# Patient Record
Sex: Male | Born: 1940
Health system: Southern US, Community
[De-identification: ages and names within clinical notes are randomized; demographics above are authoritative.]

## PROBLEM LIST (undated history)

## (undated) DIAGNOSIS — E785 Hyperlipidemia, unspecified: Secondary | ICD-10-CM

## (undated) DIAGNOSIS — I10 Essential (primary) hypertension: Secondary | ICD-10-CM

## (undated) DIAGNOSIS — E119 Type 2 diabetes mellitus without complications: Secondary | ICD-10-CM

## (undated) DIAGNOSIS — E559 Vitamin D deficiency, unspecified: Secondary | ICD-10-CM

## (undated) DIAGNOSIS — N200 Calculus of kidney: Secondary | ICD-10-CM

## (undated) DIAGNOSIS — H269 Unspecified cataract: Secondary | ICD-10-CM

## (undated) DIAGNOSIS — K8020 Calculus of gallbladder without cholecystitis without obstruction: Secondary | ICD-10-CM

## (undated) DIAGNOSIS — N400 Enlarged prostate without lower urinary tract symptoms: Secondary | ICD-10-CM

## (undated) DIAGNOSIS — M1990 Unspecified osteoarthritis, unspecified site: Secondary | ICD-10-CM

## (undated) HISTORY — DX: Hyperlipidemia, unspecified: E78.5

## (undated) HISTORY — PX: CATARACT EXTRACTION, BILATERAL: SHX1313

## (undated) HISTORY — DX: Type 2 diabetes mellitus without complications: E11.9

## (undated) HISTORY — DX: Unspecified cataract: H26.9

## (undated) HISTORY — DX: Calculus of gallbladder without cholecystitis without obstruction: K80.20

## (undated) HISTORY — DX: Essential (primary) hypertension: I10

## (undated) HISTORY — DX: Benign prostatic hyperplasia without lower urinary tract symptoms: N40.0

## (undated) HISTORY — DX: Unspecified osteoarthritis, unspecified site: M19.90

## (undated) HISTORY — DX: Vitamin D deficiency, unspecified: E55.9

---

## 2004-04-03 ENCOUNTER — Ambulatory Visit (HOSPITAL_COMMUNITY): Admission: RE | Admit: 2004-04-03 | Discharge: 2004-04-03 | Payer: Self-pay | Admitting: Family Medicine

## 2004-05-01 ENCOUNTER — Ambulatory Visit (HOSPITAL_BASED_OUTPATIENT_CLINIC_OR_DEPARTMENT_OTHER): Admission: RE | Admit: 2004-05-01 | Discharge: 2004-05-01 | Payer: Self-pay | Admitting: Orthopaedic Surgery

## 2004-05-01 ENCOUNTER — Ambulatory Visit (HOSPITAL_COMMUNITY): Admission: RE | Admit: 2004-05-01 | Discharge: 2004-05-01 | Payer: Self-pay | Admitting: Orthopaedic Surgery

## 2005-06-25 ENCOUNTER — Encounter: Admission: RE | Admit: 2005-06-25 | Discharge: 2005-07-18 | Payer: Self-pay | Admitting: Orthopaedic Surgery

## 2006-05-13 HISTORY — PX: FOOT SURGERY: SHX648

## 2010-12-03 ENCOUNTER — Encounter (INDEPENDENT_AMBULATORY_CARE_PROVIDER_SITE_OTHER): Payer: Self-pay | Admitting: Ophthalmology

## 2010-12-14 ENCOUNTER — Encounter (INDEPENDENT_AMBULATORY_CARE_PROVIDER_SITE_OTHER): Payer: Medicare Other | Admitting: Ophthalmology

## 2010-12-14 DIAGNOSIS — E11311 Type 2 diabetes mellitus with unspecified diabetic retinopathy with macular edema: Secondary | ICD-10-CM

## 2010-12-14 DIAGNOSIS — E11359 Type 2 diabetes mellitus with proliferative diabetic retinopathy without macular edema: Secondary | ICD-10-CM

## 2010-12-14 DIAGNOSIS — H43819 Vitreous degeneration, unspecified eye: Secondary | ICD-10-CM

## 2011-01-25 ENCOUNTER — Encounter (INDEPENDENT_AMBULATORY_CARE_PROVIDER_SITE_OTHER): Payer: Medicare Other | Admitting: Ophthalmology

## 2011-01-25 DIAGNOSIS — E11311 Type 2 diabetes mellitus with unspecified diabetic retinopathy with macular edema: Secondary | ICD-10-CM

## 2011-01-25 DIAGNOSIS — H43819 Vitreous degeneration, unspecified eye: Secondary | ICD-10-CM

## 2011-01-25 DIAGNOSIS — E11359 Type 2 diabetes mellitus with proliferative diabetic retinopathy without macular edema: Secondary | ICD-10-CM

## 2011-03-08 ENCOUNTER — Encounter (INDEPENDENT_AMBULATORY_CARE_PROVIDER_SITE_OTHER): Payer: Medicare Other | Admitting: Ophthalmology

## 2011-03-12 ENCOUNTER — Encounter (INDEPENDENT_AMBULATORY_CARE_PROVIDER_SITE_OTHER): Payer: Medicare Other | Admitting: Ophthalmology

## 2011-03-12 DIAGNOSIS — H43819 Vitreous degeneration, unspecified eye: Secondary | ICD-10-CM

## 2011-03-12 DIAGNOSIS — E11359 Type 2 diabetes mellitus with proliferative diabetic retinopathy without macular edema: Secondary | ICD-10-CM

## 2011-03-12 DIAGNOSIS — E11311 Type 2 diabetes mellitus with unspecified diabetic retinopathy with macular edema: Secondary | ICD-10-CM

## 2011-03-13 ENCOUNTER — Encounter (INDEPENDENT_AMBULATORY_CARE_PROVIDER_SITE_OTHER): Payer: Medicare Other | Admitting: Ophthalmology

## 2011-04-15 ENCOUNTER — Encounter (INDEPENDENT_AMBULATORY_CARE_PROVIDER_SITE_OTHER): Payer: Medicare Other | Admitting: Ophthalmology

## 2011-04-15 DIAGNOSIS — H43819 Vitreous degeneration, unspecified eye: Secondary | ICD-10-CM

## 2011-04-15 DIAGNOSIS — E1139 Type 2 diabetes mellitus with other diabetic ophthalmic complication: Secondary | ICD-10-CM

## 2011-04-15 DIAGNOSIS — E11311 Type 2 diabetes mellitus with unspecified diabetic retinopathy with macular edema: Secondary | ICD-10-CM

## 2011-04-15 DIAGNOSIS — E11359 Type 2 diabetes mellitus with proliferative diabetic retinopathy without macular edema: Secondary | ICD-10-CM

## 2011-04-15 DIAGNOSIS — E1165 Type 2 diabetes mellitus with hyperglycemia: Secondary | ICD-10-CM

## 2011-05-27 ENCOUNTER — Encounter (INDEPENDENT_AMBULATORY_CARE_PROVIDER_SITE_OTHER): Payer: Medicare Other | Admitting: Ophthalmology

## 2011-06-10 ENCOUNTER — Encounter (INDEPENDENT_AMBULATORY_CARE_PROVIDER_SITE_OTHER): Payer: Medicare Other | Admitting: Ophthalmology

## 2011-06-10 DIAGNOSIS — E1165 Type 2 diabetes mellitus with hyperglycemia: Secondary | ICD-10-CM

## 2011-06-10 DIAGNOSIS — E11359 Type 2 diabetes mellitus with proliferative diabetic retinopathy without macular edema: Secondary | ICD-10-CM

## 2011-06-10 DIAGNOSIS — H43819 Vitreous degeneration, unspecified eye: Secondary | ICD-10-CM

## 2011-06-10 DIAGNOSIS — E11311 Type 2 diabetes mellitus with unspecified diabetic retinopathy with macular edema: Secondary | ICD-10-CM

## 2011-06-10 DIAGNOSIS — E1139 Type 2 diabetes mellitus with other diabetic ophthalmic complication: Secondary | ICD-10-CM

## 2011-06-10 DIAGNOSIS — H2510 Age-related nuclear cataract, unspecified eye: Secondary | ICD-10-CM

## 2011-07-22 ENCOUNTER — Encounter (INDEPENDENT_AMBULATORY_CARE_PROVIDER_SITE_OTHER): Payer: Medicare Other | Admitting: Ophthalmology

## 2011-07-22 DIAGNOSIS — H43819 Vitreous degeneration, unspecified eye: Secondary | ICD-10-CM

## 2011-07-22 DIAGNOSIS — E11359 Type 2 diabetes mellitus with proliferative diabetic retinopathy without macular edema: Secondary | ICD-10-CM

## 2011-07-22 DIAGNOSIS — E11311 Type 2 diabetes mellitus with unspecified diabetic retinopathy with macular edema: Secondary | ICD-10-CM

## 2011-07-22 DIAGNOSIS — E1139 Type 2 diabetes mellitus with other diabetic ophthalmic complication: Secondary | ICD-10-CM

## 2011-08-26 ENCOUNTER — Encounter (INDEPENDENT_AMBULATORY_CARE_PROVIDER_SITE_OTHER): Payer: Medicare Other | Admitting: Ophthalmology

## 2011-08-26 DIAGNOSIS — E11311 Type 2 diabetes mellitus with unspecified diabetic retinopathy with macular edema: Secondary | ICD-10-CM

## 2011-08-26 DIAGNOSIS — E11359 Type 2 diabetes mellitus with proliferative diabetic retinopathy without macular edema: Secondary | ICD-10-CM

## 2011-08-26 DIAGNOSIS — E1165 Type 2 diabetes mellitus with hyperglycemia: Secondary | ICD-10-CM

## 2011-08-26 DIAGNOSIS — E1139 Type 2 diabetes mellitus with other diabetic ophthalmic complication: Secondary | ICD-10-CM

## 2011-08-26 DIAGNOSIS — H43819 Vitreous degeneration, unspecified eye: Secondary | ICD-10-CM

## 2011-08-26 DIAGNOSIS — H2510 Age-related nuclear cataract, unspecified eye: Secondary | ICD-10-CM

## 2011-10-14 ENCOUNTER — Encounter (INDEPENDENT_AMBULATORY_CARE_PROVIDER_SITE_OTHER): Payer: Medicare Other | Admitting: Ophthalmology

## 2011-10-14 DIAGNOSIS — E1165 Type 2 diabetes mellitus with hyperglycemia: Secondary | ICD-10-CM

## 2011-10-14 DIAGNOSIS — H43819 Vitreous degeneration, unspecified eye: Secondary | ICD-10-CM

## 2011-10-14 DIAGNOSIS — E11359 Type 2 diabetes mellitus with proliferative diabetic retinopathy without macular edema: Secondary | ICD-10-CM

## 2011-10-14 DIAGNOSIS — E1139 Type 2 diabetes mellitus with other diabetic ophthalmic complication: Secondary | ICD-10-CM

## 2011-11-25 ENCOUNTER — Encounter (INDEPENDENT_AMBULATORY_CARE_PROVIDER_SITE_OTHER): Payer: Medicare Other | Admitting: Ophthalmology

## 2011-11-25 DIAGNOSIS — E1139 Type 2 diabetes mellitus with other diabetic ophthalmic complication: Secondary | ICD-10-CM

## 2011-11-25 DIAGNOSIS — E11359 Type 2 diabetes mellitus with proliferative diabetic retinopathy without macular edema: Secondary | ICD-10-CM

## 2011-11-25 DIAGNOSIS — H35039 Hypertensive retinopathy, unspecified eye: Secondary | ICD-10-CM

## 2011-11-25 DIAGNOSIS — H3581 Retinal edema: Secondary | ICD-10-CM

## 2011-11-25 DIAGNOSIS — H43819 Vitreous degeneration, unspecified eye: Secondary | ICD-10-CM

## 2011-11-25 DIAGNOSIS — E1165 Type 2 diabetes mellitus with hyperglycemia: Secondary | ICD-10-CM

## 2011-11-25 DIAGNOSIS — I10 Essential (primary) hypertension: Secondary | ICD-10-CM

## 2012-01-27 ENCOUNTER — Encounter (INDEPENDENT_AMBULATORY_CARE_PROVIDER_SITE_OTHER): Payer: Medicare Other | Admitting: Ophthalmology

## 2012-01-27 DIAGNOSIS — H3581 Retinal edema: Secondary | ICD-10-CM

## 2012-01-27 DIAGNOSIS — H43819 Vitreous degeneration, unspecified eye: Secondary | ICD-10-CM

## 2012-01-27 DIAGNOSIS — H35039 Hypertensive retinopathy, unspecified eye: Secondary | ICD-10-CM

## 2012-01-27 DIAGNOSIS — E1139 Type 2 diabetes mellitus with other diabetic ophthalmic complication: Secondary | ICD-10-CM

## 2012-01-27 DIAGNOSIS — E11359 Type 2 diabetes mellitus with proliferative diabetic retinopathy without macular edema: Secondary | ICD-10-CM

## 2012-01-27 DIAGNOSIS — I10 Essential (primary) hypertension: Secondary | ICD-10-CM

## 2012-03-30 ENCOUNTER — Encounter (INDEPENDENT_AMBULATORY_CARE_PROVIDER_SITE_OTHER): Payer: Medicare Other | Admitting: Ophthalmology

## 2012-03-30 DIAGNOSIS — H43819 Vitreous degeneration, unspecified eye: Secondary | ICD-10-CM

## 2012-03-30 DIAGNOSIS — H35039 Hypertensive retinopathy, unspecified eye: Secondary | ICD-10-CM

## 2012-03-30 DIAGNOSIS — H3581 Retinal edema: Secondary | ICD-10-CM

## 2012-03-30 DIAGNOSIS — E1139 Type 2 diabetes mellitus with other diabetic ophthalmic complication: Secondary | ICD-10-CM

## 2012-03-30 DIAGNOSIS — I10 Essential (primary) hypertension: Secondary | ICD-10-CM

## 2012-03-30 DIAGNOSIS — E11359 Type 2 diabetes mellitus with proliferative diabetic retinopathy without macular edema: Secondary | ICD-10-CM

## 2012-06-02 ENCOUNTER — Encounter (INDEPENDENT_AMBULATORY_CARE_PROVIDER_SITE_OTHER): Payer: Medicare Other | Admitting: Ophthalmology

## 2012-06-02 DIAGNOSIS — H35039 Hypertensive retinopathy, unspecified eye: Secondary | ICD-10-CM

## 2012-06-02 DIAGNOSIS — I10 Essential (primary) hypertension: Secondary | ICD-10-CM

## 2012-06-02 DIAGNOSIS — E11359 Type 2 diabetes mellitus with proliferative diabetic retinopathy without macular edema: Secondary | ICD-10-CM

## 2012-06-02 DIAGNOSIS — E1139 Type 2 diabetes mellitus with other diabetic ophthalmic complication: Secondary | ICD-10-CM

## 2012-06-02 DIAGNOSIS — H43819 Vitreous degeneration, unspecified eye: Secondary | ICD-10-CM

## 2012-06-02 DIAGNOSIS — E1165 Type 2 diabetes mellitus with hyperglycemia: Secondary | ICD-10-CM

## 2012-06-02 DIAGNOSIS — H3581 Retinal edema: Secondary | ICD-10-CM

## 2012-06-30 ENCOUNTER — Encounter (INDEPENDENT_AMBULATORY_CARE_PROVIDER_SITE_OTHER): Payer: Medicare Other | Admitting: Ophthalmology

## 2012-06-30 DIAGNOSIS — I10 Essential (primary) hypertension: Secondary | ICD-10-CM

## 2012-06-30 DIAGNOSIS — H43819 Vitreous degeneration, unspecified eye: Secondary | ICD-10-CM

## 2012-06-30 DIAGNOSIS — E1139 Type 2 diabetes mellitus with other diabetic ophthalmic complication: Secondary | ICD-10-CM

## 2012-06-30 DIAGNOSIS — H35039 Hypertensive retinopathy, unspecified eye: Secondary | ICD-10-CM

## 2012-06-30 DIAGNOSIS — E11359 Type 2 diabetes mellitus with proliferative diabetic retinopathy without macular edema: Secondary | ICD-10-CM

## 2012-06-30 DIAGNOSIS — E11311 Type 2 diabetes mellitus with unspecified diabetic retinopathy with macular edema: Secondary | ICD-10-CM

## 2012-09-01 ENCOUNTER — Encounter (INDEPENDENT_AMBULATORY_CARE_PROVIDER_SITE_OTHER): Payer: Medicare Other | Admitting: Ophthalmology

## 2012-09-01 DIAGNOSIS — E11359 Type 2 diabetes mellitus with proliferative diabetic retinopathy without macular edema: Secondary | ICD-10-CM

## 2012-09-01 DIAGNOSIS — H43819 Vitreous degeneration, unspecified eye: Secondary | ICD-10-CM

## 2012-09-01 DIAGNOSIS — E11311 Type 2 diabetes mellitus with unspecified diabetic retinopathy with macular edema: Secondary | ICD-10-CM

## 2012-09-01 DIAGNOSIS — H35039 Hypertensive retinopathy, unspecified eye: Secondary | ICD-10-CM

## 2012-09-01 DIAGNOSIS — I10 Essential (primary) hypertension: Secondary | ICD-10-CM

## 2012-09-01 DIAGNOSIS — E1139 Type 2 diabetes mellitus with other diabetic ophthalmic complication: Secondary | ICD-10-CM

## 2012-10-13 ENCOUNTER — Encounter (INDEPENDENT_AMBULATORY_CARE_PROVIDER_SITE_OTHER): Payer: Medicare Other | Admitting: Ophthalmology

## 2012-10-13 DIAGNOSIS — E11319 Type 2 diabetes mellitus with unspecified diabetic retinopathy without macular edema: Secondary | ICD-10-CM

## 2012-10-13 DIAGNOSIS — E1139 Type 2 diabetes mellitus with other diabetic ophthalmic complication: Secondary | ICD-10-CM

## 2012-10-13 DIAGNOSIS — I10 Essential (primary) hypertension: Secondary | ICD-10-CM

## 2012-10-13 DIAGNOSIS — E11311 Type 2 diabetes mellitus with unspecified diabetic retinopathy with macular edema: Secondary | ICD-10-CM

## 2012-10-13 DIAGNOSIS — H35039 Hypertensive retinopathy, unspecified eye: Secondary | ICD-10-CM

## 2012-10-13 DIAGNOSIS — H43819 Vitreous degeneration, unspecified eye: Secondary | ICD-10-CM

## 2012-11-20 ENCOUNTER — Encounter (INDEPENDENT_AMBULATORY_CARE_PROVIDER_SITE_OTHER): Payer: Medicare Other | Admitting: Ophthalmology

## 2012-11-20 DIAGNOSIS — E11359 Type 2 diabetes mellitus with proliferative diabetic retinopathy without macular edema: Secondary | ICD-10-CM

## 2012-11-20 DIAGNOSIS — E11311 Type 2 diabetes mellitus with unspecified diabetic retinopathy with macular edema: Secondary | ICD-10-CM

## 2012-11-20 DIAGNOSIS — H35039 Hypertensive retinopathy, unspecified eye: Secondary | ICD-10-CM

## 2012-11-20 DIAGNOSIS — H43819 Vitreous degeneration, unspecified eye: Secondary | ICD-10-CM

## 2012-11-20 DIAGNOSIS — I10 Essential (primary) hypertension: Secondary | ICD-10-CM

## 2012-11-20 DIAGNOSIS — E1139 Type 2 diabetes mellitus with other diabetic ophthalmic complication: Secondary | ICD-10-CM

## 2013-01-01 ENCOUNTER — Encounter (INDEPENDENT_AMBULATORY_CARE_PROVIDER_SITE_OTHER): Payer: Medicare Other | Admitting: Ophthalmology

## 2013-01-01 DIAGNOSIS — E11359 Type 2 diabetes mellitus with proliferative diabetic retinopathy without macular edema: Secondary | ICD-10-CM

## 2013-01-01 DIAGNOSIS — H43819 Vitreous degeneration, unspecified eye: Secondary | ICD-10-CM

## 2013-01-01 DIAGNOSIS — E11311 Type 2 diabetes mellitus with unspecified diabetic retinopathy with macular edema: Secondary | ICD-10-CM

## 2013-01-01 DIAGNOSIS — I10 Essential (primary) hypertension: Secondary | ICD-10-CM

## 2013-01-01 DIAGNOSIS — H35039 Hypertensive retinopathy, unspecified eye: Secondary | ICD-10-CM

## 2013-01-01 DIAGNOSIS — E1139 Type 2 diabetes mellitus with other diabetic ophthalmic complication: Secondary | ICD-10-CM

## 2013-01-05 ENCOUNTER — Encounter: Payer: Self-pay | Admitting: Family Medicine

## 2013-01-05 ENCOUNTER — Ambulatory Visit (INDEPENDENT_AMBULATORY_CARE_PROVIDER_SITE_OTHER): Payer: Medicare Other | Admitting: Family Medicine

## 2013-01-05 VITALS — BP 132/77 | HR 85 | Temp 97.9°F | Wt 162.4 lb

## 2013-01-05 DIAGNOSIS — N400 Enlarged prostate without lower urinary tract symptoms: Secondary | ICD-10-CM

## 2013-01-05 DIAGNOSIS — E11319 Type 2 diabetes mellitus with unspecified diabetic retinopathy without macular edema: Secondary | ICD-10-CM | POA: Insufficient documentation

## 2013-01-05 DIAGNOSIS — E785 Hyperlipidemia, unspecified: Secondary | ICD-10-CM

## 2013-01-05 DIAGNOSIS — E559 Vitamin D deficiency, unspecified: Secondary | ICD-10-CM

## 2013-01-05 DIAGNOSIS — E119 Type 2 diabetes mellitus without complications: Secondary | ICD-10-CM

## 2013-01-05 DIAGNOSIS — I10 Essential (primary) hypertension: Secondary | ICD-10-CM

## 2013-01-05 MED ORDER — AMLODIPINE BESYLATE 5 MG PO TABS: 5.0000 mg | ORAL_TABLET | Freq: Every day | ORAL | Status: DC

## 2013-01-05 NOTE — Progress Notes (Signed)
Patient ID: Jared Norman, male   DOB: 10/03/1940, 72 y.o.   MRN: [id number] SUBJECTIVE: CC: Chief Complaint  Patient presents with  . Medication Refill    refill amlodipine 5mg . wants to know if should continue baby asa     HPI:  Used to see Dr Lucianne Muss.  Patient is here for follow up of Diabetes Mellitus: Symptoms evaluated: Denies Nocturia ,Denies Urinary Frequency , denies Blurred vision ,deniesDizziness,denies.Dysuria,denies paresthesias, denies extremity pain or ulcers.Marland Kitchendenies chest pain. has had an annual eye exam. do check the feet. Does check CBGs. Average CBG: good. Denies episodes of hypoglycemia. Does have an emergency hypoglycemic plan. admits toCompliance with medications. Denies Problems with medications.  Past Medical History  Diagnosis Date  . BPH (benign prostatic hyperplasia)   . Vitamin D deficiency   . Diabetes mellitus without complication   . Hyperlipidemia   . Hypertension    No past surgical history on file. History   Social History  . Marital Status: Married    Spouse Name: N/A    Number of Children: N/A  . Years of Education: N/A   Occupational History  . Not on file.   Social History Main Topics  . Smoking status: Never Smoker   . Smokeless tobacco: Not on file  . Alcohol Use: Not on file  . Drug Use: Not on file  . Sexual Activity: Not on file   Other Topics Concern  . Not on file   Social History Narrative  . No narrative on file   No family history on file. No current outpatient prescriptions on file prior to visit.   No current facility-administered medications on file prior to visit.   No Known Allergies  There is no immunization history on file for this patient. Prior to Admission medications   Medication Sig Start Date End Date Taking? Authorizing Provider  amLODipine (NORVASC) 5 MG tablet Take 1 tablet (5 mg total) by mouth daily. 01/05/13  Yes Ileana Ladd, MD  BESIVANCE 0.6 % SUSP  12/31/12  Yes Historical Provider,  MD  Cholecalciferol (VITAMIN D-3) 5000 UNITS TABS Take 1 tablet by mouth daily.   Yes Historical Provider, MD  COMBIGAN 0.2-0.5 % ophthalmic solution  12/31/12  Yes Historical Provider, MD  finasteride (PROSCAR) 5 MG tablet Take 5 mg by mouth daily.   Yes Historical Provider, MD  lisinopril (PRINIVIL,ZESTRIL) 40 MG tablet Take 1/2 pill day   Yes Historical Provider, MD  magnesium gluconate (MAGONATE) 500 MG tablet Take 500 mg by mouth daily.   Yes Historical Provider, MD  metFORMIN (GLUCOPHAGE) 1000 MG tablet Take 1,000 mg by mouth daily with breakfast.   Yes Historical Provider, MD  simvastatin (ZOCOR) 40 MG tablet Take 1/2 pill at bedtime   Yes Historical Provider, MD    ROS: As above in the HPI. All other systems are stable or negative.  OBJECTIVE: APPEARANCE:  Patient in no acute distress.The patient appeared well nourished and normally developed. Acyanotic. Waist: VITAL SIGNS:BP 132/77  Pulse 85  Temp(Src) 97.9 F (36.6 C) (Oral)  Wt 162 lb 6.4 oz (73.664 kg)  WM  SKIN: warm and  Dry without overt rashes, tattoos and scars  HEAD and Neck: without JVD, Head and scalp: normal Eyes:No scleral icterus. Fundi normal, eye movements normal. Ears: Auricle normal, canal normal, Tympanic membranes normal, insufflation normal. Nose: normal Throat: normal Neck & thyroid: normal  CHEST & LUNGS: Chest wall: normal Lungs: Clear  CVS: Reveals the PMI to be normally located. Regular rhythm,  First and Second Heart sounds are normal,  absence of murmurs, rubs or gallops. Peripheral vasculature: Radial pulses: normal Dorsal pedis pulses: normal Posterior pulses: normal  ABDOMEN:  Appearance: normal Benign, no organomegaly, no masses, no Abdominal Aortic enlargement. No Guarding , no rebound. No Bruits. Bowel sounds: normal  RECTAL: N/A GU: N/A  EXTREMETIES: nonedematous.  MUSCULOSKELETAL:  Spine: mild kyphosis Joints: intact  NEUROLOGIC: oriented to time,place and  person; nonfocal. Strength is normal Sensory is normal Reflexes are normal Cranial Nerves are normal.  ASSESSMENT: HTN (hypertension) - Plan: amLODipine (NORVASC) 5 MG tablet  BPH (benign prostatic hyperplasia)  Diabetes mellitus without complication  Hyperlipidemia  Hypertension  Vitamin D deficiency   PLAN: Meds ordered this encounter  Medications  . BESIVANCE 0.6 % SUSP    Sig:   . COMBIGAN 0.2-0.5 % ophthalmic solution    Sig:   . DISCONTD: amLODipine (NORVASC) 5 MG tablet    Sig: Take 5 mg by mouth daily.   . finasteride (PROSCAR) 5 MG tablet    Sig: Take 5 mg by mouth daily.  Marland Kitchen lisinopril (PRINIVIL,ZESTRIL) 40 MG tablet    Sig: Take 1/2 pill day  . simvastatin (ZOCOR) 40 MG tablet    Sig: Take 1/2 pill at bedtime  . metFORMIN (GLUCOPHAGE) 1000 MG tablet    Sig: Take 1,000 mg by mouth daily with breakfast.  . Cholecalciferol (VITAMIN D-3) 5000 UNITS TABS    Sig: Take 1 tablet by mouth daily.  . magnesium gluconate (MAGONATE) 500 MG tablet    Sig: Take 500 mg by mouth daily.  Marland Kitchen amLODipine (NORVASC) 5 MG tablet    Sig: Take 1 tablet (5 mg total) by mouth daily.    Dispense:  90 tablet    Refill:  3    Reviewed his  Health status and wellness issues. He had labs done 2 months ago and was supposed to be good.  Return in about 2 months (around 03/07/2013) for Recheck medical problems.  Labs next visit and flushot at that time.  Sally Menard P. [place]esto Charon, M.D.

## 2013-01-06 ENCOUNTER — Ambulatory Visit: Payer: Medicare Other | Admitting: Endocrinology

## 2013-01-15 ENCOUNTER — Other Ambulatory Visit: Payer: Self-pay | Admitting: *Deleted

## 2013-01-15 MED ORDER — SIMVASTATIN 40 MG PO TABS: 40.0000 mg | ORAL_TABLET | Freq: Every day | ORAL | Status: DC

## 2013-01-15 NOTE — Telephone Encounter (Signed)
Here last week, you ordered all meds but this one, i dont see any labs

## 2013-02-26 ENCOUNTER — Ambulatory Visit: Payer: Medicare Other | Admitting: Family Medicine

## 2013-02-26 ENCOUNTER — Encounter: Payer: Self-pay | Admitting: Family Medicine

## 2013-02-26 ENCOUNTER — Ambulatory Visit (INDEPENDENT_AMBULATORY_CARE_PROVIDER_SITE_OTHER): Payer: Medicare Other | Admitting: Family Medicine

## 2013-02-26 VITALS — BP 144/86 | HR 84 | Temp 97.4°F | Ht 66.0 in | Wt 164.4 lb

## 2013-02-26 DIAGNOSIS — I10 Essential (primary) hypertension: Secondary | ICD-10-CM

## 2013-02-26 DIAGNOSIS — E785 Hyperlipidemia, unspecified: Secondary | ICD-10-CM

## 2013-02-26 DIAGNOSIS — N400 Enlarged prostate without lower urinary tract symptoms: Secondary | ICD-10-CM

## 2013-02-26 DIAGNOSIS — E119 Type 2 diabetes mellitus without complications: Secondary | ICD-10-CM

## 2013-02-26 DIAGNOSIS — Z23 Encounter for immunization: Secondary | ICD-10-CM | POA: Insufficient documentation

## 2013-02-26 DIAGNOSIS — E559 Vitamin D deficiency, unspecified: Secondary | ICD-10-CM

## 2013-02-26 LAB — POCT GLYCOSYLATED HEMOGLOBIN (HGB A1C): Hemoglobin A1C: 6

## 2013-02-26 LAB — POCT UA - MICROALBUMIN: Microalbumin Ur, POC: POSITIVE mg/L

## 2013-02-26 NOTE — Patient Instructions (Signed)
      Dr Vasti Yagi's Recommendations  For nutrition information, I recommend books:  1).Eat to Live by Dr Joel Fuhrman. 2).Prevent and Reverse Heart Disease by Dr Caldwell Esselstyn. 3) Dr Neal Barnard's Book:  Program to Reverse Diabetes  Exercise recommendations are:  If unable to walk, then the patient can exercise in a chair 3 times a day. By flapping arms like a bird gently and raising legs outwards to the front.  If ambulatory, the patient can go for walks for 30 minutes 3 times a week. Then increase the intensity and duration as tolerated.  Goal is to try to attain exercise frequency to 5 times a week.  If applicable: Best to perform resistance exercises (machines or weights) 2 days a week and cardio type exercises 3 days per week.  

## 2013-02-26 NOTE — Progress Notes (Signed)
SUBJECTIVE: CC: Chief Complaint  Patient presents with  . Follow-up    2 month ckup    HPI: Patient is here for follow up of Diabetes Mellitus/HTN/HLD/Vitamin D Def/: Symptoms evaluated: Denies Nocturia ,Denies Urinary Frequency , denies Blurred vision ,deniesDizziness,denies.Dysuria,denies paresthesias, denies extremity pain or ulcers.Marland Kitchendenies chest pain. has had an annual eye exam. do check the feet. Does check CBGs. Average CBG:101 this am Denies episodes of hypoglycemia. Does have an emergency hypoglycemic plan. admits toCompliance with medications. Denies Problems with medications.  Past Medical History  Diagnosis Date  . BPH (benign prostatic hyperplasia)   . Vitamin D deficiency   . Diabetes mellitus without complication   . Hyperlipidemia   . Hypertension    Past Surgical History  Procedure Laterality Date  . Right foot surgery Right 2008    tendon rupture   History   Social History  . Marital Status: Married    Spouse Name: N/A    Number of Children: N/A  . Years of Education: N/A   Occupational History  . Not on file.   Social History Main Topics  . Smoking status: Never Smoker   . Smokeless tobacco: Not on file  . Alcohol Use: No  . Drug Use: No  . Sexual Activity: Not on file   Other Topics Concern  . Not on file   Social History Narrative  . No narrative on file   No family history on file. Current Outpatient Prescriptions on File Prior to Visit  Medication Sig Dispense Refill  . finasteride (PROSCAR) 5 MG tablet Take 5 mg by mouth daily.      Marland Kitchen amLODipine (NORVASC) 5 MG tablet Take 1 tablet (5 mg total) by mouth daily.  90 tablet  3  . BESIVANCE 0.6 % SUSP       . Cholecalciferol (VITAMIN D-3) 5000 UNITS TABS Take 1 tablet by mouth daily.      . COMBIGAN 0.2-0.5 % ophthalmic solution       . lisinopril (PRINIVIL,ZESTRIL) 40 MG tablet Take 1/2 pill day      . magnesium gluconate (MAGONATE) 500 MG tablet Take 500 mg by mouth daily.      .  metFORMIN (GLUCOPHAGE) 1000 MG tablet Take 1,000 mg by mouth daily with breakfast.      . simvastatin (ZOCOR) 40 MG tablet Take 1 tablet (40 mg total) by mouth at bedtime. Take 1/2 pill at bedtime  90 tablet  0   No current facility-administered medications on file prior to visit.   No Known Allergies Immunization History  Administered Date(s) Administered  . Influenza,inj,Quad PF,36+ Mos 02/26/2013   Prior to Admission medications   Medication Sig Start Date End Date Taking? Authorizing Provider  amLODipine (NORVASC) 5 MG tablet Take 1 tablet (5 mg total) by mouth daily. 01/05/13   Ileana Ladd, MD  BESIVANCE 0.6 % SUSP  12/31/12   Historical Provider, MD  Cholecalciferol (VITAMIN D-3) 5000 UNITS TABS Take 1 tablet by mouth daily.    Historical Provider, MD  COMBIGAN 0.2-0.5 % ophthalmic solution  12/31/12   Historical Provider, MD  finasteride (PROSCAR) 5 MG tablet Take 5 mg by mouth daily.    Historical Provider, MD  lisinopril (PRINIVIL,ZESTRIL) 40 MG tablet Take 1/2 pill day    Historical Provider, MD  magnesium gluconate (MAGONATE) 500 MG tablet Take 500 mg by mouth daily.    Historical Provider, MD  metFORMIN (GLUCOPHAGE) 1000 MG tablet Take 1,000 mg by mouth daily with breakfast.  Historical Provider, MD  simvastatin (ZOCOR) 40 MG tablet Take 1 tablet (40 mg total) by mouth at bedtime. Take 1/2 pill at bedtime 01/15/13   Ileana Ladd, MD     ROS: As above in the HPI. All other systems are stable or negative.  OBJECTIVE: APPEARANCE:  Patient in no acute distress.The patient appeared well nourished and normally developed. Acyanotic. Waist: VITAL SIGNS:BP 144/86  Pulse 84  Temp(Src) 97.4 F (36.3 C) (Oral)  Ht 5\' 6"  (1.676 m)  Wt 164 lb 6.4 oz (74.571 kg)  BMI 26.55 kg/m2  WM  SKIN: warm and  Dry without overt rashes, tattoos and scars  HEAD and Neck: without JVD, Head and scalp: normal Eyes:No scleral icterus. Fundi normal, eye movements normal. Ears: Auricle  normal, canal normal, Tympanic membranes normal, insufflation normal. Nose: normal Throat: normal Neck & thyroid: normal  CHEST & LUNGS: Chest wall: normal Lungs: Clear  CVS: Reveals the PMI to be normally located. Regular rhythm, First and Second Heart sounds are normal,  absence of murmurs, rubs or gallops. Peripheral vasculature: Radial pulses: normal Dorsal pedis pulses: normal Posterior pulses: normal  ABDOMEN:  Appearance: normal Benign, no organomegaly, no masses, no Abdominal Aortic enlargement. No Guarding , no rebound. No Bruits. Bowel sounds: normal  RECTAL: N/A GU: N/A  EXTREMETIES: nonedematous.  MUSCULOSKELETAL:  Spine: normal Joints: intact  NEUROLOGIC: oriented to time,place and person; nonfocal. Strength is normal Sensory is normal Reflexes are normal Cranial Nerves are normal.  ASSESSMENT: Diabetes mellitus without complication - Plan: POCT UA - Microalbumin, POCT glycosylated hemoglobin (Hb A1C), CMP14+EGFR, Microalbumin, urine  Hypertension  Hyperlipidemia - Plan: CMP14+EGFR, Lipid panel  Vitamin D deficiency - Plan: Vit D  25 hydroxy (rtn osteoporosis monitoring)  Need for prophylactic vaccination and inoculation against influenza  BPH (benign prostatic hyperplasia)  PLAN:      Dr Woodroe Mode Recommendations  For nutrition information, I recommend books:  1).Eat to Live by Dr Monico Hoar. 2).Prevent and Reverse Heart Disease by Dr Suzzette Righter. 3) Dr Katherina Right Book:  Program to Reverse Diabetes  Exercise recommendations are:  If unable to walk, then the patient can exercise in a chair 3 times a day. By flapping arms like a bird gently and raising legs outwards to the front.  If ambulatory, the patient can go for walks for 30 minutes 3 times a week. Then increase the intensity and duration as tolerated.  Goal is to try to attain exercise frequency to 5 times a week.  If applicable: Best to perform resistance  exercises (machines or weights) 2 days a week and cardio type exercises 3 days per week.  Orders Placed This Encounter  Procedures  . CMP14+EGFR  . Lipid panel  . Vit D  25 hydroxy (rtn osteoporosis monitoring)  . Microalbumin, urine  . POCT UA - Microalbumin  . POCT glycosylated hemoglobin (Hb A1C)   Meds ordered this encounter  Medications  . DISCONTD: finasteride (PROSCAR) 5 MG tablet    Sig:   . aspirin 81 MG tablet    Sig: Take 81 mg by mouth daily. One tablet three times a week.   Medications Discontinued During This Encounter  Medication Reason  . finasteride (PROSCAR) 5 MG tablet Duplicate   Return in about 3 months (around 05/29/2013) for Recheck medical problems, recheck BP.  Tykeem Lanzer P. [place] Charon, M.D.

## 2013-02-27 LAB — CMP14+EGFR
ALT: 17 IU/L (ref 0–44)
AST: 16 IU/L (ref 0–40)
Albumin/Globulin Ratio: 2 (ref 1.1–2.5)
Albumin: 4.5 g/dL (ref 3.5–4.8)
Alkaline Phosphatase: 73 IU/L (ref 39–117)
BUN/Creatinine Ratio: 16 (ref 10–22)
BUN: 15 mg/dL (ref 8–27)
CO2: 26 mmol/L (ref 18–29)
Calcium: 9.8 mg/dL (ref 8.6–10.2)
Chloride: 100 mmol/L (ref 97–108)
Creatinine, Ser: 0.96 mg/dL (ref 0.76–1.27)
GFR calc Af Amer: 90 mL/min/{1.73_m2} (ref >59)
GFR calc non Af Amer: 78 mL/min/{1.73_m2} (ref >59)
Globulin, Total: 2.2 g/dL (ref 1.5–4.5)
Glucose: 99 mg/dL (ref 65–99)
Potassium: 5.1 mmol/L (ref 3.5–5.2)
Sodium: 143 mmol/L (ref 134–144)
Total Bilirubin: 1 mg/dL (ref 0.0–1.2)
Total Protein: 6.7 g/dL (ref 6.0–8.5)

## 2013-02-27 LAB — LIPID PANEL
Chol/HDL Ratio: 2.8 ratio units (ref 0.0–5.0)
Cholesterol, Total: 130 mg/dL (ref 100–199)
HDL: 46 mg/dL (ref >39)
LDL Calculated: 63 mg/dL (ref 0–99)
Triglycerides: 104 mg/dL (ref 0–149)
VLDL Cholesterol Cal: 21 mg/dL (ref 5–40)

## 2013-02-27 LAB — VITAMIN D 25 HYDROXY (VIT D DEFICIENCY, FRACTURES): Vit D, 25-Hydroxy: 65.1 ng/mL (ref 30.0–100.0)

## 2013-02-27 LAB — MICROALBUMIN, URINE: Microalbumin, Urine: 32 ug/mL — ABNORMAL HIGH (ref 0.0–17.0)

## 2013-02-27 NOTE — Progress Notes (Signed)
Quick Note:  Call Patient Labs abnormal: The microalbumin is a little Elevated. The BP with DM may affect it. He is to return in 3 months with BP measurements.  Recommendations: RTC in 3 months as planned. Monitor BP and bring in 3 monhs.   ______

## 2013-03-05 ENCOUNTER — Encounter (INDEPENDENT_AMBULATORY_CARE_PROVIDER_SITE_OTHER): Payer: Medicare Other | Admitting: Ophthalmology

## 2013-03-05 DIAGNOSIS — H43819 Vitreous degeneration, unspecified eye: Secondary | ICD-10-CM

## 2013-03-05 DIAGNOSIS — E1139 Type 2 diabetes mellitus with other diabetic ophthalmic complication: Secondary | ICD-10-CM

## 2013-03-05 DIAGNOSIS — E11359 Type 2 diabetes mellitus with proliferative diabetic retinopathy without macular edema: Secondary | ICD-10-CM

## 2013-03-05 DIAGNOSIS — H35039 Hypertensive retinopathy, unspecified eye: Secondary | ICD-10-CM

## 2013-03-05 DIAGNOSIS — I10 Essential (primary) hypertension: Secondary | ICD-10-CM

## 2013-03-05 DIAGNOSIS — E11311 Type 2 diabetes mellitus with unspecified diabetic retinopathy with macular edema: Secondary | ICD-10-CM

## 2013-04-12 ENCOUNTER — Other Ambulatory Visit: Payer: Self-pay | Admitting: *Deleted

## 2013-04-12 MED ORDER — LISINOPRIL 40 MG PO TABS: ORAL_TABLET | ORAL | Status: DC

## 2013-04-16 ENCOUNTER — Encounter (INDEPENDENT_AMBULATORY_CARE_PROVIDER_SITE_OTHER): Payer: Medicare Other | Admitting: Ophthalmology

## 2013-04-16 DIAGNOSIS — H43819 Vitreous degeneration, unspecified eye: Secondary | ICD-10-CM

## 2013-04-16 DIAGNOSIS — H35039 Hypertensive retinopathy, unspecified eye: Secondary | ICD-10-CM

## 2013-04-16 DIAGNOSIS — E11311 Type 2 diabetes mellitus with unspecified diabetic retinopathy with macular edema: Secondary | ICD-10-CM

## 2013-04-16 DIAGNOSIS — E1139 Type 2 diabetes mellitus with other diabetic ophthalmic complication: Secondary | ICD-10-CM

## 2013-04-16 DIAGNOSIS — I10 Essential (primary) hypertension: Secondary | ICD-10-CM

## 2013-04-16 DIAGNOSIS — E11359 Type 2 diabetes mellitus with proliferative diabetic retinopathy without macular edema: Secondary | ICD-10-CM

## 2013-04-23 ENCOUNTER — Other Ambulatory Visit: Payer: Self-pay | Admitting: Family Medicine

## 2013-04-28 ENCOUNTER — Telehealth: Payer: Self-pay | Admitting: Family Medicine

## 2013-04-29 MED ORDER — FINASTERIDE 5 MG PO TABS: 5.0000 mg | ORAL_TABLET | Freq: Every day | ORAL | Status: DC

## 2013-04-29 NOTE — Telephone Encounter (Signed)
done

## 2013-04-30 ENCOUNTER — Telehealth: Payer: Self-pay | Admitting: *Deleted

## 2013-05-05 NOTE — Telephone Encounter (Signed)
done

## 2013-05-14 ENCOUNTER — Other Ambulatory Visit: Payer: Self-pay | Admitting: *Deleted

## 2013-05-14 MED ORDER — LISINOPRIL 40 MG PO TABS: ORAL_TABLET | ORAL | Status: DC

## 2013-05-17 ENCOUNTER — Other Ambulatory Visit: Payer: Self-pay

## 2013-05-17 MED ORDER — FINASTERIDE 5 MG PO TABS: 5.0000 mg | ORAL_TABLET | Freq: Every day | ORAL | Status: DC

## 2013-05-27 ENCOUNTER — Other Ambulatory Visit: Payer: Self-pay | Admitting: *Deleted

## 2013-05-28 ENCOUNTER — Other Ambulatory Visit: Payer: Self-pay | Admitting: *Deleted

## 2013-05-28 ENCOUNTER — Encounter (INDEPENDENT_AMBULATORY_CARE_PROVIDER_SITE_OTHER): Payer: Medicare HMO | Admitting: Ophthalmology

## 2013-05-28 DIAGNOSIS — E11359 Type 2 diabetes mellitus with proliferative diabetic retinopathy without macular edema: Secondary | ICD-10-CM

## 2013-05-28 DIAGNOSIS — I10 Essential (primary) hypertension: Secondary | ICD-10-CM

## 2013-05-28 DIAGNOSIS — H35039 Hypertensive retinopathy, unspecified eye: Secondary | ICD-10-CM

## 2013-05-28 DIAGNOSIS — E11311 Type 2 diabetes mellitus with unspecified diabetic retinopathy with macular edema: Secondary | ICD-10-CM

## 2013-05-28 DIAGNOSIS — H43819 Vitreous degeneration, unspecified eye: Secondary | ICD-10-CM

## 2013-05-28 DIAGNOSIS — E1165 Type 2 diabetes mellitus with hyperglycemia: Secondary | ICD-10-CM

## 2013-05-28 DIAGNOSIS — E1139 Type 2 diabetes mellitus with other diabetic ophthalmic complication: Secondary | ICD-10-CM

## 2013-05-28 MED ORDER — METFORMIN HCL 1000 MG PO TABS: 1000.0000 mg | ORAL_TABLET | Freq: Every day | ORAL | Status: DC

## 2013-05-28 NOTE — Telephone Encounter (Signed)
Call patient : Prescription refilled & sent to pharmacy in [place]. Patient is on one tablet daily. Margeart Allender P. Jacelyn Grip, M.D.

## 2013-05-28 NOTE — Telephone Encounter (Signed)
Received request from [facility] to refill Metformin 1000mg . Our list in computer says takes QD but pharmacy states that they take BID. Please advise

## 2013-06-01 ENCOUNTER — Ambulatory Visit (INDEPENDENT_AMBULATORY_CARE_PROVIDER_SITE_OTHER): Payer: Medicare HMO | Admitting: Family Medicine

## 2013-06-01 ENCOUNTER — Encounter: Payer: Self-pay | Admitting: Family Medicine

## 2013-06-01 VITALS — BP 130/83 | HR 74 | Temp 97.7°F | Ht 66.0 in | Wt 160.6 lb

## 2013-06-01 DIAGNOSIS — E785 Hyperlipidemia, unspecified: Secondary | ICD-10-CM

## 2013-06-01 DIAGNOSIS — N400 Enlarged prostate without lower urinary tract symptoms: Secondary | ICD-10-CM

## 2013-06-01 DIAGNOSIS — E119 Type 2 diabetes mellitus without complications: Secondary | ICD-10-CM

## 2013-06-01 DIAGNOSIS — E559 Vitamin D deficiency, unspecified: Secondary | ICD-10-CM

## 2013-06-01 DIAGNOSIS — N390 Urinary tract infection, site not specified: Secondary | ICD-10-CM

## 2013-06-01 DIAGNOSIS — I10 Essential (primary) hypertension: Secondary | ICD-10-CM

## 2013-06-01 LAB — POCT URINALYSIS DIPSTICK
Bilirubin, UA: NEGATIVE
Glucose, UA: NEGATIVE
Ketones, UA: NEGATIVE
Nitrite, UA: POSITIVE
Protein, UA: NEGATIVE
Spec Grav, UA: 1.01
Urobilinogen, UA: NEGATIVE
pH, UA: 7.5

## 2013-06-01 LAB — POCT UA - MICROSCOPIC ONLY
Casts, Ur, LPF, POC: NEGATIVE
Crystals, Ur, HPF, POC: NEGATIVE
Mucus, UA: NEGATIVE
Yeast, UA: NEGATIVE

## 2013-06-01 LAB — POCT UA - MICROALBUMIN: Microalbumin Ur, POC: 20 mg/L

## 2013-06-01 LAB — POCT GLYCOSYLATED HEMOGLOBIN (HGB A1C): Hemoglobin A1C: 6.3

## 2013-06-01 MED ORDER — FINASTERIDE 5 MG PO TABS
5.0000 mg | ORAL_TABLET | Freq: Every day | ORAL | Status: DC
Start: 2013-06-01 — End: 2013-12-07

## 2013-06-01 MED ORDER — LISINOPRIL 40 MG PO TABS: ORAL_TABLET | ORAL | Status: DC

## 2013-06-01 MED ORDER — SIMVASTATIN 40 MG PO TABS: ORAL_TABLET | ORAL | Status: DC

## 2013-06-01 MED ORDER — AMLODIPINE BESYLATE 5 MG PO TABS: 5.0000 mg | ORAL_TABLET | Freq: Every day | ORAL | Status: DC

## 2013-06-01 MED ORDER — METFORMIN HCL 1000 MG PO TABS: 1000.0000 mg | ORAL_TABLET | Freq: Every day | ORAL | Status: DC

## 2013-06-01 NOTE — Patient Instructions (Signed)
      Dr Salisha Bardsley's Recommendations  For nutrition information, I recommend books:  1).Eat to Live by Dr Joel Fuhrman. 2).Prevent and Reverse Heart Disease by Dr Caldwell Esselstyn. 3) Dr Neal Barnard's Book:  Program to Reverse Diabetes  Exercise recommendations are:  If unable to walk, then the patient can exercise in a chair 3 times a day. By flapping arms like a bird gently and raising legs outwards to the front.  If ambulatory, the patient can go for walks for 30 minutes 3 times a week. Then increase the intensity and duration as tolerated.  Goal is to try to attain exercise frequency to 5 times a week.  If applicable: Best to perform resistance exercises (machines or weights) 2 days a week and cardio type exercises 3 days per week.  

## 2013-06-01 NOTE — Progress Notes (Signed)
Patient ID: Jared Norman, male   DOB: November 27, 1940, 73 y.o.   MRN: [id number] SUBJECTIVE: CC: Chief Complaint  Patient presents with  . Follow-up    3 month follow up chronic problems refill lisinopril 90 day     HPI: Patient is here for follow up of Diabetes Mellitus/HLD/HTN/Vit D Deficiency/: Symptoms evaluated: Denies Nocturia ,Denies Urinary Frequency , denies Blurred vision ,deniesDizziness,denies.Dysuria,denies paresthesias, denies extremity pain or ulcers.Marland Kitchendenies chest pain. has had an annual eye exam. do check the feet. Does check CBGs. Average CBG:110s Denies episodes of hypoglycemia. Does have an emergency hypoglycemic plan. admits toCompliance with medications. Denies Problems with medications.  Had a bout of the flu. Better now.  BPs has run systolic 89 to 355; DBP 97C  Exercise: restarted  Past Medical History  Diagnosis Date  . BPH (benign prostatic hyperplasia)   . Vitamin D deficiency   . Diabetes mellitus without complication   . Hyperlipidemia   . Hypertension    Past Surgical History  Procedure Laterality Date  . Right foot surgery Right 2008    tendon rupture   History   Social History  . Marital Status: Married    Spouse Name: N/A    Number of Children: N/A  . Years of Education: N/A   Occupational History  . Not on file.   Social History Main Topics  . Smoking status: Never Smoker   . Smokeless tobacco: Not on file  . Alcohol Use: No  . Drug Use: No  . Sexual Activity: Not on file   Other Topics Concern  . Not on file   Social History Narrative  . No narrative on file   No family history on file. Current Outpatient Prescriptions on File Prior to Visit  Medication Sig Dispense Refill  . aspirin 81 MG tablet Take 81 mg by mouth daily. One tablet three times a week.      . BESIVANCE 0.6 % SUSP       . Cholecalciferol (VITAMIN D-3) 5000 UNITS TABS Take 1 tablet by mouth daily.      . COMBIGAN 0.2-0.5 % ophthalmic solution       .  magnesium gluconate (MAGONATE) 500 MG tablet Take 500 mg by mouth daily.       No current facility-administered medications on file prior to visit.   No Known Allergies Immunization History  Administered Date(s) Administered  . Influenza,inj,Quad PF,36+ Mos 02/26/2013  . Pneumococcal Polysaccharide-23 05/13/2010   Prior to Admission medications   Medication Sig Start Date End Date Taking? Authorizing Provider  amLODipine (NORVASC) 5 MG tablet Take 1 tablet (5 mg total) by mouth daily. 01/05/13   Vernie Shanks, MD  aspirin 81 MG tablet Take 81 mg by mouth daily. One tablet three times a week.    Historical Provider, MD  BESIVANCE 0.6 % SUSP  12/31/12   Historical Provider, MD  Cholecalciferol (VITAMIN D-3) 5000 UNITS TABS Take 1 tablet by mouth daily.    Historical Provider, MD  COMBIGAN 0.2-0.5 % ophthalmic solution  12/31/12   Historical Provider, MD  finasteride (PROSCAR) 5 MG tablet Take 1 tablet (5 mg total) by mouth daily. 05/17/13   Chipper Herb, MD  lisinopril (PRINIVIL,ZESTRIL) 40 MG tablet Take 1 pill day 05/14/13   Elayne Snare, MD  magnesium gluconate (MAGONATE) 500 MG tablet Take 500 mg by mouth daily.    Historical Provider, MD  metFORMIN (GLUCOPHAGE) 1000 MG tablet Take 1 tablet (1,000 mg total) by mouth daily with breakfast.  05/28/13   Vernie Shanks, MD  simvastatin (ZOCOR) 40 MG tablet TAKE ONE-HALF TABLET BY MOUTH AT BEDTIME 04/23/13   Vernie Shanks, MD     ROS: As above in the HPI. All other systems are stable or negative.  OBJECTIVE: APPEARANCE:  Patient in no acute distress.The patient appeared well nourished and normally developed. Acyanotic. Waist: VITAL SIGNS:BP 130/83  Pulse 74  Temp(Src) 97.7 F (36.5 C) (Oral)  Ht '5\' 6"'  (1.676 m)  Wt 160 lb 9.6 oz (72.848 kg)  BMI 25.93 kg/m2  WM   SKIN: warm and  Dry without overt rashes, tattoos and scars  HEAD and Neck: without JVD, Head and scalp: normal Eyes:No scleral icterus. Fundi normal, eye movements  normal. Ears: Auricle normal, canal normal, Tympanic membranes normal, insufflation normal. Nose: normal Throat: normal Neck & thyroid: normal  CHEST & LUNGS: Chest wall: normal Lungs: Clear  CVS: Reveals the PMI to be normally located. Regular rhythm, First and Second Heart sounds are normal,  absence of murmurs, rubs or gallops. Peripheral vasculature: Radial pulses: normal Dorsal pedis pulses: normal Posterior pulses: normal  ABDOMEN:  Appearance: normal Benign, no organomegaly, no masses, no Abdominal Aortic enlargement. No Guarding , no rebound. No Bruits. Bowel sounds: normal  RECTAL: N/A GU: N/A  EXTREMETIES: nonedematous.  MUSCULOSKELETAL:  Spine: normal Joints: intact  NEUROLOGIC: oriented to time,place and person; nonfocal. Strength is normal Sensory is normal Reflexes are normal Cranial Nerves are normal.  Results for orders placed in visit on 06/01/13  POCT GLYCOSYLATED HEMOGLOBIN (HGB A1C)      Result Value Range   Hemoglobin A1C 6.3    POCT UA - MICROALBUMIN      Result Value Range   Microalbumin Ur, POC 20    POCT UA - MICROSCOPIC ONLY      Result Value Range   WBC, Ur, HPF, POC 25-30     RBC, urine, microscopic 5-10     Bacteria, U Microscopic many     Mucus, UA neg     Epithelial cells, urine per micros occ     Crystals, Ur, HPF, POC neg     Casts, Ur, LPF, POC neg     Yeast, UA neg    POCT URINALYSIS DIPSTICK      Result Value Range   Color, UA yellow     Clarity, UA cloudy     Glucose, UA neg     Bilirubin, UA neg     Ketones, UA neg     Spec Grav, UA 1.010     Blood, UA trace     pH, UA 7.5     Protein, UA neg     Urobilinogen, UA negative     Nitrite, UA pos     Leukocytes, UA moderate (2+)       ASSESSMENT:  Hypertension - Plan: CMP14+EGFR, lisinopril (PRINIVIL,ZESTRIL) 40 MG tablet, amLODipine (NORVASC) 5 MG tablet  Hyperlipidemia - Plan: NMR, lipoprofile, CMP14+EGFR, simvastatin (ZOCOR) 40 MG tablet  Diabetes  mellitus without complication - Plan: POCT glycosylated hemoglobin (Hb A1C), POCT UA - Microalbumin, metFORMIN (GLUCOPHAGE) 1000 MG tablet, Microalbumin, urine  Vitamin D deficiency - Plan: Vit D  25 hydroxy (rtn osteoporosis monitoring)  BPH (benign prostatic hyperplasia) - Plan: finasteride (PROSCAR) 5 MG tablet  HTN (hypertension) - Plan: amLODipine (NORVASC) 5 MG tablet  Urinary tract infection, site not specified - Plan: POCT UA - Microscopic Only, POCT urinalysis dipstick, Urine culture  PLAN:      Dr  Paula Libra Recommendations  For nutrition information, I recommend books:  1).Eat to Live by Dr Excell Seltzer. 2).Prevent and Reverse Heart Disease by Dr Karl Luke. 3) Dr Janene Harvey Book:  Program to Reverse Diabetes  Exercise recommendations are:  If unable to walk, then the patient can exercise in a chair 3 times a day. By flapping arms like a bird gently and raising legs outwards to the front.  If ambulatory, the patient can go for walks for 30 minutes 3 times a week. Then increase the intensity and duration as tolerated.  Goal is to try to attain exercise frequency to 5 times a week.  If applicable: Best to perform resistance exercises (machines or weights) 2 days a week and cardio type exercises 3 days per week.  Orders Placed This Encounter  Procedures  . Urine culture  . NMR, lipoprofile  . CMP14+EGFR  . Vit D  25 hydroxy (rtn osteoporosis monitoring)  . Microalbumin, urine  . POCT glycosylated hemoglobin (Hb A1C)  . POCT UA - Microalbumin  . POCT UA - Microscopic Only  . POCT urinalysis dipstick   Meds ordered this encounter  Medications  . simvastatin (ZOCOR) 40 MG tablet    Sig: TAKE ONE-HALF TABLET BY MOUTH AT BEDTIME    Dispense:  90 tablet    Refill:  1  . metFORMIN (GLUCOPHAGE) 1000 MG tablet    Sig: Take 1 tablet (1,000 mg total) by mouth daily with breakfast.    Dispense:  90 tablet    Refill:  3  . lisinopril (PRINIVIL,ZESTRIL) 40  MG tablet    Sig: Take 1 pill day    Dispense:  90 tablet    Refill:  3  . finasteride (PROSCAR) 5 MG tablet    Sig: Take 1 tablet (5 mg total) by mouth daily.    Dispense:  90 tablet    Refill:  3  . amLODipine (NORVASC) 5 MG tablet    Sig: Take 1 tablet (5 mg total) by mouth daily.    Dispense:  90 tablet    Refill:  3   Medications Discontinued During This Encounter  Medication Reason  . simvastatin (ZOCOR) 40 MG tablet Reorder  . metFORMIN (GLUCOPHAGE) 1000 MG tablet Reorder  . lisinopril (PRINIVIL,ZESTRIL) 40 MG tablet Reorder  . finasteride (PROSCAR) 5 MG tablet Reorder  . amLODipine (NORVASC) 5 MG tablet Reorder   Return in about 3 months (around 08/30/2013) for Recheck medical problems.  Jared Norman, M.D.

## 2013-06-02 LAB — MICROALBUMIN, URINE: Microalbumin, Urine: 38.7 ug/mL — ABNORMAL HIGH (ref 0.0–17.0)

## 2013-06-03 ENCOUNTER — Other Ambulatory Visit: Payer: Self-pay | Admitting: Family Medicine

## 2013-06-03 DIAGNOSIS — N390 Urinary tract infection, site not specified: Secondary | ICD-10-CM

## 2013-06-03 LAB — CMP14+EGFR
ALT: 23 IU/L (ref 0–44)
AST: 19 IU/L (ref 0–40)
Albumin/Globulin Ratio: 2 (ref 1.1–2.5)
Albumin: 4.6 g/dL (ref 3.5–4.8)
Alkaline Phosphatase: 66 IU/L (ref 39–117)
BUN/Creatinine Ratio: 15 (ref 10–22)
BUN: 16 mg/dL (ref 8–27)
CO2: 26 mmol/L (ref 18–29)
Calcium: 9.6 mg/dL (ref 8.6–10.2)
Chloride: 102 mmol/L (ref 97–108)
Creatinine, Ser: 1.1 mg/dL (ref 0.76–1.27)
GFR calc Af Amer: 77 mL/min/{1.73_m2} (ref >59)
GFR calc non Af Amer: 66 mL/min/{1.73_m2} (ref >59)
Globulin, Total: 2.3 g/dL (ref 1.5–4.5)
Glucose: 111 mg/dL — ABNORMAL HIGH (ref 65–99)
Potassium: 5.3 mmol/L — ABNORMAL HIGH (ref 3.5–5.2)
Sodium: 142 mmol/L (ref 134–144)
Total Bilirubin: 1 mg/dL (ref 0.0–1.2)
Total Protein: 6.9 g/dL (ref 6.0–8.5)

## 2013-06-03 LAB — NMR, LIPOPROFILE
Cholesterol: 137 mg/dL (ref <200)
HDL Cholesterol by NMR: 45 mg/dL (ref >=40)
HDL Particle Number: 31.1 umol/L (ref >=30.5)
LDL Particle Number: 830 nmol/L (ref <1000)
LDL Size: 20.4 nm — ABNORMAL LOW (ref >20.5)
LDLC SERPL CALC-MCNC: 68 mg/dL (ref <100)
LP-IR Score: 63 — ABNORMAL HIGH (ref <=45)
Small LDL Particle Number: 416 nmol/L (ref <=527)
Triglycerides by NMR: 119 mg/dL (ref <150)

## 2013-06-03 LAB — URINE CULTURE

## 2013-06-03 LAB — VITAMIN D 25 HYDROXY (VIT D DEFICIENCY, FRACTURES): Vit D, 25-Hydroxy: 51.2 ng/mL (ref 30.0–100.0)

## 2013-06-03 MED ORDER — SULFAMETHOXAZOLE-TMP DS 800-160 MG PO TABS: 1.0000 | ORAL_TABLET | Freq: Two times a day (BID) | ORAL | Status: DC

## 2013-06-03 NOTE — Progress Notes (Signed)
Quick Note:  Call Patient Labs that are abnormal: UTI  UCx was positive for E Coli  The rest are at goal  Recommendations: Need to be on 2 weeks of Bactrim (ordered in EPIC) and OV in 3 weeks to recheck the urine. Avoid potassium containing foods. The potassium was borderline Elevated.   ______

## 2013-07-02 ENCOUNTER — Encounter: Payer: Self-pay | Admitting: Family Medicine

## 2013-07-02 ENCOUNTER — Ambulatory Visit (INDEPENDENT_AMBULATORY_CARE_PROVIDER_SITE_OTHER): Payer: Medicare HMO | Admitting: Family Medicine

## 2013-07-02 DIAGNOSIS — N390 Urinary tract infection, site not specified: Secondary | ICD-10-CM | POA: Insufficient documentation

## 2013-07-02 DIAGNOSIS — R300 Dysuria: Secondary | ICD-10-CM | POA: Insufficient documentation

## 2013-07-02 LAB — POCT URINALYSIS DIPSTICK
Bilirubin, UA: NEGATIVE
Glucose, UA: NEGATIVE
Ketones, UA: NEGATIVE
Nitrite, UA: POSITIVE
Spec Grav, UA: 1.02
Urobilinogen, UA: NEGATIVE
pH, UA: 6.5

## 2013-07-02 LAB — POCT UA - MICROSCOPIC ONLY
Casts, Ur, LPF, POC: NEGATIVE
Crystals, Ur, HPF, POC: NEGATIVE
Mucus, UA: NEGATIVE
Yeast, UA: NEGATIVE

## 2013-07-02 MED ORDER — SULFAMETHOXAZOLE-TMP DS 800-160 MG PO TABS: 1.0000 | ORAL_TABLET | Freq: Two times a day (BID) | ORAL | Status: DC

## 2013-07-02 NOTE — Progress Notes (Signed)
Patient ID: Jared Norman, male   DOB: 1941/01/22, 73 y.o.   MRN: [id number] Patient left urine sample and did not stay.  [name] was seen today for follow-up.  Diagnoses and associated orders for this visit:  Dysuria - POCT urinalysis dipstick - POCT UA - Microscopic Only - Urine culture  UTI (urinary tract infection) - sulfamethoxazole-trimethoprim (BACTRIM DS) 800-160 MG per tablet; Take 1 tablet by mouth 2 (two) times daily.    Return in about 2 weeks (around 07/16/2013) for Recheck urinary tract infection.

## 2013-07-02 NOTE — Progress Notes (Signed)
Pt notified of urine results and aware rx called in to his pharmacy.

## 2013-07-04 ENCOUNTER — Other Ambulatory Visit: Payer: Self-pay | Admitting: Family Medicine

## 2013-07-04 DIAGNOSIS — N390 Urinary tract infection, site not specified: Secondary | ICD-10-CM

## 2013-07-04 LAB — URINE CULTURE

## 2013-07-04 MED ORDER — CIPROFLOXACIN HCL 500 MG PO TABS: 500.0000 mg | ORAL_TABLET | Freq: Two times a day (BID) | ORAL | Status: DC

## 2013-07-04 NOTE — Progress Notes (Signed)
Quick Note:  Call Patient Labs that are abnormal: Urine culture positive for E Coli and it is resistant to Bactrim Recommendations: Switch to cipro 50 mg twice A day for 2 weeks then come in to recheck. Ordered in EPIC   ______

## 2013-07-09 ENCOUNTER — Encounter (INDEPENDENT_AMBULATORY_CARE_PROVIDER_SITE_OTHER): Payer: Medicare HMO | Admitting: Ophthalmology

## 2013-07-09 DIAGNOSIS — E11311 Type 2 diabetes mellitus with unspecified diabetic retinopathy with macular edema: Secondary | ICD-10-CM

## 2013-07-09 DIAGNOSIS — H35039 Hypertensive retinopathy, unspecified eye: Secondary | ICD-10-CM

## 2013-07-09 DIAGNOSIS — E1139 Type 2 diabetes mellitus with other diabetic ophthalmic complication: Secondary | ICD-10-CM

## 2013-07-09 DIAGNOSIS — H43819 Vitreous degeneration, unspecified eye: Secondary | ICD-10-CM

## 2013-07-09 DIAGNOSIS — E11359 Type 2 diabetes mellitus with proliferative diabetic retinopathy without macular edema: Secondary | ICD-10-CM

## 2013-07-09 DIAGNOSIS — E1165 Type 2 diabetes mellitus with hyperglycemia: Secondary | ICD-10-CM

## 2013-07-09 DIAGNOSIS — I10 Essential (primary) hypertension: Secondary | ICD-10-CM

## 2013-07-19 ENCOUNTER — Telehealth: Payer: Self-pay | Admitting: Family Medicine

## 2013-07-19 NOTE — Telephone Encounter (Signed)
Pt aware and will come by tomorrow for specimen

## 2013-07-20 ENCOUNTER — Other Ambulatory Visit: Payer: Medicare HMO

## 2013-07-20 DIAGNOSIS — N390 Urinary tract infection, site not specified: Secondary | ICD-10-CM

## 2013-07-20 NOTE — Progress Notes (Signed)
Pt came in for labs only 

## 2013-07-21 LAB — URINE CULTURE

## 2013-07-26 ENCOUNTER — Telehealth: Payer: Self-pay | Admitting: Family Medicine

## 2013-07-26 NOTE — Telephone Encounter (Signed)
Message copied by Waverly Ferrari on Mon Jul 26, 2013 11:06 AM ------      Message from: Vernie Shanks      Created: Fri Jul 23, 2013  9:59 AM       Urine culture repeat was okay.      No changes.             ------

## 2013-08-10 ENCOUNTER — Encounter (INDEPENDENT_AMBULATORY_CARE_PROVIDER_SITE_OTHER): Payer: Medicare HMO | Admitting: Ophthalmology

## 2013-08-10 DIAGNOSIS — H43819 Vitreous degeneration, unspecified eye: Secondary | ICD-10-CM

## 2013-08-10 DIAGNOSIS — E11311 Type 2 diabetes mellitus with unspecified diabetic retinopathy with macular edema: Secondary | ICD-10-CM

## 2013-08-10 DIAGNOSIS — H35039 Hypertensive retinopathy, unspecified eye: Secondary | ICD-10-CM

## 2013-08-10 DIAGNOSIS — I10 Essential (primary) hypertension: Secondary | ICD-10-CM

## 2013-08-10 DIAGNOSIS — E1165 Type 2 diabetes mellitus with hyperglycemia: Secondary | ICD-10-CM

## 2013-08-10 DIAGNOSIS — E11359 Type 2 diabetes mellitus with proliferative diabetic retinopathy without macular edema: Secondary | ICD-10-CM

## 2013-08-10 DIAGNOSIS — E1139 Type 2 diabetes mellitus with other diabetic ophthalmic complication: Secondary | ICD-10-CM

## 2013-08-31 ENCOUNTER — Telehealth: Payer: Self-pay | Admitting: Family Medicine

## 2013-09-02 NOTE — Telephone Encounter (Signed)
Per Glean Salen pt gets refills at [facility]

## 2013-09-07 ENCOUNTER — Encounter: Payer: Self-pay | Admitting: Family Medicine

## 2013-09-07 ENCOUNTER — Ambulatory Visit (INDEPENDENT_AMBULATORY_CARE_PROVIDER_SITE_OTHER): Payer: Medicare HMO | Admitting: Family Medicine

## 2013-09-07 VITALS — BP 140/80 | HR 71 | Temp 97.5°F | Ht 66.0 in | Wt 161.2 lb

## 2013-09-07 DIAGNOSIS — E559 Vitamin D deficiency, unspecified: Secondary | ICD-10-CM

## 2013-09-07 DIAGNOSIS — E119 Type 2 diabetes mellitus without complications: Secondary | ICD-10-CM

## 2013-09-07 DIAGNOSIS — N400 Enlarged prostate without lower urinary tract symptoms: Secondary | ICD-10-CM

## 2013-09-07 DIAGNOSIS — E785 Hyperlipidemia, unspecified: Secondary | ICD-10-CM

## 2013-09-07 DIAGNOSIS — I10 Essential (primary) hypertension: Secondary | ICD-10-CM

## 2013-09-07 LAB — POCT GLYCOSYLATED HEMOGLOBIN (HGB A1C): Hemoglobin A1C: 6

## 2013-09-07 NOTE — Patient Instructions (Addendum)
DASH Diet The DASH diet stands for "Dietary Approaches to Stop Hypertension." It is a healthy eating plan that has been shown to reduce high blood pressure (hypertension) in as little as 14 days, while also possibly providing other significant health benefits. These other health benefits include reducing the risk of breast cancer after menopause and reducing the risk of type 2 diabetes, heart disease, colon cancer, and stroke. Health benefits also include weight loss and slowing kidney failure in patients with chronic kidney disease.  DIET GUIDELINES  Limit salt (sodium). Your diet should contain less than 1500 mg of sodium daily.  Limit refined or processed carbohydrates. Your diet should include mostly whole grains. Desserts and added sugars should be used sparingly.  Include small amounts of heart-healthy fats. These types of fats include nuts, oils, and tub margarine. Limit saturated and trans fats. These fats have been shown to be harmful in the body. CHOOSING FOODS  The following food groups are based on a 2000 calorie diet. See your Registered Dietitian for individual calorie needs. Grains and Grain Products (6 to 8 servings daily)  Eat More Often: Whole-wheat bread, brown rice, whole-grain or wheat pasta, quinoa, popcorn without added fat or salt (air popped).  Eat Less Often: White bread, white pasta, white rice, cornbread. Vegetables (4 to 5 servings daily)  Eat More Often: Fresh, frozen, and canned vegetables. Vegetables may be raw, steamed, roasted, or grilled with a minimal amount of fat.  Eat Less Often/Avoid: Creamed or fried vegetables. Vegetables in a cheese sauce. Fruit (4 to 5 servings daily)  Eat More Often: All fresh, canned (in natural juice), or frozen fruits. Dried fruits without added sugar. One hundred percent fruit juice ( cup [237 mL] daily).  Eat Less Often: Dried fruits with added sugar. Canned fruit in light or heavy syrup. [employer], Fish, and Poultry (2  servings or less daily. One serving is 3 to 4 oz [85-114 g]).  Eat More Often: Ninety percent or leaner ground beef, tenderloin, sirloin. Round cuts of beef, chicken breast, [place] breast. All fish. Grill, bake, or broil your meat. Nothing should be fried.  Eat Less Often/Avoid: Fatty cuts of meat, [place], or chicken leg, thigh, or wing. Fried cuts of meat or fish. Dairy (2 to 3 servings)  Eat More Often: Low-fat or fat-free milk, low-fat plain or light yogurt, reduced-fat or part-skim cheese.  Eat Less Often/Avoid: Milk (whole, 2%).Whole milk yogurt. Full-fat cheeses. Nuts, Seeds, and Legumes (4 to 5 servings per week)  Eat More Often: All without added salt.  Eat Less Often/Avoid: Salted nuts and seeds, canned beans with added salt. Fats and Sweets (limited)  Eat More Often: Vegetable oils, tub margarines without trans fats, sugar-free gelatin. Mayonnaise and salad dressings.  Eat Less Often/Avoid: Coconut oils, palm oils, butter, stick margarine, cream, half and half, cookies, candy, pie. FOR MORE INFORMATION The Dash Diet Eating Plan: www.dashdiet.org Document Released: 04/18/2011 Document Revised: 07/22/2011 Document Reviewed: 04/18/2011 [facility] Patient Information 2014 [place], [place].   Diabetes and Foot Care Diabetes may cause you to have problems because of poor blood supply (circulation) to your feet and legs. This may cause the skin on your feet to become thinner, break easier, and heal more slowly. Your skin may become dry, and the skin may peel and crack. You may also have nerve damage in your legs and feet causing decreased feeling in them. You may not notice minor injuries to your feet that could lead to infections or more serious problems. Taking  care of your feet is one of the most important things you can do for yourself.  HOME CARE INSTRUCTIONS  Wear shoes at all times, even in the house. Do not go barefoot. Bare feet are easily injured.  Check your feet daily for  blisters, cuts, and redness. If you cannot see the bottom of your feet, use a mirror or ask someone for help.  Wash your feet with warm water (do not use hot water) and mild soap. Then pat your feet and the areas between your toes until they are completely dry. Do not soak your feet as this can dry your skin.  Apply a moisturizing lotion or petroleum jelly (that does not contain alcohol and is unscented) to the skin on your feet and to dry, brittle toenails. Do not apply lotion between your toes.  Trim your toenails straight across. Do not dig under them or around the cuticle. File the edges of your nails with an emery board or nail file.  Do not cut corns or calluses or try to remove them with medicine.  Wear clean socks or stockings every day. Make sure they are not too tight. Do not wear knee-high stockings since they may decrease blood flow to your legs.  Wear shoes that fit properly and have enough cushioning. To break in new shoes, wear them for just a few hours a day. This prevents you from injuring your feet. Always look in your shoes before you put them on to be sure there are no objects inside.  Do not cross your legs. This may decrease the blood flow to your feet.  If you find a minor scrape, cut, or break in the skin on your feet, keep it and the skin around it clean and dry. These areas may be cleansed with mild soap and water. Do not cleanse the area with peroxide, alcohol, or iodine.  When you remove an adhesive bandage, be sure not to damage the skin around it.  If you have a wound, look at it several times a day to make sure it is healing.  Do not use heating pads or hot water bottles. They may burn your skin. If you have lost feeling in your feet or legs, you may not know it is happening until it is too late.  Make sure your health care provider performs a complete foot exam at least annually or more often if you have foot problems. Report any cuts, sores, or bruises to your  health care provider immediately. SEEK MEDICAL CARE IF:   You have an injury that is not healing.  You have cuts or breaks in the skin.  You have an ingrown nail.  You notice redness on your legs or feet.  You feel burning or tingling in your legs or feet.  You have pain or cramps in your legs and feet.  Your legs or feet are numb.  Your feet always feel cold. SEEK IMMEDIATE MEDICAL CARE IF:   There is increasing redness, swelling, or pain in or around a wound.  There is a red line that goes up your leg.  Pus is coming from a wound.  You develop a fever or as directed by your health care provider.  You notice a bad smell coming from an ulcer or wound. Document Released: 04/26/2000 Document Revised: 12/30/2012 Document Reviewed: 10/06/2012 [facility] Patient Information 2014 [place].        Dr Paula Libra Recommendations  For nutrition information, I recommend books:  1).Eat to Live by Dr Excell Seltzer. 2).Prevent and Reverse Heart Disease by Dr Karl Luke. 3) Dr Janene Harvey Book:  Program to Reverse Diabetes  Exercise recommendations are:  If unable to walk, then the patient can exercise in a chair 3 times a day. By flapping arms like a bird gently and raising legs outwards to the front.  If ambulatory, the patient can go for walks for 30 minutes 3 times a week. Then increase the intensity and duration as tolerated.  Goal is to try to attain exercise frequency to 5 times a week.  If applicable: Best to perform resistance exercises (machines or weights) 2 days a week and cardio type exercises 3 days per week.   Colonoscopy A colonoscopy is an exam to look at your colon. This exam helps to find lumps (tumors), growths (polyps), puffiness (swelling), and bleeding in your colon.  BEFORE THE PROCEDURE  You may need to drink clear liquids for 2 days before the exam.  Ask your doctor about changing or stopping your regular medicines.  You may  need liquid or medicines put in your butt (enema or laxatives) to help you poop.  You may need to drink a liquid over a short amount of time. This liquid cleans your colon.  Ask your doctor what time you need to arrive. PROCEDURE A tube is put in the opening of your butt (anus) and into your colon. The doctor will look for anything that is not normal. Your doctor may take a tissue sample (biopsy) from your colon to be looked at more closely. AFTER THE PROCEDURE  Have someone drive you home if you took pain medicine or a medicine to relax you (sedative).  You may see blood in your poop (stool). This is normal.  You may pass gas and have belly (abdominal) cramps. This is normal. Finding out the results of your test Ask when your test results will be ready. Make sure you get your test results. Document Released: 06/01/2010 Document Revised: 08/24/2012 Document Reviewed: 01/04/2013 [facility] Patient Information 2014 [place].

## 2013-09-07 NOTE — Progress Notes (Signed)
Patient ID: Jared Norman, male   DOB: Mar 23, 1941, 73 y.o.   MRN: [id number] SUBJECTIVE: CC: Chief Complaint  Patient presents with  . Follow-up    3 month follow up chronic problems . would like handicap sticker completed    HPI:  Patient is here for follow up of Diabetes Mellitus/HTN/HLD/VitD Def: Symptoms evaluated: Denies Nocturia ,Denies Urinary Frequency , denies Blurred vision ,deniesDizziness,denies.Dysuria,denies paresthesias, denies extremity pain or ulcers.Marland Kitchendenies chest pain. has had an annual eye exam. do check the feet. Does check CBGs. Average CBG:110 this am Denies episodes of hypoglycemia. Does have an emergency hypoglycemic plan. admits toCompliance with medications. Denies Problems with medications.  BP at home is 110/66  No complaints.  Not actively made significant changes to add enough vegetables in the diet.nor moved to a plant based  Diet.  Past Medical History  Diagnosis Date  . BPH (benign prostatic hyperplasia)   . Vitamin D deficiency   . Diabetes mellitus without complication   . Hyperlipidemia   . Hypertension    Past Surgical History  Procedure Laterality Date  . Right foot surgery Right 2008    tendon rupture   History   Social History  . Marital Status: Married    Spouse Name: N/A    Number of Children: N/A  . Years of Education: N/A   Occupational History  . Not on file.   Social History Main Topics  . Smoking status: Never Smoker   . Smokeless tobacco: Not on file  . Alcohol Use: No  . Drug Use: No  . Sexual Activity: Not on file   Other Topics Concern  . Not on file   Social History Narrative  . No narrative on file   No family history on file. Current Outpatient Prescriptions on File Prior to Visit  Medication Sig Dispense Refill  . amLODipine (NORVASC) 5 MG tablet Take 1 tablet (5 mg total) by mouth daily.  90 tablet  3  . aspirin 81 MG tablet Take 81 mg by mouth daily. One tablet three times a week.      .  BESIVANCE 0.6 % SUSP       . Cholecalciferol (VITAMIN D-3) 5000 UNITS TABS Take 1 tablet by mouth daily.      . COMBIGAN 0.2-0.5 % ophthalmic solution       . finasteride (PROSCAR) 5 MG tablet Take 1 tablet (5 mg total) by mouth daily.  90 tablet  3  . lisinopril (PRINIVIL,ZESTRIL) 40 MG tablet Take 1 pill day  90 tablet  3  . magnesium gluconate (MAGONATE) 500 MG tablet Take 500 mg by mouth daily.      . metFORMIN (GLUCOPHAGE) 1000 MG tablet Take 1 tablet (1,000 mg total) by mouth daily with breakfast.  90 tablet  3  . simvastatin (ZOCOR) 40 MG tablet TAKE ONE-HALF TABLET BY MOUTH AT BEDTIME  90 tablet  1   No current facility-administered medications on file prior to visit.   No Known Allergies Immunization History  Administered Date(s) Administered  . Influenza,inj,Quad PF,36+ Mos 02/26/2013  . Pneumococcal Polysaccharide-23 05/13/2010   Prior to Admission medications   Medication Sig Start Date End Date Taking? Authorizing Provider  amLODipine (NORVASC) 5 MG tablet Take 1 tablet (5 mg total) by mouth daily. 06/01/13   Vernie Shanks, MD  aspirin 81 MG tablet Take 81 mg by mouth daily. One tablet three times a week.    Historical Provider, MD  BESIVANCE 0.6 % SUSP  12/31/12  Historical Provider, MD  Cholecalciferol (VITAMIN D-3) 5000 UNITS TABS Take 1 tablet by mouth daily.    Historical Provider, MD  COMBIGAN 0.2-0.5 % ophthalmic solution  12/31/12   Historical Provider, MD  finasteride (PROSCAR) 5 MG tablet Take 1 tablet (5 mg total) by mouth daily. 06/01/13   Vernie Shanks, MD  lisinopril (PRINIVIL,ZESTRIL) 40 MG tablet Take 1 pill day 06/01/13   Vernie Shanks, MD  magnesium gluconate (MAGONATE) 500 MG tablet Take 500 mg by mouth daily.    Historical Provider, MD  metFORMIN (GLUCOPHAGE) 1000 MG tablet Take 1 tablet (1,000 mg total) by mouth daily with breakfast. 06/01/13   Vernie Shanks, MD  simvastatin (ZOCOR) 40 MG tablet TAKE ONE-HALF TABLET BY MOUTH AT BEDTIME 06/01/13   Vernie Shanks, MD     ROS: As above in the HPI. All other systems are stable or negative.  OBJECTIVE: APPEARANCE:  Patient in no acute distress.The patient appeared well nourished and normally developed. Acyanotic. Waist: VITAL SIGNS:BP 140/80  Pulse 71  Temp(Src) 97.5 F (36.4 C) (Oral)  Ht _0  (1.676 m)  Wt 161 lb 3.2 oz (73.12 kg)  BMI 26.03 kg/m2  WM  SKIN: warm and  Dry without overt rashes, tattoos and scars  HEAD and Neck: without JVD, Head and scalp: normal Eyes:No scleral icterus. Fundi normal, eye movements normal. Ears: Auricle normal, canal normal, Tympanic membranes normal, insufflation normal. Nose: normal Throat: normal Neck & thyroid: normal  CHEST & LUNGS: Chest wall: normal Lungs: Clear  CVS: Reveals the PMI to be normally located. Regular rhythm, First and Second Heart sounds are normal,  absence of murmurs, rubs or gallops. Peripheral vasculature: Radial pulses: normal Dorsal pedis pulses: normal Posterior pulses: normal  ABDOMEN:  Appearance: normal Benign, no organomegaly, no masses, no Abdominal Aortic enlargement. No Guarding , no rebound. No Bruits. Bowel sounds: normal  RECTAL: N/A GU: N/A  EXTREMETIES: nonedematous.  MUSCULOSKELETAL:  Spine: normal Joints: intact  NEUROLOGIC: oriented to time,place and person; nonfocal. Strength is normal Sensory is normal Reflexes are normal Cranial Nerves are normal.  ASSESSMENT:  Hypertension - Plan: CMP14+EGFR  Hyperlipidemia - Plan: Lipid panel  Diabetes mellitus without complication - Plan: POCT glycosylated hemoglobin (Hb A1C)  Vitamin D deficiency - Plan: Vit D  25 hydroxy (rtn osteoporosis monitoring)  BPH (benign prostatic hyperplasia)  PLAN: Dash diet  DM foot care      Dr Paula Libra Recommendations  For nutrition information, I recommend books:  1).Eat to Live by Dr Excell Seltzer. 2).Prevent and Reverse Heart Disease by Dr Karl Luke. 3) Dr Janene Harvey Book:  Program to Reverse Diabetes  Exercise recommendations are:  If unable to walk, then the patient can exercise in a chair 3 times a day. By flapping arms like a bird gently and raising legs outwards to the front.  If ambulatory, the patient can go for walks for 30 minutes 3 times a week. Then increase the intensity and duration as tolerated.  Goal is to try to attain exercise frequency to 5 times a week.  If applicable: Best to perform resistance exercises (machines or weights) 2 days a week and cardio type exercises 3 days per week  Recommend Colonoscopy, again patient declines.  Orders Placed This Encounter  Procedures  . CMP14+EGFR  . Lipid panel  . Vit D  25 hydroxy (rtn osteoporosis monitoring)  . POCT glycosylated hemoglobin (Hb A1C)   No orders of the defined types were placed in  this encounter.   Medications Discontinued During This Encounter  Medication Reason  . sulfamethoxazole-trimethoprim (BACTRIM DS) 800-160 MG per tablet Completed Course  . ciprofloxacin (CIPRO) 500 MG tablet Completed Course   Return in about 3 months (around 12/07/2013) for Recheck medical problems.  Ranald Alessio P. Jacelyn Grip, M.D.

## 2013-09-08 LAB — VITAMIN D 25 HYDROXY (VIT D DEFICIENCY, FRACTURES): Vit D, 25-Hydroxy: 48.9 ng/mL (ref 30.0–100.0)

## 2013-09-08 LAB — CMP14+EGFR
ALT: 16 IU/L (ref 0–44)
AST: 22 IU/L (ref 0–40)
Albumin/Globulin Ratio: 1.8 (ref 1.1–2.5)
Albumin: 4.2 g/dL (ref 3.5–4.8)
Alkaline Phosphatase: 80 IU/L (ref 39–117)
BUN/Creatinine Ratio: 16 (ref 10–22)
BUN: 18 mg/dL (ref 8–27)
CO2: 24 mmol/L (ref 18–29)
Calcium: 9.3 mg/dL (ref 8.6–10.2)
Chloride: 100 mmol/L (ref 97–108)
Creatinine, Ser: 1.11 mg/dL (ref 0.76–1.27)
GFR calc Af Amer: 76 mL/min/{1.73_m2} (ref >59)
GFR calc non Af Amer: 66 mL/min/{1.73_m2} (ref >59)
Globulin, Total: 2.3 g/dL (ref 1.5–4.5)
Glucose: 95 mg/dL (ref 65–99)
Potassium: 5 mmol/L (ref 3.5–5.2)
Sodium: 139 mmol/L (ref 134–144)
Total Bilirubin: 0.7 mg/dL (ref 0.0–1.2)
Total Protein: 6.5 g/dL (ref 6.0–8.5)

## 2013-09-08 LAB — LIPID PANEL
Chol/HDL Ratio: 2.5 ratio units (ref 0.0–5.0)
Cholesterol, Total: 125 mg/dL (ref 100–199)
HDL: 51 mg/dL (ref >39)
LDL Calculated: 61 mg/dL (ref 0–99)
Triglycerides: 65 mg/dL (ref 0–149)
VLDL Cholesterol Cal: 13 mg/dL (ref 5–40)

## 2013-09-14 ENCOUNTER — Encounter (INDEPENDENT_AMBULATORY_CARE_PROVIDER_SITE_OTHER): Payer: Medicare HMO | Admitting: Ophthalmology

## 2013-09-14 DIAGNOSIS — E1165 Type 2 diabetes mellitus with hyperglycemia: Secondary | ICD-10-CM

## 2013-09-14 DIAGNOSIS — I10 Essential (primary) hypertension: Secondary | ICD-10-CM

## 2013-09-14 DIAGNOSIS — E11311 Type 2 diabetes mellitus with unspecified diabetic retinopathy with macular edema: Secondary | ICD-10-CM

## 2013-09-14 DIAGNOSIS — E1139 Type 2 diabetes mellitus with other diabetic ophthalmic complication: Secondary | ICD-10-CM

## 2013-09-14 DIAGNOSIS — E11359 Type 2 diabetes mellitus with proliferative diabetic retinopathy without macular edema: Secondary | ICD-10-CM

## 2013-09-14 DIAGNOSIS — H43819 Vitreous degeneration, unspecified eye: Secondary | ICD-10-CM

## 2013-09-14 DIAGNOSIS — H35039 Hypertensive retinopathy, unspecified eye: Secondary | ICD-10-CM

## 2013-10-19 ENCOUNTER — Encounter (INDEPENDENT_AMBULATORY_CARE_PROVIDER_SITE_OTHER): Payer: Medicare HMO | Admitting: Ophthalmology

## 2013-10-19 DIAGNOSIS — I10 Essential (primary) hypertension: Secondary | ICD-10-CM

## 2013-10-19 DIAGNOSIS — H35039 Hypertensive retinopathy, unspecified eye: Secondary | ICD-10-CM

## 2013-10-19 DIAGNOSIS — E1165 Type 2 diabetes mellitus with hyperglycemia: Secondary | ICD-10-CM

## 2013-10-19 DIAGNOSIS — E11311 Type 2 diabetes mellitus with unspecified diabetic retinopathy with macular edema: Secondary | ICD-10-CM

## 2013-10-19 DIAGNOSIS — E11359 Type 2 diabetes mellitus with proliferative diabetic retinopathy without macular edema: Secondary | ICD-10-CM

## 2013-10-19 DIAGNOSIS — E1139 Type 2 diabetes mellitus with other diabetic ophthalmic complication: Secondary | ICD-10-CM

## 2013-10-19 DIAGNOSIS — H43819 Vitreous degeneration, unspecified eye: Secondary | ICD-10-CM

## 2013-10-21 ENCOUNTER — Encounter: Payer: Self-pay | Admitting: *Deleted

## 2013-11-16 ENCOUNTER — Encounter (INDEPENDENT_AMBULATORY_CARE_PROVIDER_SITE_OTHER): Payer: Medicare HMO | Admitting: Ophthalmology

## 2013-11-16 DIAGNOSIS — E11311 Type 2 diabetes mellitus with unspecified diabetic retinopathy with macular edema: Secondary | ICD-10-CM

## 2013-11-16 DIAGNOSIS — E11359 Type 2 diabetes mellitus with proliferative diabetic retinopathy without macular edema: Secondary | ICD-10-CM

## 2013-11-16 DIAGNOSIS — E1139 Type 2 diabetes mellitus with other diabetic ophthalmic complication: Secondary | ICD-10-CM

## 2013-11-16 DIAGNOSIS — H35039 Hypertensive retinopathy, unspecified eye: Secondary | ICD-10-CM

## 2013-11-16 DIAGNOSIS — I10 Essential (primary) hypertension: Secondary | ICD-10-CM

## 2013-11-16 DIAGNOSIS — E1165 Type 2 diabetes mellitus with hyperglycemia: Secondary | ICD-10-CM

## 2013-11-16 DIAGNOSIS — H43819 Vitreous degeneration, unspecified eye: Secondary | ICD-10-CM

## 2013-12-07 ENCOUNTER — Encounter: Payer: Self-pay | Admitting: Family

## 2013-12-07 ENCOUNTER — Ambulatory Visit (INDEPENDENT_AMBULATORY_CARE_PROVIDER_SITE_OTHER): Payer: Medicare HMO | Admitting: Family

## 2013-12-07 VITALS — BP 140/75 | HR 65 | Temp 97.4°F | Ht 66.0 in | Wt 164.4 lb

## 2013-12-07 DIAGNOSIS — E785 Hyperlipidemia, unspecified: Secondary | ICD-10-CM

## 2013-12-07 DIAGNOSIS — E119 Type 2 diabetes mellitus without complications: Secondary | ICD-10-CM

## 2013-12-07 DIAGNOSIS — Z23 Encounter for immunization: Secondary | ICD-10-CM

## 2013-12-07 DIAGNOSIS — I10 Essential (primary) hypertension: Secondary | ICD-10-CM

## 2013-12-07 DIAGNOSIS — E559 Vitamin D deficiency, unspecified: Secondary | ICD-10-CM

## 2013-12-07 DIAGNOSIS — N400 Enlarged prostate without lower urinary tract symptoms: Secondary | ICD-10-CM

## 2013-12-07 MED ORDER — METFORMIN HCL 1000 MG PO TABS: 1000.0000 mg | ORAL_TABLET | Freq: Every day | ORAL | Status: DC

## 2013-12-07 MED ORDER — FINASTERIDE 5 MG PO TABS: 5.0000 mg | ORAL_TABLET | Freq: Every day | ORAL | Status: DC

## 2013-12-07 MED ORDER — LISINOPRIL 40 MG PO TABS: ORAL_TABLET | ORAL | Status: DC

## 2013-12-07 MED ORDER — AMLODIPINE BESYLATE 5 MG PO TABS: 5.0000 mg | ORAL_TABLET | Freq: Every day | ORAL | Status: DC

## 2013-12-07 MED ORDER — SIMVASTATIN 40 MG PO TABS: ORAL_TABLET | ORAL | Status: DC

## 2013-12-07 NOTE — Progress Notes (Signed)
Subjective:    Patient ID: Jared Norman, male    DOB: [birth year], 73 y.o.   MRN: [id number]  Diabetes He presents for his follow-up diabetic visit. He has type 2 diabetes mellitus. His disease course has been improving. Pertinent negatives for hypoglycemia include no confusion, dizziness or headaches. Pertinent negatives for diabetes include no blurred vision, no foot paresthesias and no foot ulcerations. Symptoms are improving. Pertinent negatives for diabetic complications include no CVA, heart disease, nephropathy or peripheral neuropathy. Risk factors for coronary artery disease include diabetes mellitus, dyslipidemia, hypertension, male sex and family history. Current diabetic treatment includes oral agent (monotherapy). He is compliant with treatment all of the time. He is following a generally healthy diet. His breakfast blood glucose range is generally 90-110 mg/dl. An ACE inhibitor/angiotensin II receptor blocker is being taken. Eye exam is current.  Hypertension This is a chronic problem. The current episode started more than 1 year ago. The problem has been waxing and waning since onset. The problem is uncontrolled. Pertinent negatives include no blurred vision, headaches, palpitations, peripheral edema or shortness of breath. Risk factors for coronary artery disease include diabetes mellitus, dyslipidemia, family history and male gender. Past treatments include ACE inhibitors and calcium channel blockers. The current treatment provides mild improvement. There is no history of kidney disease, CAD/MI, CVA or heart failure. There is no history of sleep apnea.  Hyperlipidemia This is a chronic problem. The current episode started more than 1 year ago. The problem is controlled. Recent lipid tests were reviewed and are normal. Exacerbating diseases include diabetes. He has no history of hypothyroidism. Associated symptoms include myalgias. Pertinent negatives include no leg pain or shortness of  breath. Current antihyperlipidemic treatment includes statins. The current treatment provides moderate improvement of lipids. Risk factors for coronary artery disease include diabetes mellitus, dyslipidemia, family history, hypertension and male sex.  Benign Prostatic Hypertrophy This is a chronic problem. The current episode started more than 1 year ago. The problem has been waxing and waning since onset. Irritative symptoms include nocturia. Irritative symptoms do not include frequency or urgency. Pertinent negatives include no hematuria, hesitancy, nausea or vomiting. Past treatments include finasteride. The treatment provided significant relief.      Review of Systems  Constitutional: Negative.   HENT: Negative.   Eyes: Negative for blurred vision.  Respiratory: Negative.  Negative for shortness of breath.   Cardiovascular: Negative.  Negative for palpitations.  Gastrointestinal: Negative.  Negative for nausea and vomiting.  Endocrine: Negative.   Genitourinary: Positive for nocturia. Negative for hesitancy, urgency, frequency and hematuria.  Musculoskeletal: Positive for myalgias.  Neurological: Negative.  Negative for dizziness and headaches.  Hematological: Negative.   Psychiatric/Behavioral: Negative.  Negative for confusion.  All other systems reviewed and are negative.      Objective:   Physical Exam  Vitals reviewed. Constitutional: He is oriented to person, place, and time. He appears well-developed and well-nourished. No distress.  HENT:  Head: Normocephalic.  Right Ear: External ear normal.  Left Ear: External ear normal.  Mouth/Throat: Oropharynx is clear and moist.  Eyes: Pupils are equal, round, and reactive to light. Right eye exhibits no discharge. Left eye exhibits no discharge.  Neck: Normal range of motion. Neck supple. No thyromegaly present.  Cardiovascular: Normal rate, regular rhythm, normal heart sounds and intact distal pulses.   No murmur  heard. Pulmonary/Chest: Effort normal and breath sounds normal. No respiratory distress. He has no wheezes.  Abdominal: Soft. Bowel sounds are normal. He exhibits  no distension. There is no tenderness.  Musculoskeletal: Normal range of motion. He exhibits no edema and no tenderness.  Neurological: He is alert and oriented to person, place, and time. He has normal reflexes. No cranial nerve deficit.  Skin: Skin is warm and dry. No rash noted. No erythema.  Psychiatric: He has a normal mood and affect. His behavior is normal. Judgment and thought content normal.      BP 140/75  Pulse 65  Temp(Src) 97.4 F (36.3 C) (Oral)  Ht _0  (1.676 m)  Wt 164 lb 6.4 oz (74.571 kg)  BMI 26.55 kg/m2     Assessment & Plan:  1. Essential hypertension -Pt states he has not taken his BP meds this morning and does not want his medication adjusted at this time - CMP14+EGFR - amLODipine (NORVASC) 5 MG tablet; Take 1 tablet (5 mg total) by mouth daily.  Dispense: 90 tablet; Refill: 3 - lisinopril (PRINIVIL,ZESTRIL) 40 MG tablet; Take 1 pill day  Dispense: 90 tablet; Refill: 3  2. Diabetes mellitus without complication - NIO27+OJJK - metFORMIN (GLUCOPHAGE) 1000 MG tablet; Take 1 tablet (1,000 mg total) by mouth daily with breakfast.  Dispense: 90 tablet; Refill: 3  3. BPH (benign prostatic hyperplasia) - finasteride (PROSCAR) 5 MG tablet; Take 1 tablet (5 mg total) by mouth daily.  Dispense: 90 tablet; Refill: 3  4. Hyperlipidemia - CMP14+EGFR - Lipid panel - simvastatin (ZOCOR) 40 MG tablet; TAKE ONE-HALF TABLET BY MOUTH AT BEDTIME  Dispense: 90 tablet; Refill: 1  5. Vitamin D deficiency - Vit D  25 hydroxy (rtn osteoporosis monitoring)   Continue all meds Labs pending Health Maintenance reviewed-TDAP given today Diet and exercise encouraged RTO 6 months  Evelina Dun, FNP

## 2013-12-07 NOTE — Patient Instructions (Signed)

## 2013-12-08 LAB — CMP14+EGFR
ALT: 23 IU/L (ref 0–44)
AST: 18 IU/L (ref 0–40)
Albumin/Globulin Ratio: 1.8 (ref 1.1–2.5)
Albumin: 4.2 g/dL (ref 3.5–4.8)
Alkaline Phosphatase: 76 IU/L (ref 39–117)
BUN/Creatinine Ratio: 15 (ref 10–22)
BUN: 18 mg/dL (ref 8–27)
CO2: 25 mmol/L (ref 18–29)
Calcium: 9.7 mg/dL (ref 8.6–10.2)
Chloride: 102 mmol/L (ref 97–108)
Creatinine, Ser: 1.18 mg/dL (ref 0.76–1.27)
GFR calc Af Amer: 70 mL/min/{1.73_m2} (ref >59)
GFR calc non Af Amer: 60 mL/min/{1.73_m2} (ref >59)
Globulin, Total: 2.3 g/dL (ref 1.5–4.5)
Glucose: 118 mg/dL — ABNORMAL HIGH (ref 65–99)
Potassium: 5.7 mmol/L — ABNORMAL HIGH (ref 3.5–5.2)
Sodium: 141 mmol/L (ref 134–144)
Total Bilirubin: 0.7 mg/dL (ref 0.0–1.2)
Total Protein: 6.5 g/dL (ref 6.0–8.5)

## 2013-12-08 LAB — LIPID PANEL
Chol/HDL Ratio: 2.7 ratio units (ref 0.0–5.0)
Cholesterol, Total: 128 mg/dL (ref 100–199)
HDL: 48 mg/dL (ref >39)
LDL Calculated: 65 mg/dL (ref 0–99)
Triglycerides: 76 mg/dL (ref 0–149)
VLDL Cholesterol Cal: 15 mg/dL (ref 5–40)

## 2013-12-08 LAB — VITAMIN D 25 HYDROXY (VIT D DEFICIENCY, FRACTURES): Vit D, 25-Hydroxy: 50 ng/mL (ref 30.0–100.0)

## 2013-12-09 ENCOUNTER — Ambulatory Visit: Payer: Medicare HMO | Admitting: Family

## 2013-12-10 ENCOUNTER — Other Ambulatory Visit: Payer: Self-pay | Admitting: Family

## 2013-12-10 DIAGNOSIS — E875 Hyperkalemia: Secondary | ICD-10-CM

## 2013-12-13 ENCOUNTER — Other Ambulatory Visit (INDEPENDENT_AMBULATORY_CARE_PROVIDER_SITE_OTHER): Payer: Medicare HMO

## 2013-12-13 DIAGNOSIS — E875 Hyperkalemia: Secondary | ICD-10-CM

## 2013-12-14 ENCOUNTER — Telehealth: Payer: Self-pay | Admitting: Family Medicine

## 2013-12-14 LAB — BMP8+EGFR
BUN/Creatinine Ratio: 15 (ref 10–22)
BUN: 18 mg/dL (ref 8–27)
CO2: 24 mmol/L (ref 18–29)
Calcium: 9.3 mg/dL (ref 8.6–10.2)
Chloride: 100 mmol/L (ref 97–108)
Creatinine, Ser: 1.23 mg/dL (ref 0.76–1.27)
GFR calc Af Amer: 66 mL/min/{1.73_m2} (ref >59)
GFR calc non Af Amer: 57 mL/min/{1.73_m2} — ABNORMAL LOW (ref >59)
Glucose: 97 mg/dL (ref 65–99)
Potassium: 5 mmol/L (ref 3.5–5.2)
Sodium: 142 mmol/L (ref 134–144)

## 2013-12-14 NOTE — Telephone Encounter (Signed)
Message copied by Waverly Ferrari on Tue Dec 14, 2013  4:27 PM ------      Message from: [place], [place] A      Created: Tue Dec 14, 2013  3:08 PM       Kidney and liver function stable       ------

## 2013-12-20 ENCOUNTER — Other Ambulatory Visit: Payer: Self-pay | Admitting: Family Medicine

## 2013-12-20 ENCOUNTER — Other Ambulatory Visit: Payer: Self-pay

## 2013-12-20 ENCOUNTER — Encounter: Payer: Self-pay | Admitting: Family Medicine

## 2013-12-20 ENCOUNTER — Ambulatory Visit (INDEPENDENT_AMBULATORY_CARE_PROVIDER_SITE_OTHER): Payer: Medicare HMO

## 2013-12-20 ENCOUNTER — Telehealth: Payer: Self-pay | Admitting: Family Medicine

## 2013-12-20 ENCOUNTER — Encounter (HOSPITAL_COMMUNITY): Payer: Medicare HMO | Admitting: Anesthesiology

## 2013-12-20 ENCOUNTER — Ambulatory Visit (HOSPITAL_COMMUNITY): Payer: Medicare HMO | Admitting: Anesthesiology

## 2013-12-20 ENCOUNTER — Ambulatory Visit (HOSPITAL_COMMUNITY)
Admission: RE | Admit: 2013-12-20 | Discharge: 2013-12-21 | Disposition: A | Discharge status: Home / Self Care | Payer: Medicare HMO | Source: Ambulatory Visit | Attending: Urology | Admitting: Urology

## 2013-12-20 ENCOUNTER — Encounter (HOSPITAL_COMMUNITY): Payer: Self-pay | Admitting: *Deleted

## 2013-12-20 ENCOUNTER — Ambulatory Visit (HOSPITAL_COMMUNITY)
Admission: RE | Admit: 2013-12-20 | Discharge: 2013-12-20 | Disposition: A | Discharge status: Home / Self Care | Payer: Medicare HMO | Source: Ambulatory Visit | Attending: Family Medicine | Admitting: Family Medicine

## 2013-12-20 ENCOUNTER — Encounter (HOSPITAL_COMMUNITY)
Admission: RE | Disposition: A | Discharge status: Home / Self Care | Payer: Self-pay | Source: Ambulatory Visit | Attending: Urology

## 2013-12-20 ENCOUNTER — Other Ambulatory Visit: Payer: Self-pay | Admitting: Family

## 2013-12-20 ENCOUNTER — Ambulatory Visit (INDEPENDENT_AMBULATORY_CARE_PROVIDER_SITE_OTHER): Payer: Medicare HMO | Admitting: Family Medicine

## 2013-12-20 ENCOUNTER — Other Ambulatory Visit: Payer: Self-pay | Admitting: Urology

## 2013-12-20 VITALS — BP 156/82 | HR 83 | Temp 98.7°F | Ht 60.0 in | Wt 163.6 lb

## 2013-12-20 DIAGNOSIS — E559 Vitamin D deficiency, unspecified: Secondary | ICD-10-CM | POA: Diagnosis not present

## 2013-12-20 DIAGNOSIS — Z87440 Personal history of urinary (tract) infections: Secondary | ICD-10-CM | POA: Diagnosis not present

## 2013-12-20 DIAGNOSIS — N401 Enlarged prostate with lower urinary tract symptoms: Secondary | ICD-10-CM | POA: Diagnosis not present

## 2013-12-20 DIAGNOSIS — Z79899 Other long term (current) drug therapy: Secondary | ICD-10-CM | POA: Insufficient documentation

## 2013-12-20 DIAGNOSIS — R319 Hematuria, unspecified: Secondary | ICD-10-CM

## 2013-12-20 DIAGNOSIS — Z7982 Long term (current) use of aspirin: Secondary | ICD-10-CM | POA: Insufficient documentation

## 2013-12-20 DIAGNOSIS — N200 Calculus of kidney: Secondary | ICD-10-CM

## 2013-12-20 DIAGNOSIS — E785 Hyperlipidemia, unspecified: Secondary | ICD-10-CM | POA: Insufficient documentation

## 2013-12-20 DIAGNOSIS — R82998 Other abnormal findings in urine: Secondary | ICD-10-CM | POA: Diagnosis not present

## 2013-12-20 DIAGNOSIS — E7800 Pure hypercholesterolemia, unspecified: Secondary | ICD-10-CM | POA: Insufficient documentation

## 2013-12-20 DIAGNOSIS — K219 Gastro-esophageal reflux disease without esophagitis: Secondary | ICD-10-CM | POA: Insufficient documentation

## 2013-12-20 DIAGNOSIS — I10 Essential (primary) hypertension: Secondary | ICD-10-CM | POA: Diagnosis not present

## 2013-12-20 DIAGNOSIS — N289 Disorder of kidney and ureter, unspecified: Secondary | ICD-10-CM | POA: Diagnosis not present

## 2013-12-20 DIAGNOSIS — E119 Type 2 diabetes mellitus without complications: Secondary | ICD-10-CM | POA: Diagnosis not present

## 2013-12-20 HISTORY — PX: CYSTOSCOPY WITH RETROGRADE PYELOGRAM, URETEROSCOPY AND STENT PLACEMENT: SHX5789

## 2013-12-20 LAB — GLUCOSE, CAPILLARY
Glucose-Capillary: 134 mg/dL — ABNORMAL HIGH (ref 70–99)
Glucose-Capillary: 139 mg/dL — ABNORMAL HIGH (ref 70–99)

## 2013-12-20 LAB — POCT URINALYSIS DIPSTICK
Bilirubin, UA: NEGATIVE
Glucose, UA: 250
Ketones, UA: NEGATIVE
Nitrite, UA: NEGATIVE
Spec Grav, UA: 1.01
Urobilinogen, UA: NEGATIVE
pH, UA: 7.5

## 2013-12-20 LAB — POCT UA - MICROSCOPIC ONLY
Bacteria, U Microscopic: NEGATIVE
Casts, Ur, LPF, POC: NEGATIVE
Crystals, Ur, HPF, POC: NEGATIVE
Yeast, UA: NEGATIVE

## 2013-12-20 SURGERY — CYSTOURETEROSCOPY, WITH RETROGRADE PYELOGRAM AND STENT INSERTION
Anesthesia: General | Site: Ureter | Laterality: Left

## 2013-12-20 MED ORDER — OXYCODONE-ACETAMINOPHEN 5-325 MG PO TABS: 1.0000 | ORAL_TABLET | ORAL | Status: DC | PRN

## 2013-12-20 MED ORDER — BISACODYL 10 MG RE SUPP: 10.0000 mg | Freq: Every day | RECTAL | Status: DC | PRN

## 2013-12-20 MED ORDER — LIDOCAINE HCL (CARDIAC) 20 MG/ML IV SOLN
INTRAVENOUS | Status: DC | PRN
  Administered 2013-12-20: 50 mg via INTRAVENOUS

## 2013-12-20 MED ORDER — LIDOCAINE HCL 2 % EX GEL
CUTANEOUS | Status: AC
  Filled 2013-12-20: qty 10

## 2013-12-20 MED ORDER — SIMVASTATIN 20 MG PO TABS
20.0000 mg | ORAL_TABLET | Freq: Every day | ORAL | Status: DC
  Administered 2013-12-20: 20 mg via ORAL
  Filled 2013-12-20 (×2): qty 1

## 2013-12-20 MED ORDER — CIPROFLOXACIN IN D5W 400 MG/200ML IV SOLN
INTRAVENOUS | Status: AC
  Filled 2013-12-20: qty 200

## 2013-12-20 MED ORDER — POTASSIUM CHLORIDE IN NACL 20-0.45 MEQ/L-% IV SOLN
INTRAVENOUS | Status: DC
  Filled 2013-12-20 (×5): qty 1000

## 2013-12-20 MED ORDER — OXYCODONE-ACETAMINOPHEN 5-325 MG PO TABS: 1.0000 | ORAL_TABLET | Freq: Three times a day (TID) | ORAL | Status: DC | PRN

## 2013-12-20 MED ORDER — METOCLOPRAMIDE HCL 5 MG/ML IJ SOLN
INTRAMUSCULAR | Status: DC | PRN
  Administered 2013-12-20: 5 mg via INTRAVENOUS

## 2013-12-20 MED ORDER — ONDANSETRON 8 MG PO TBDP: 8.0000 mg | ORAL_TABLET | Freq: Three times a day (TID) | ORAL | Status: DC | PRN

## 2013-12-20 MED ORDER — AMLODIPINE BESYLATE 5 MG PO TABS
5.0000 mg | ORAL_TABLET | Freq: Every day | ORAL | Status: DC
  Filled 2013-12-20: qty 1

## 2013-12-20 MED ORDER — KETOROLAC TROMETHAMINE 60 MG/2ML IM SOLN
60.0000 mg | Freq: Once | INTRAMUSCULAR | Status: AC
Start: 2013-12-20 — End: 2013-12-20
  Administered 2013-12-20: 60 mg via INTRAMUSCULAR

## 2013-12-20 MED ORDER — FENTANYL CITRATE 0.05 MG/ML IJ SOLN
INTRAMUSCULAR | Status: AC
  Filled 2013-12-20: qty 5

## 2013-12-20 MED ORDER — ONDANSETRON HCL 4 MG/2ML IJ SOLN: 4.0000 mg | INTRAMUSCULAR | Status: DC | PRN

## 2013-12-20 MED ORDER — METFORMIN HCL 500 MG PO TABS
1000.0000 mg | ORAL_TABLET | Freq: Every day | ORAL | Status: DC
  Administered 2013-12-21: 1000 mg via ORAL
  Filled 2013-12-20 (×2): qty 2

## 2013-12-20 MED ORDER — PROMETHAZINE HCL 25 MG PO TABS: 25.0000 mg | ORAL_TABLET | Freq: Three times a day (TID) | ORAL | Status: DC | PRN

## 2013-12-20 MED ORDER — CIPROFLOXACIN IN D5W 400 MG/200ML IV SOLN
400.0000 mg | INTRAVENOUS | Status: AC
  Administered 2013-12-20: 400 mg via INTRAVENOUS

## 2013-12-20 MED ORDER — PROPOFOL 10 MG/ML IV BOLUS
INTRAVENOUS | Status: DC | PRN
  Administered 2013-12-20: 150 mg via INTRAVENOUS

## 2013-12-20 MED ORDER — HYDROMORPHONE HCL PF 1 MG/ML IJ SOLN: 0.2500 mg | INTRAMUSCULAR | Status: DC | PRN

## 2013-12-20 MED ORDER — ACETAMINOPHEN 325 MG PO TABS: 650.0000 mg | ORAL_TABLET | ORAL | Status: DC | PRN

## 2013-12-20 MED ORDER — INSULIN ASPART 100 UNIT/ML ~~LOC~~ SOLN: 0.0000 [IU] | Freq: Every day | SUBCUTANEOUS | Status: DC

## 2013-12-20 MED ORDER — LACTATED RINGERS IV SOLN
INTRAVENOUS | Status: DC | PRN
  Administered 2013-12-20: 20:00:00 via INTRAVENOUS

## 2013-12-20 MED ORDER — MEPERIDINE HCL 50 MG/ML IJ SOLN: 6.2500 mg | INTRAMUSCULAR | Status: DC | PRN

## 2013-12-20 MED ORDER — MIDAZOLAM HCL 2 MG/2ML IJ SOLN
INTRAMUSCULAR | Status: AC
  Filled 2013-12-20: qty 2

## 2013-12-20 MED ORDER — LISINOPRIL 20 MG PO TABS
40.0000 mg | ORAL_TABLET | Freq: Every day | ORAL | Status: DC
  Filled 2013-12-20: qty 2

## 2013-12-20 MED ORDER — DOCUSATE SODIUM 100 MG PO CAPS
100.0000 mg | ORAL_CAPSULE | Freq: Two times a day (BID) | ORAL | Status: DC
  Administered 2013-12-20: 100 mg via ORAL
  Filled 2013-12-20 (×3): qty 1

## 2013-12-20 MED ORDER — DEXAMETHASONE SODIUM PHOSPHATE 10 MG/ML IJ SOLN
INTRAMUSCULAR | Status: DC | PRN
  Administered 2013-12-20: 10 mg via INTRAVENOUS

## 2013-12-20 MED ORDER — MIDAZOLAM HCL 5 MG/5ML IJ SOLN
INTRAMUSCULAR | Status: DC | PRN
  Administered 2013-12-20 (×2): 1 mg via INTRAVENOUS

## 2013-12-20 MED ORDER — PROPOFOL 10 MG/ML IV BOLUS
INTRAVENOUS | Status: AC
  Filled 2013-12-20: qty 20

## 2013-12-20 MED ORDER — ACETAMINOPHEN 10 MG/ML IV SOLN
1000.0000 mg | Freq: Once | INTRAVENOUS | Status: AC
  Administered 2013-12-20: 1000 mg via INTRAVENOUS
  Filled 2013-12-20: qty 100

## 2013-12-20 MED ORDER — HYDROMORPHONE HCL PF 1 MG/ML IJ SOLN: 0.5000 mg | INTRAMUSCULAR | Status: DC | PRN

## 2013-12-20 MED ORDER — HYOSCYAMINE SULFATE 0.125 MG SL SUBL
0.1250 mg | SUBLINGUAL_TABLET | SUBLINGUAL | Status: DC | PRN
Start: 2013-12-20 — End: 2013-12-21
  Filled 2013-12-20: qty 1

## 2013-12-20 MED ORDER — OXYCODONE HCL 5 MG PO TABS: 5.0000 mg | ORAL_TABLET | Freq: Once | ORAL | Status: DC | PRN

## 2013-12-20 MED ORDER — OXYCODONE HCL 5 MG/5ML PO SOLN
5.0000 mg | Freq: Once | ORAL | Status: DC | PRN
  Filled 2013-12-20: qty 5

## 2013-12-20 MED ORDER — 0.9 % SODIUM CHLORIDE (POUR BTL) OPTIME
TOPICAL | Status: DC | PRN
  Administered 2013-12-20: 1000 mL

## 2013-12-20 MED ORDER — MAGNESIUM GLUCONATE 500 MG PO TABS
500.0000 mg | ORAL_TABLET | Freq: Every day | ORAL | Status: DC
  Filled 2013-12-20: qty 1

## 2013-12-20 MED ORDER — CIPROFLOXACIN HCL 250 MG PO TABS
250.0000 mg | ORAL_TABLET | Freq: Two times a day (BID) | ORAL | Status: DC
Start: 2013-12-20 — End: 2013-12-21
  Administered 2013-12-20 – 2013-12-21 (×2): 250 mg via ORAL
  Filled 2013-12-20 (×4): qty 1

## 2013-12-20 MED ORDER — ZOLPIDEM TARTRATE 5 MG PO TABS: 5.0000 mg | ORAL_TABLET | Freq: Every evening | ORAL | Status: DC | PRN

## 2013-12-20 MED ORDER — ONDANSETRON HCL 4 MG/2ML IJ SOLN
INTRAMUSCULAR | Status: DC | PRN
  Administered 2013-12-20: 4 mg via INTRAVENOUS

## 2013-12-20 MED ORDER — IOHEXOL 300 MG/ML  SOLN
INTRAMUSCULAR | Status: DC | PRN
  Administered 2013-12-20: 7 mL

## 2013-12-20 MED ORDER — INSULIN ASPART 100 UNIT/ML ~~LOC~~ SOLN
0.0000 [IU] | Freq: Three times a day (TID) | SUBCUTANEOUS | Status: DC
  Administered 2013-12-21: 5 [IU] via SUBCUTANEOUS

## 2013-12-20 MED ORDER — SODIUM CHLORIDE 0.9 % IR SOLN
Status: DC | PRN
  Administered 2013-12-20: 3000 mL

## 2013-12-20 MED ORDER — BELLADONNA ALKALOIDS-OPIUM 16.2-60 MG RE SUPP
RECTAL | Status: AC
  Filled 2013-12-20: qty 1

## 2013-12-20 MED ORDER — PROMETHAZINE HCL 25 MG/ML IJ SOLN: 6.2500 mg | INTRAMUSCULAR | Status: DC | PRN

## 2013-12-20 MED ORDER — HYDROCODONE-ACETAMINOPHEN 5-325 MG PO TABS: 1.0000 | ORAL_TABLET | Freq: Four times a day (QID) | ORAL | Status: DC | PRN

## 2013-12-20 MED ORDER — FINASTERIDE 5 MG PO TABS
5.0000 mg | ORAL_TABLET | Freq: Every day | ORAL | Status: DC
  Filled 2013-12-20: qty 1

## 2013-12-20 SURGICAL SUPPLY — 11 items
BAG URO CATCHER STRL LF (DRAPE) ×2 IMPLANT
CATH URET 5FR 28IN OPEN ENDED (CATHETERS) ×1 IMPLANT
CLOTH BEACON ORANGE TIMEOUT ST (SAFETY) ×3 IMPLANT
DRAPE CAMERA CLOSED 9X96 (DRAPES) ×2 IMPLANT
GLOVE SURG SS PI 7.5 STRL IVOR (GLOVE) ×1 IMPLANT
GLOVE SURG SS PI 8.0 STRL IVOR (GLOVE) ×1 IMPLANT
GOWN STRL REUS W/TWL XL LVL3 (GOWN DISPOSABLE) ×3 IMPLANT
MANIFOLD NEPTUNE II (INSTRUMENTS) ×2 IMPLANT
PACK CYSTO (CUSTOM PROCEDURE TRAY) ×2 IMPLANT
STENT CONTOUR 6FRX24X.038 (STENTS) ×1 IMPLANT
TUBING CONNECTING 10 (TUBING) ×2 IMPLANT

## 2013-12-20 NOTE — Transfer of Care (Signed)
Immediate Anesthesia Transfer of Care Note  Patient: Jared Norman  Procedure(s) Performed: Procedure(s): CYSTOSCOPY WITHLEFT RETROGRADE PYELOGRAM,  AND LEFT STENT PLACEMENT (Left)  Patient Location: PACU  Anesthesia Type:General  Level of Consciousness: awake, oriented, patient cooperative, lethargic and responds to stimulation  Airway & Oxygen Therapy: Patient Spontanous Breathing and Patient connected to face mask oxygen  Post-op Assessment: Report given to PACU RN, Post -op Vital signs reviewed and stable and Patient moving all extremities  Post vital signs: Reviewed and stable  Complications: No apparent anesthesia complications

## 2013-12-20 NOTE — H&P (Signed)
Active Problems Problems  1. Benign prostatic hyperplasia with urinary obstruction (600.01,599.69) 2. Nephrolithiasis (592.0) 3. Pyuria (791.9)  History of Present Illness Mr. Summerson is a 73 yo WM sent in consultation by Dietrich Pates NP for a 74mm LUPJ stone. He had the onset this morning of severe left flank pain and it didn't resolve with ibuprofen and he went to [facility] and got Toradol which helped. He had a KUB showed a stone and he was sent to [facility] for a CT which confirmed the presence of the stone. He has had no gross hematuria. He has had some nausea and vomiting. He is not hurting too bad now.  He had a stone about 20 years ago. He has no voiding complaints but he has BPH with BOO for which he takes finasteride.  He had e. coli on 2 UA's in January and February and required 2 rounds of antibiotics. He has some pyuria and bacturia today.   Past Medical History Problems  1. History of arthritis (V13.4) 2. History of diabetes mellitus (V12.29) 3. History of esophageal reflux (V12.79) 4. History of glaucoma (V12.49) 5. History of gout (V12.29) 6. History of hypercholesterolemia (V12.29) 7. History of hypertension (V12.59)  Surgical History Problems  1. History of Foot Surgery Right  Current Meds 1. AmLODIPine Besylate 5 MG Oral Tablet;  Therapy: (Recorded:10Aug2015) to Recorded 2. Aspirin 81 MG Oral Tablet;  Therapy: (Recorded:10Aug2015) to Recorded 3. Finasteride 5 MG Oral Tablet;  Therapy: (Recorded:10Aug2015) to Recorded 4. Lisinopril 40 MG Oral Tablet;  Therapy: (Recorded:10Aug2015) to Recorded 5. Magnesium 500 MG Oral Capsule;  Therapy: (Recorded:10Aug2015) to Recorded 6. OxyCODONE HCl CAPS;  Therapy: (Recorded:10Aug2015) to Recorded 7. Simvastatin 40 MG Oral Tablet;  Therapy: (Recorded:10Aug2015) to Recorded 8. Vitamin D-3 TABS;  Therapy: (Recorded:10Aug2015) to Recorded  Allergies Medication  1. No Known Drug Allergies  Family History Problems  1. Family history  of Deceased : Mother, Father 2. Family history of Hematuria, undiagnosed cause  Social History Problems    Caffeine use (V49.89)   Married   Never a smoker   No alcohol use   Number of children   Occupation  Review of Systems Genitourinary, constitutional, skin, eye, otolaryngeal, hematologic/lymphatic, cardiovascular, pulmonary, endocrine, musculoskeletal, gastrointestinal, neurological and psychiatric system(s) were reviewed and pertinent findings if present are noted.  Genitourinary: urinary frequency, nocturia, urinary stream starts and stops and erectile dysfunction.  Gastrointestinal: vomiting.    Vitals Vital Signs [Data Includes: Last 1 Day]  Recorded: 10Aug2015 02:49PM  Height: 5 ft 4 in Weight: 163 lb  BMI Calculated: 27.98 BSA Calculated: 1.79 Blood Pressure: 125 / 79 Temperature: 98.5 F Heart Rate: 101  Physical Exam Constitutional: Well nourished and well developed . No acute distress. Medicated.  ENT:. The ears and nose are normal in appearance.  Neck: The appearance of the neck is normal and no neck mass is present.  Pulmonary: No respiratory distress and normal respiratory rhythm and effort.  Cardiovascular: Heart rate and rhythm are normal . No peripheral edema.  Abdomen: The abdomen is soft and nontender. No masses are palpated. mild left CVA tenderness. No hernias are palpable. No hepatosplenomegaly noted.  Lymphatics: The supraclavicular and axillary nodes are not enlarged or tender.  Skin: Normal skin turgor, no visible rash and no visible skin lesions.  Neuro/Psych:. Mood and affect are appropriate. But medicated.    Results/Data Urine [Data Includes: Last 1 Day]   [id number]  COLOR YELLOW   APPEARANCE CLOUDY   SPECIFIC GRAVITY 1.020   pH 5.5  GLUCOSE 100 mg/dL  BILIRUBIN NEG   KETONE 15 mg/dL  BLOOD MOD   PROTEIN NEG mg/dL  UROBILINOGEN 0.2 mg/dL  NITRITE NEG   LEUKOCYTE ESTERASE SMALL   SQUAMOUS EPITHELIAL/HPF RARE   WBC 7-10  WBC/hpf  RBC 3-6 RBC/hpf  BACTERIA MODERATE   CRYSTALS NONE SEEN   CASTS NONE SEEN   Other AMORPHOUS    Old records or history reviewed: Records from [facility] reviewed.  The following images/tracing/specimen were independently visualized:  CT films and report reviewed.  The following clinical lab reports were reviewed:  UA and prior cultures reviewed.    Assessment Assessed  1. Nephrolithiasis (592.0) 2. Pyuria (791.9)  He has a 10 x 6 mm LUPJ stone with pain and obstruction and has a history of UTI's with some pyuria and bacturia today. He is tachycardic.   Plan Health Maintenance  1. UA With REFLEX; [Do Not Release]; Status:Resulted - Requires Verification;   Done:  [id number] 02:17PM Nephrolithiasis  2. Follow-up Schedule Surgery Office  Follow-up  Status: Hold For - Appointment   Requested for: [phone] Pyuria  3. URINE CULTURE; Status:In Progress - Specimen/Data Collected;   Done: [phone]  I am going to set him up for cystoscopy and left ureteral stenting tonight and reviewed the risks of bleeding, infection, ureteral injury, thrombotic events and anesthetic risks.     He will need ESWL in the near future but got Toradol today.  I reviewed the risks of bleeding, infection, renal and adjacent organ injury, need for secondary procedures and sedation risks.   Discussion/Summary CC: Dietrich Pates NP with [facility].

## 2013-12-20 NOTE — Anesthesia Preprocedure Evaluation (Addendum)
Anesthesia Evaluation  Patient identified by MRN, date of birth, ID band Patient awake    Reviewed: Allergy & Precautions, H&P , NPO status , Patient's Chart, lab work & pertinent test results  Airway Mallampati: II TM Distance: >3 FB Neck ROM: Full    Dental no notable dental hx.    Pulmonary neg pulmonary ROS,  breath sounds clear to auscultation  Pulmonary exam normal       Cardiovascular hypertension, Pt. on medications Rhythm:Regular Rate:Normal     Neuro/Psych negative neurological ROS  negative psych ROS   GI/Hepatic negative GI ROS, Neg liver ROS,   Endo/Other  diabetes, Type 2  Renal/GU negative Renal ROS     Musculoskeletal negative musculoskeletal ROS (+)   Abdominal   Peds  Hematology negative hematology ROS (+)   Anesthesia Other Findings   Reproductive/Obstetrics                          Anesthesia Physical Anesthesia Plan  ASA: II  Anesthesia Plan: General   Post-op Pain Management:    Induction: Intravenous  Airway Management Planned: LMA  Additional Equipment:   Intra-op Plan:   Post-operative Plan: Extubation in OR  Informed Consent: I have reviewed the patients History and Physical, chart, labs and discussed the procedure including the risks, benefits and alternatives for the proposed anesthesia with the patient or authorized representative who has indicated his/her understanding and acceptance.   Dental advisory given  Plan Discussed with: CRNA  Anesthesia Plan Comments:         Anesthesia Quick Evaluation

## 2013-12-20 NOTE — Progress Notes (Signed)
   Subjective:    Patient ID: MEZIAH BLASINGAME, male    DOB: [birth year], 73 y.o.   MRN: [id number]  HPI This 73 y.o. male presents for evaluation of back pain like the pain he had when he had kidney stone. The pain is on his left side and radiates into his left abdomen.  He has been nauseated.  The pain is severe.   Review of Systems C/o left abdominal pain and back pain.   No chest pain, SOB, HA, dizziness, vision change, N/V, diarrhea, constipation, dysuria, urinary urgency or frequency, myalgias, arthralgias or rash.  Objective:   Physical Exam Vital signs noted  Acutely ill male bent over c/o back pain.  HEENT - Head atraumatic Normocephalic                Eyes - PERRLA, Conjuctiva - clear Sclera- Clear EOMI                Ears - EAC's Wnl TM's Wnl Gross Hearing WNL                Throat - oropharanx wnl Respiratory - Lungs CTA bilateral Cardiac - RRR S1 and S2 without murmur GI - Abdomen tender left periumbilical region MS - TTP left CVA.  Results for orders placed in visit on 12/20/13  POCT URINALYSIS DIPSTICK      Result Value Ref Range   Color, UA yellow     Clarity, UA cloudy     Glucose, UA 250     Bilirubin, UA neg     Ketones, UA neg     Spec Grav, UA 1.010     Blood, UA large     pH, UA 7.5     Protein, UA 1+     Urobilinogen, UA negative     Nitrite, UA neg     Leukocytes, UA Trace    POCT UA - MICROSCOPIC ONLY      Result Value Ref Range   WBC, Ur, HPF, POC 5-7     RBC, urine, microscopic 10-15     Bacteria, U Microscopic neg     Mucus, UA occ     Epithelial cells, urine per micros rare     Crystals, Ur, HPF, POC neg     Casts, Ur, LPF, POC neg     Yeast, UA neg      Xray KUB - Large left stone in left abdomen possibly kidney stone Prelimnary reading by Gwyndolyn Saxon Sarabella Caprio,FNP       Assessment & Plan:  Kidney stone - Plan: POCT urinalysis dipstick, POCT UA - Microscopic Only, DG Abd 1 View, oxyCODONE-acetaminophen (ROXICET) 5-325 MG per tablet,  ketorolac (TORADOL) injection 60 mg, ondansetron (ZOFRAN ODT) 8 MG disintegrating tablet, CT Abd Limited W/O Cm  Hematuria - UA CX  Push po fluids, STAT CT of abdomen, and will call and give results and informed may need to see urology.  Lysbeth Penner FNP

## 2013-12-20 NOTE — Anesthesia Postprocedure Evaluation (Signed)
Anesthesia Post Note  Patient: Jared Norman  Procedure(s) Performed: Procedure(s) (LRB): CYSTOSCOPY WITHLEFT RETROGRADE PYELOGRAM,  AND LEFT STENT PLACEMENT (Left)  Anesthesia type: General  Patient location: PACU  Post pain: Pain level controlled  Post assessment: Post-op Vital signs reviewed  Last Vitals: BP 150/78  Pulse 88  Temp(Src) 36.6 C (Oral)  Resp 10  Ht 5\' 4"  (1.626 m)  Wt 166 lb 3.6 oz (75.4 kg)  BMI 28.52 kg/m2  SpO2 99%  Post vital signs: Reviewed  Level of consciousness: sedated  Complications: No apparent anesthesia complications

## 2013-12-20 NOTE — Brief Op Note (Signed)
12/20/2013  9:11 PM  PATIENT:  Jared Norman  73 y.o. male  PRE-OPERATIVE DIAGNOSIS:  LEFT URETERAL STONE, POSSIBLE UTI  POST-OPERATIVE DIAGNOSIS:  left ureteral stone   PROCEDURE:  Procedure(s): CYSTOSCOPY WITH RETROGRADE PYELOGRAM, URETEROSCOPY AND STENT PLACEMENT (Left)  SURGEON:  Surgeon(s) and Role:    * Malka So, MD - Primary  PHYSICIAN ASSISTANT:   ASSISTANTS: none   ANESTHESIA:   general  EBL:     BLOOD ADMINISTERED:none  DRAINS: 6 x 24 left JJ stent   LOCAL MEDICATIONS USED:  NONE  SPECIMEN:  No Specimen  DISPOSITION OF SPECIMEN:  N/A  COUNTS:  YES  TOURNIQUET:  * No tourniquets in log *  DICTATION: .Other Dictation: Dictation Number [id number]  PLAN OF CARE: Admit for overnight observation  PATIENT DISPOSITION:  PACU - hemodynamically stable.   Delay start of Pharmacological VTE agent (>24hrs) due to surgical blood loss or risk of bleeding: not applicable

## 2013-12-20 NOTE — Anesthesia Procedure Notes (Signed)
Procedure Name: LMA Insertion Date/Time: 12/20/2013 8:52 PM Performed by: Ofilia Neas Pre-anesthesia Checklist: Patient identified, Emergency Drugs available, Suction available, Patient being monitored and Timeout performed Patient Re-evaluated:Patient Re-evaluated prior to inductionOxygen Delivery Method: Circle system utilized Preoxygenation: Pre-oxygenation with 100% oxygen Intubation Type: IV induction LMA: LMA with gastric port inserted LMA Size: 4.0 Number of attempts: 1 Placement Confirmation: positive ETCO2 Tube secured with: Tape Dental Injury: Teeth and Oropharynx as per pre-operative assessment

## 2013-12-21 ENCOUNTER — Encounter (HOSPITAL_COMMUNITY): Payer: Self-pay | Admitting: Urology

## 2013-12-21 ENCOUNTER — Other Ambulatory Visit: Payer: Self-pay | Admitting: Urology

## 2013-12-21 ENCOUNTER — Encounter: Payer: Self-pay | Admitting: Family Medicine

## 2013-12-21 ENCOUNTER — Encounter (INDEPENDENT_AMBULATORY_CARE_PROVIDER_SITE_OTHER): Payer: Medicare HMO | Admitting: Ophthalmology

## 2013-12-21 DIAGNOSIS — N289 Disorder of kidney and ureter, unspecified: Secondary | ICD-10-CM | POA: Clinically undetermined

## 2013-12-21 DIAGNOSIS — N200 Calculus of kidney: Secondary | ICD-10-CM | POA: Diagnosis not present

## 2013-12-21 LAB — BASIC METABOLIC PANEL
Anion gap: 12 (ref 5–15)
BUN: 26 mg/dL — ABNORMAL HIGH (ref 6–23)
CO2: 24 mEq/L (ref 19–32)
Calcium: 9 mg/dL (ref 8.4–10.5)
Chloride: 98 mEq/L (ref 96–112)
Creatinine, Ser: 1.41 mg/dL — ABNORMAL HIGH (ref 0.50–1.35)
GFR calc Af Amer: 55 mL/min — ABNORMAL LOW (ref >90)
GFR calc non Af Amer: 48 mL/min — ABNORMAL LOW (ref >90)
Glucose, Bld: 225 mg/dL — ABNORMAL HIGH (ref 70–99)
Potassium: 5.1 mEq/L (ref 3.7–5.3)
Sodium: 134 mEq/L — ABNORMAL LOW (ref 137–147)

## 2013-12-21 LAB — CBC
HCT: 40.9 % (ref 39.0–52.0)
Hemoglobin: 14 g/dL (ref 13.0–17.0)
MCH: 31.2 pg (ref 26.0–34.0)
MCHC: 34.2 g/dL (ref 30.0–36.0)
MCV: 91.1 fL (ref 78.0–100.0)
Platelets: 217 10*3/uL (ref 150–400)
RBC: 4.49 MIL/uL (ref 4.22–5.81)
RDW: 13.5 % (ref 11.5–15.5)
WBC: 10.9 10*3/uL — ABNORMAL HIGH (ref 4.0–10.5)

## 2013-12-21 MED ORDER — CIPROFLOXACIN HCL 250 MG PO TABS: 250.0000 mg | ORAL_TABLET | Freq: Two times a day (BID) | ORAL | Status: DC

## 2013-12-21 NOTE — Discharge Instructions (Signed)
Ureteral Stent Implantation, Care After °Refer to this sheet in the next few weeks. These instructions provide you with information on caring for yourself after your procedure. Your health care provider may also give you more specific instructions. Your treatment has been planned according to current medical practices, but problems sometimes occur. Call your health care provider if you have any problems or questions after your procedure. °WHAT TO EXPECT AFTER THE PROCEDURE °You should be back to normal activity within 48 hours after the procedure. Nausea and vomiting may occur and are commonly the result of anesthesia. °It is common to experience sharp pain in the back or lower abdomen and penis with voiding. This is caused by movement of the ends of the stent with the act of urinating. It usually goes away within minutes after you have stopped urinating. °HOME CARE INSTRUCTIONS °Make sure to drink plenty of fluids. You may have small amounts of bleeding, causing your urine to be red. This is normal. Certain movements may trigger pain or a feeling that you need to urinate. You may be given medicines to prevent infection or bladder spasms. Be sure to take all medicines as directed. Only take over-the-counter or prescription medicines for pain, discomfort, or fever as directed by your health care provider. Do not take aspirin, as this can make bleeding worse. °Your stent will be left in until the blockage is resolved. This may take 2 weeks or longer, depending on the reason for stent implantation. You may have an X-ray exam to make sure your ureter is open and that the stent has not moved out of position (migrated). The stent can be removed by your health care provider in the office. Medicines may be given for comfort while the stent is being removed. Be sure to keep all follow-up appointments so your health care provider can check that you are healing properly. °SEEK MEDICAL CARE IF: °· You experience increasing  pain. °· Your pain medicine is not working. °SEEK IMMEDIATE MEDICAL CARE IF: °· Your urine is dark red or has blood clots. °· You are leaking urine (incontinent). °· You have a fever, chills, feeling sick to your stomach (nausea), or vomiting. °· Your pain is not relieved by pain medicine. °· The end of the stent comes out of the urethra. °· You are unable to urinate. °Document Released: 12/30/2012 Document Revised: 05/04/2013 Document Reviewed: 12/30/2012 °Exit[employer]® Patient Information ©2015 [employer]. This information is not intended to replace advice given to you by your health care provider. Make sure you discuss any questions you have with your health care provider. ° °

## 2013-12-21 NOTE — Discharge Summary (Signed)
Physician Discharge Summary  Patient ID: Jared Norman MRN: [id number] DOB/AGE: 1940/05/29 73 y.o.  Admit date: 12/20/2013 Discharge date: 12/21/2013  Admission Diagnoses:  Renal stone  Discharge Diagnoses:  Principal Problem:   Renal stone Active Problems:   Acute renal insufficiency   Past Medical History  Diagnosis Date  . BPH (benign prostatic hyperplasia)   . Vitamin D deficiency   . Diabetes mellitus without complication   . Hyperlipidemia   . Hypertension     Surgeries: Procedure(s): CYSTOSCOPY WITHLEFT RETROGRADE PYELOGRAM,  AND LEFT STENT PLACEMENT on 12/20/2013   Consultants (if any):    Discharged Condition: Improved  Hospital Course: Jared Norman is an 73 y.o. male who was admitted 12/20/2013 with a diagnosis of Renal stone and went to the operating room on 12/20/2013 and underwent the above named procedures.  He had some pyuria and a history of UTI and it was felt that stenting was needed to decompress the kidney prior to subsequent ESWL.   He did well with the procedure and has no complaints this morning.   His Cr did bump to 1.41 from a baseline of about 1.2 and he has a mild leukocytosis but no fever.   A urine culture is pending from my office.   He is felt to be ready for discharge.   He was given perioperative antibiotics:      Anti-infectives   Start     Dose/Rate Route Frequency Ordered Stop   12/21/13 0000  ciprofloxacin (CIPRO) 250 MG tablet     250 mg Oral 2 times daily 12/21/13 0730     12/20/13 2230  ciprofloxacin (CIPRO) tablet 250 mg     250 mg Oral 2 times daily 12/20/13 2214     12/20/13 1641  ciprofloxacin (CIPRO) IVPB 400 mg     400 mg 200 mL/hr over 60 Minutes Intravenous 60 min pre-op 12/20/13 1641 12/20/13 2030    .  He was given sequential compression devices and early ambulation for DVT prophylaxis.  He benefited maximally from the hospital stay and there were no complications.    Recent vital signs:  Filed Vitals:    12/21/13 0652  BP: 162/77  Pulse: 96  Temp: 98 F (36.7 C)  Resp: 18    Recent laboratory studies:  Lab Results  Component Value Date   HGB 14.0 12/21/2013   Lab Results  Component Value Date   WBC 10.9* 12/21/2013   PLT 217 12/21/2013   No results found for this basename: INR   Lab Results  Component Value Date   NA 134* 12/21/2013   K 5.1 12/21/2013   CL 98 12/21/2013   CO2 24 12/21/2013   BUN 26* 12/21/2013   CREATININE 1.41* 12/21/2013   GLUCOSE 225* 12/21/2013    Discharge Medications:     Medication List         amLODipine 5 MG tablet  Commonly known as:  NORVASC  Take 1 tablet (5 mg total) by mouth daily.     aspirin 81 MG tablet  Take 81 mg by mouth See admin instructions. One tablet three times a week.     ciprofloxacin 250 MG tablet  Commonly known as:  CIPRO  Take 1 tablet (250 mg total) by mouth 2 (two) times daily.     finasteride 5 MG tablet  Commonly known as:  PROSCAR  Take 1 tablet (5 mg total) by mouth daily.     ibuprofen 200 MG tablet  Commonly known as:  ADVIL,MOTRIN  Take 800 mg by mouth every 6 (six) hours as needed for moderate pain.     lisinopril 40 MG tablet  Commonly known as:  PRINIVIL,ZESTRIL  Take 1 pill day     magnesium gluconate 500 MG tablet  Commonly known as:  MAGONATE  Take 500 mg by mouth daily.     metFORMIN 1000 MG tablet  Commonly known as:  GLUCOPHAGE  Take 1 tablet (1,000 mg total) by mouth daily with breakfast.     ondansetron 8 MG disintegrating tablet  Commonly known as:  ZOFRAN ODT  Take 1 tablet (8 mg total) by mouth every 8 (eight) hours as needed for nausea or vomiting.     oxyCODONE-acetaminophen 5-325 MG per tablet  Commonly known as:  ROXICET  Take 1-2 tablets by mouth every 8 (eight) hours as needed for severe pain.     promethazine 25 MG tablet  Commonly known as:  PHENERGAN  Take 1 tablet (25 mg total) by mouth every 8 (eight) hours as needed for nausea or vomiting.     simvastatin 40 MG  tablet  Commonly known as:  ZOCOR  Take 20 mg by mouth at bedtime.     Vitamin D-3 5000 UNITS Tabs  Take 1 tablet by mouth daily.        Diagnostic Studies: Ct Abdomen Pelvis Wo Contrast  12/20/2013   CLINICAL DATA:  Left flank pain today.  EXAM: CT ABDOMEN AND PELVIS WITHOUT CONTRAST  TECHNIQUE: Multidetector CT imaging of the abdomen and pelvis was performed following the standard protocol without IV contrast.  COMPARISON:  Office abdominal radiograph 12/20/2013.  FINDINGS: Lung bases: Clear. No significant pleural or pericardial effusion.  Kidneys / Ureters / Bladder: There is a 10 mm obstructing calculus at the left ureteral pelvic junction. This is visible on the scout image, at the level of the left L2 pedicle, as on today's radiographs. There are additional tiny left renal calculi. No right-sided urinary tract calculi are present. There is moderate dilatation of the left renal pelvis with associated asymmetric perinephric soft tissue stranding. No distal ureteral or bladder calculi are seen. 11 mm low-density lesion in the lower pole of the right kidney on image 38 is incompletely characterized, but likely a cyst.  Other Solid Abdominal Viscera: As evaluated in the noncontrast state, the liver demonstrates no abnormality. Small gallstones are noted. There is no gallbladder wall thickening or biliary dilatation. The pancreas appears normal. The spleen and adrenal glands appear normal.  Bowel/Mesentery: There is a small hiatal hernia with mild distal esophageal wall thickening. The stomach and small bowel demonstrate no significant abnormalities. The appendix appears normal. There are mild diverticular changes of the sigmoid colon. No ascites or peritoneal nodularity.  Retroperitoneum/Pelvis: There are no enlarged abdominal or pelvic lymph nodes. There is atherosclerosis of the aorta, its branches and the iliac arteries. There are prominent calcifications of the vas deferens. The prostate gland is  mildly enlarged. No abdominal wall masses or hernias.  Bones / Musculoskeletal: No acute or significant osseus findings. Degenerative changes are present throughout the spine.  IMPRESSION: 1. Obstructing 10 mm calculus at the left ureteral pelvic junction. 2. There are additional small left renal calculi. 3. Small low-density lesion in the lower pole of the right kidney, likely a cyst. 4. Cholelithiasis, atherosclerosis and hiatal hernia noted.   Electronically Signed   By: Camie Patience M.D.   On: 12/20/2013 11:05   Dg Abd 1 View  12/20/2013   CLINICAL  DATA:  Left flank pain  EXAM: ABDOMEN - 1 VIEW  COMPARISON:  CT abdomen 12/20/2013  FINDINGS: There is no bowel dilatation to suggest obstruction. There is no evidence of pneumoperitoneum, portal venous gas or pneumatosis. There is a 11.5 mm calculus projecting over the left UPJ. There is mild lumbar spine spondylosis.  IMPRESSION: 1. 11.5 mm calculus projecting over the left UPJ.   Electronically Signed   By: Kathreen Devoid   On: 12/20/2013 10:58    Disposition: Home  Discharge Instructions   Discontinue IV    Complete by:  As directed            Follow-up Information   Follow up with Malka So, MD. (My office will call to schedule the next procedure. )    Specialty:  Urology   Contact information:   [place] [address] [phone]        Signed: Malka So 12/21/2013, 7:34 AM

## 2013-12-21 NOTE — Op Note (Signed)
Jared Norman, Jared Norman                ACCOUNT NO.:  [id number]  MEDICAL RECORD NO.:  [id number]  LOCATION:  25                         FACILITY:  [facility]  PHYSICIAN:  Marshall Cork. Jeffie Pollock, M.D.    DATE OF BIRTH:  15-May-1940  DATE OF PROCEDURE:  12/20/2013 DATE OF DISCHARGE:  12/20/2013                              OPERATIVE REPORT   PROCEDURE:  Cystoscopy left retrograde pyelogram with interpretation insertion of left double-J.  PREOPERATIVE DIAGNOSIS:  Left ureteral stone with possible.  POSTOPERATIVE DIAGNOSIS:  Left ureteral stone with possible.  SURGEON:  Marshall Cork. Jeffie Pollock, M.D.  ANESTHESIA:  General.  SPECIMEN:  None.  DRAINS:  A 6-French 24 cm left double-J stent.  COMPLICATIONS:  None.  INDICATIONS:  [name] is a 73 year old white male, who presents today with 10 x 6 mm left ureteral stone.  He has a history of infection earlier this year and had pyuria on his urinalysis.  It was felt that cystoscopy and stenting was indicated to relieve the obstruction and face the possible infection with plan for lithotripsy to be done sometime in the next week or 2.  FINDINGS AND PROCEDURE:  He was given Cipro.  He was taken to the operating room where general anesthetic was induced.  He was placed in lithotomy position.  His perineum and genitalia were prepped with Betadine solution.  He was fitted with PAS hose.  Draped in usual sterile fashion.  Time-out was performed.  Cystoscopy was performed using a 22-French scope and 12-degree lens. Examination revealed a normal urethra.  The external sphincter was intact.  The prostatic urethra was approximately 3 cm in length with trilobar hyperplasia with some obstruction.  Examination of bladder revealed mild trabeculation.  No tumors, stones, or inflammation were noted.  Ureteral orifices were unremarkable.  The left ureteral orifice was cannulated with a 5-French open-end catheter and contrast was instilled.  This demonstrated a normal  ureter at approximately the level UPJ where the stone could be seen with contrast going around.  There was mild proximal dilation.  Once the retrograde was performed.  A guidewire was passed to the kidney and a 6-French 24 cm double-J stent was then inserted over the wire to the kidney under fluoroscopic guidance.  The wire was removed leaving good coil in the kidney and a good coil in the bladder.  The bladder was then drained.  The patient was taken down from lithotomy position.  His anesthetic was reversed.  He was moved to recovery room in stable condition.  There were no complications.     Marshall Cork. Jeffie Pollock, M.D.     JJW/MEDQ  D:  12/20/2013  T:  12/21/2013  Job:  [id number]

## 2013-12-22 ENCOUNTER — Encounter (HOSPITAL_COMMUNITY): Payer: Self-pay | Admitting: Pharmacy Technician

## 2013-12-23 ENCOUNTER — Encounter (HOSPITAL_COMMUNITY): Payer: Self-pay | Admitting: General Practice

## 2013-12-23 LAB — GLUCOSE, CAPILLARY: Glucose-Capillary: 230 mg/dL — ABNORMAL HIGH (ref 70–99)

## 2013-12-24 ENCOUNTER — Encounter (INDEPENDENT_AMBULATORY_CARE_PROVIDER_SITE_OTHER): Payer: Medicare HMO | Admitting: Ophthalmology

## 2013-12-24 DIAGNOSIS — I10 Essential (primary) hypertension: Secondary | ICD-10-CM

## 2013-12-24 DIAGNOSIS — E11311 Type 2 diabetes mellitus with unspecified diabetic retinopathy with macular edema: Secondary | ICD-10-CM

## 2013-12-24 DIAGNOSIS — E1139 Type 2 diabetes mellitus with other diabetic ophthalmic complication: Secondary | ICD-10-CM

## 2013-12-24 DIAGNOSIS — E1165 Type 2 diabetes mellitus with hyperglycemia: Secondary | ICD-10-CM

## 2013-12-24 DIAGNOSIS — H43819 Vitreous degeneration, unspecified eye: Secondary | ICD-10-CM

## 2013-12-24 DIAGNOSIS — E11359 Type 2 diabetes mellitus with proliferative diabetic retinopathy without macular edema: Secondary | ICD-10-CM

## 2013-12-24 DIAGNOSIS — H35039 Hypertensive retinopathy, unspecified eye: Secondary | ICD-10-CM

## 2013-12-24 LAB — URINE CULTURE

## 2013-12-27 ENCOUNTER — Encounter (HOSPITAL_COMMUNITY): Payer: Self-pay | Admitting: General Practice

## 2013-12-27 ENCOUNTER — Ambulatory Visit (HOSPITAL_COMMUNITY): Payer: Medicare HMO

## 2013-12-27 ENCOUNTER — Other Ambulatory Visit: Payer: Self-pay | Admitting: Family

## 2013-12-27 ENCOUNTER — Encounter (HOSPITAL_COMMUNITY)
Admission: RE | Disposition: A | Discharge status: Home / Self Care | Payer: Self-pay | Source: Ambulatory Visit | Attending: Urology

## 2013-12-27 ENCOUNTER — Ambulatory Visit (HOSPITAL_COMMUNITY)
Admission: RE | Admit: 2013-12-27 | Discharge: 2013-12-27 | Disposition: A | Discharge status: Home / Self Care | Payer: Medicare HMO | Source: Ambulatory Visit | Attending: Urology | Admitting: Urology

## 2013-12-27 DIAGNOSIS — Z7982 Long term (current) use of aspirin: Secondary | ICD-10-CM | POA: Insufficient documentation

## 2013-12-27 DIAGNOSIS — N139 Obstructive and reflux uropathy, unspecified: Secondary | ICD-10-CM | POA: Insufficient documentation

## 2013-12-27 DIAGNOSIS — M109 Gout, unspecified: Secondary | ICD-10-CM | POA: Diagnosis not present

## 2013-12-27 DIAGNOSIS — I10 Essential (primary) hypertension: Secondary | ICD-10-CM | POA: Diagnosis not present

## 2013-12-27 DIAGNOSIS — N401 Enlarged prostate with lower urinary tract symptoms: Secondary | ICD-10-CM | POA: Insufficient documentation

## 2013-12-27 DIAGNOSIS — Z79899 Other long term (current) drug therapy: Secondary | ICD-10-CM | POA: Diagnosis not present

## 2013-12-27 DIAGNOSIS — E7800 Pure hypercholesterolemia, unspecified: Secondary | ICD-10-CM | POA: Insufficient documentation

## 2013-12-27 DIAGNOSIS — E119 Type 2 diabetes mellitus without complications: Secondary | ICD-10-CM | POA: Diagnosis not present

## 2013-12-27 DIAGNOSIS — N200 Calculus of kidney: Secondary | ICD-10-CM | POA: Insufficient documentation

## 2013-12-27 DIAGNOSIS — K219 Gastro-esophageal reflux disease without esophagitis: Secondary | ICD-10-CM | POA: Diagnosis not present

## 2013-12-27 DIAGNOSIS — N138 Other obstructive and reflux uropathy: Secondary | ICD-10-CM | POA: Insufficient documentation

## 2013-12-27 DIAGNOSIS — H409 Unspecified glaucoma: Secondary | ICD-10-CM | POA: Diagnosis not present

## 2013-12-27 DIAGNOSIS — R82998 Other abnormal findings in urine: Secondary | ICD-10-CM | POA: Insufficient documentation

## 2013-12-27 HISTORY — DX: Calculus of kidney: N20.0

## 2013-12-27 LAB — GLUCOSE, CAPILLARY: Glucose-Capillary: 116 mg/dL — ABNORMAL HIGH (ref 70–99)

## 2013-12-27 SURGERY — LITHOTRIPSY, ESWL
Anesthesia: LOCAL | Laterality: Left

## 2013-12-27 MED ORDER — DIAZEPAM 5 MG PO TABS
10.0000 mg | ORAL_TABLET | ORAL | Status: AC
  Administered 2013-12-27: 10 mg via ORAL
  Filled 2013-12-27: qty 2

## 2013-12-27 MED ORDER — DEXTROSE-NACL 5-0.45 % IV SOLN
INTRAVENOUS | Status: DC
  Administered 2013-12-27: 10:00:00 via INTRAVENOUS

## 2013-12-27 MED ORDER — OXYCODONE-ACETAMINOPHEN 5-325 MG PO TABS: 1.0000 | ORAL_TABLET | ORAL | Status: DC | PRN

## 2013-12-27 MED ORDER — OXYCODONE HCL 5 MG PO TABS: 5.0000 mg | ORAL_TABLET | ORAL | Status: DC | PRN

## 2013-12-27 MED ORDER — DIPHENHYDRAMINE HCL 25 MG PO CAPS
25.0000 mg | ORAL_CAPSULE | ORAL | Status: AC
  Administered 2013-12-27: 25 mg via ORAL
  Filled 2013-12-27: qty 1

## 2013-12-27 MED ORDER — NITROFURANTOIN MONOHYD MACRO 100 MG PO CAPS: 100.0000 mg | ORAL_CAPSULE | Freq: Two times a day (BID) | ORAL | Status: DC

## 2013-12-27 MED ORDER — FENTANYL CITRATE 0.05 MG/ML IJ SOLN: 25.0000 ug | INTRAMUSCULAR | Status: DC | PRN

## 2013-12-27 MED ORDER — SODIUM CHLORIDE 0.9 % IV SOLN: 250.0000 mL | INTRAVENOUS | Status: DC | PRN

## 2013-12-27 MED ORDER — CIPROFLOXACIN HCL 500 MG PO TABS
500.0000 mg | ORAL_TABLET | ORAL | Status: AC
  Administered 2013-12-27: 500 mg via ORAL
  Filled 2013-12-27: qty 1

## 2013-12-27 MED ORDER — ACETAMINOPHEN 650 MG RE SUPP
650.0000 mg | RECTAL | Status: DC | PRN
  Filled 2013-12-27: qty 1

## 2013-12-27 MED ORDER — SODIUM CHLORIDE 0.9 % IJ SOLN: 3.0000 mL | Freq: Two times a day (BID) | INTRAMUSCULAR | Status: DC

## 2013-12-27 MED ORDER — SODIUM CHLORIDE 0.9 % IJ SOLN: 3.0000 mL | INTRAMUSCULAR | Status: DC | PRN

## 2013-12-27 MED ORDER — ACETAMINOPHEN 325 MG PO TABS: 650.0000 mg | ORAL_TABLET | ORAL | Status: DC | PRN

## 2013-12-27 NOTE — H&P (View-Only) (Signed)
Active Problems Problems  1. Benign prostatic hyperplasia with urinary obstruction (600.01,599.69) 2. Nephrolithiasis (592.0) 3. Pyuria (791.9)  History of Present Illness [name] is a 73 yo WM sent in consultation by Dietrich Pates NP for a 76mm LUPJ stone. He had the onset this morning of severe left flank pain and it didn't resolve with ibuprofen and he went to [facility] and got Toradol which helped. He had a KUB showed a stone and he was sent to [facility] for a CT which confirmed the presence of the stone. He has had no gross hematuria. He has had some nausea and vomiting. He is not hurting too bad now.  He had a stone about 20 years ago. He has no voiding complaints but he has BPH with BOO for which he takes finasteride.  He had e. coli on 2 UA's in January and February and required 2 rounds of antibiotics. He has some pyuria and bacturia today.   Past Medical History Problems  1. History of arthritis (V13.4) 2. History of diabetes mellitus (V12.29) 3. History of esophageal reflux (V12.79) 4. History of glaucoma (V12.49) 5. History of gout (V12.29) 6. History of hypercholesterolemia (V12.29) 7. History of hypertension (V12.59)  Surgical History Problems  1. History of Foot Surgery Right  Current Meds 1. AmLODIPine Besylate 5 MG Oral Tablet;  Therapy: (Recorded:10Aug2015) to Recorded 2. Aspirin 81 MG Oral Tablet;  Therapy: (Recorded:10Aug2015) to Recorded 3. Finasteride 5 MG Oral Tablet;  Therapy: (Recorded:10Aug2015) to Recorded 4. Lisinopril 40 MG Oral Tablet;  Therapy: (Recorded:10Aug2015) to Recorded 5. Magnesium 500 MG Oral Capsule;  Therapy: (Recorded:10Aug2015) to Recorded 6. OxyCODONE HCl CAPS;  Therapy: (Recorded:10Aug2015) to Recorded 7. Simvastatin 40 MG Oral Tablet;  Therapy: (Recorded:10Aug2015) to Recorded 8. Vitamin D-3 TABS;  Therapy: (Recorded:10Aug2015) to Recorded  Allergies Medication  1. No Known Drug Allergies  Family History Problems  1. Family history  of Deceased : Mother, Father 2. Family history of Hematuria, undiagnosed cause  Social History Problems    Caffeine use (V49.89)   Married   Never a smoker   No alcohol use   Number of children   Occupation  Review of Systems Genitourinary, constitutional, skin, eye, otolaryngeal, hematologic/lymphatic, cardiovascular, pulmonary, endocrine, musculoskeletal, gastrointestinal, neurological and psychiatric system(s) were reviewed and pertinent findings if present are noted.  Genitourinary: urinary frequency, nocturia, urinary stream starts and stops and erectile dysfunction.  Gastrointestinal: vomiting.    Vitals Vital Signs [Data Includes: Last 1 Day]  Recorded: 10Aug2015 02:49PM  Height: 5 ft 4 in Weight: 163 lb  BMI Calculated: 27.98 BSA Calculated: 1.79 Blood Pressure: 125 / 79 Temperature: 98.5 F Heart Rate: 101  Physical Exam Constitutional: Well nourished and well developed . No acute distress. Medicated.  ENT:. The ears and nose are normal in appearance.  Neck: The appearance of the neck is normal and no neck mass is present.  Pulmonary: No respiratory distress and normal respiratory rhythm and effort.  Cardiovascular: Heart rate and rhythm are normal . No peripheral edema.  Abdomen: The abdomen is soft and nontender. No masses are palpated. mild left CVA tenderness. No hernias are palpable. No hepatosplenomegaly noted.  Lymphatics: The supraclavicular and axillary nodes are not enlarged or tender.  Skin: Normal skin turgor, no visible rash and no visible skin lesions.  Neuro/Psych:. Mood and affect are appropriate. But medicated.    Results/Data Urine [Data Includes: Last 1 Day]   [id number]  COLOR YELLOW   APPEARANCE CLOUDY   SPECIFIC GRAVITY 1.020   pH 5.5  GLUCOSE 100 mg/dL  BILIRUBIN NEG   KETONE 15 mg/dL  BLOOD MOD   PROTEIN NEG mg/dL  UROBILINOGEN 0.2 mg/dL  NITRITE NEG   LEUKOCYTE ESTERASE SMALL   SQUAMOUS EPITHELIAL/HPF RARE   WBC 7-10  WBC/hpf  RBC 3-6 RBC/hpf  BACTERIA MODERATE   CRYSTALS NONE SEEN   CASTS NONE SEEN   Other AMORPHOUS    Old records or history reviewed: [facility] from [facility] reviewed.  The following images/tracing/specimen were independently visualized:  CT films and report reviewed.  The following clinical lab reports were reviewed:  UA and prior cultures reviewed.    Assessment Assessed  1. Nephrolithiasis (592.0) 2. Pyuria (791.9)  He has a 10 x 6 mm LUPJ stone with pain and obstruction and has a history of UTI's with some pyuria and bacturia today. He is tachycardic.   Plan Health Maintenance  1. UA With REFLEX; [Do Not Release]; Status:Resulted - Requires Verification;   Done:  [id number] 02:17PM Nephrolithiasis  2. Follow-up Schedule Surgery Office  Follow-up  Status: Hold For - Appointment   Requested for: [phone] Pyuria  3. URINE CULTURE; Status:In Progress - Specimen/Data Collected;   Done: [phone]  I am going to set him up for cystoscopy and left ureteral stenting tonight and reviewed the risks of bleeding, infection, ureteral injury, thrombotic events and anesthetic risks.     He will need ESWL in the near future but got Toradol today.  I reviewed the risks of bleeding, infection, renal and adjacent organ injury, need for secondary procedures and sedation risks.   Discussion/Summary CC: Dietrich Pates NP with [facility].

## 2013-12-27 NOTE — Discharge Instructions (Signed)

## 2013-12-27 NOTE — Interval H&P Note (Signed)
History and Physical Interval Note:  His office culture grew staph and he was started on Keflex last week.   He had a stent placed last Monday.   12/27/2013 12:40 PM  Jared Norman  has presented today for surgery, with the diagnosis of LEFT RENAL STONE   The various methods of treatment have been discussed with the patient and family. After consideration of risks, benefits and other options for treatment, the patient has consented to  Procedure(s): LEFT EXTRACORPOREAL SHOCK WAVE LITHOTRIPSY (ESWL) (Left) as a surgical intervention .  The patient's history has been reviewed, patient examined, no change in status, stable for surgery.  I have reviewed the patient's chart and labs.  Questions were answered to the patient's satisfaction.     Jared Norman

## 2014-02-01 ENCOUNTER — Encounter (INDEPENDENT_AMBULATORY_CARE_PROVIDER_SITE_OTHER): Payer: Medicare HMO | Admitting: Ophthalmology

## 2014-02-01 DIAGNOSIS — E11359 Type 2 diabetes mellitus with proliferative diabetic retinopathy without macular edema: Secondary | ICD-10-CM

## 2014-02-01 DIAGNOSIS — E11311 Type 2 diabetes mellitus with unspecified diabetic retinopathy with macular edema: Secondary | ICD-10-CM

## 2014-02-01 DIAGNOSIS — H35039 Hypertensive retinopathy, unspecified eye: Secondary | ICD-10-CM

## 2014-02-01 DIAGNOSIS — E1139 Type 2 diabetes mellitus with other diabetic ophthalmic complication: Secondary | ICD-10-CM

## 2014-02-01 DIAGNOSIS — E1165 Type 2 diabetes mellitus with hyperglycemia: Secondary | ICD-10-CM

## 2014-02-01 DIAGNOSIS — H43819 Vitreous degeneration, unspecified eye: Secondary | ICD-10-CM

## 2014-02-01 DIAGNOSIS — I10 Essential (primary) hypertension: Secondary | ICD-10-CM

## 2014-03-01 ENCOUNTER — Encounter (INDEPENDENT_AMBULATORY_CARE_PROVIDER_SITE_OTHER): Payer: Medicare HMO | Admitting: Ophthalmology

## 2014-03-01 DIAGNOSIS — E11311 Type 2 diabetes mellitus with unspecified diabetic retinopathy with macular edema: Secondary | ICD-10-CM

## 2014-03-01 DIAGNOSIS — E11351 Type 2 diabetes mellitus with proliferative diabetic retinopathy with macular edema: Secondary | ICD-10-CM

## 2014-03-01 DIAGNOSIS — I10 Essential (primary) hypertension: Secondary | ICD-10-CM

## 2014-03-01 DIAGNOSIS — H43813 Vitreous degeneration, bilateral: Secondary | ICD-10-CM

## 2014-03-01 DIAGNOSIS — E11359 Type 2 diabetes mellitus with proliferative diabetic retinopathy without macular edema: Secondary | ICD-10-CM

## 2014-03-01 DIAGNOSIS — H35033 Hypertensive retinopathy, bilateral: Secondary | ICD-10-CM

## 2014-03-31 ENCOUNTER — Encounter (INDEPENDENT_AMBULATORY_CARE_PROVIDER_SITE_OTHER): Payer: Medicare HMO | Admitting: Ophthalmology

## 2014-03-31 DIAGNOSIS — E11359 Type 2 diabetes mellitus with proliferative diabetic retinopathy without macular edema: Secondary | ICD-10-CM

## 2014-03-31 DIAGNOSIS — H43813 Vitreous degeneration, bilateral: Secondary | ICD-10-CM

## 2014-03-31 DIAGNOSIS — E11351 Type 2 diabetes mellitus with proliferative diabetic retinopathy with macular edema: Secondary | ICD-10-CM

## 2014-03-31 DIAGNOSIS — H35033 Hypertensive retinopathy, bilateral: Secondary | ICD-10-CM

## 2014-03-31 DIAGNOSIS — I10 Essential (primary) hypertension: Secondary | ICD-10-CM

## 2014-03-31 DIAGNOSIS — E11311 Type 2 diabetes mellitus with unspecified diabetic retinopathy with macular edema: Secondary | ICD-10-CM

## 2014-04-19 ENCOUNTER — Ambulatory Visit (INDEPENDENT_AMBULATORY_CARE_PROVIDER_SITE_OTHER): Payer: Medicare HMO | Admitting: Internal Medicine

## 2014-04-19 VITALS — BP 128/74 | HR 73 | Temp 98.1°F | Resp 18 | Ht 65.5 in | Wt 164.0 lb

## 2014-04-19 DIAGNOSIS — E119 Type 2 diabetes mellitus without complications: Secondary | ICD-10-CM

## 2014-04-19 DIAGNOSIS — Z79899 Other long term (current) drug therapy: Secondary | ICD-10-CM

## 2014-04-19 DIAGNOSIS — Z23 Encounter for immunization: Secondary | ICD-10-CM

## 2014-04-19 DIAGNOSIS — I10 Essential (primary) hypertension: Secondary | ICD-10-CM

## 2014-04-19 DIAGNOSIS — E559 Vitamin D deficiency, unspecified: Secondary | ICD-10-CM

## 2014-04-19 LAB — COMPREHENSIVE METABOLIC PANEL
ALT: 20 U/L (ref 0–53)
AST: 20 U/L (ref 0–37)
Albumin: 4.2 g/dL (ref 3.5–5.2)
Alkaline Phosphatase: 69 U/L (ref 39–117)
BUN: 18 mg/dL (ref 6–23)
CO2: 27 mEq/L (ref 19–32)
Calcium: 9.2 mg/dL (ref 8.4–10.5)
Chloride: 102 mEq/L (ref 96–112)
Creat: 1.06 mg/dL (ref 0.50–1.35)
Glucose, Bld: 101 mg/dL — ABNORMAL HIGH (ref 70–99)
Potassium: 5.1 mEq/L (ref 3.5–5.3)
Sodium: 138 mEq/L (ref 135–145)
Total Bilirubin: 1.3 mg/dL — ABNORMAL HIGH (ref 0.2–1.2)
Total Protein: 6.7 g/dL (ref 6.0–8.3)

## 2014-04-19 LAB — LIPID PANEL
Cholesterol: 138 mg/dL (ref 0–200)
HDL: 40 mg/dL (ref >39)
LDL Cholesterol: 83 mg/dL (ref 0–99)
Total CHOL/HDL Ratio: 3.5 Ratio
Triglycerides: 75 mg/dL (ref <150)
VLDL: 15 mg/dL (ref 0–40)

## 2014-04-19 LAB — POCT CBC
Granulocyte percent: 57.1 %G (ref 37–80)
HCT, POC: 45.6 % (ref 43.5–53.7)
Hemoglobin: 15.1 g/dL (ref 14.1–18.1)
Lymph, poc: 2.7 (ref 0.6–3.4)
MCH, POC: 31.1 pg (ref 27–31.2)
MCHC: 33.2 g/dL (ref 31.8–35.4)
MCV: 93.6 fL (ref 80–97)
MID (cbc): 0.7 (ref 0–0.9)
MPV: 7.4 fL (ref 0–99.8)
POC Granulocyte: 4.5 (ref 2–6.9)
POC LYMPH PERCENT: 34 %L (ref 10–50)
POC MID %: 8.9 %M (ref 0–12)
Platelet Count, POC: 240 10*3/uL (ref 142–424)
RBC: 4.87 M/uL (ref 4.69–6.13)
RDW, POC: 13.3 %
WBC: 7.9 10*3/uL (ref 4.6–10.2)

## 2014-04-19 LAB — GLUCOSE, POCT (MANUAL RESULT ENTRY): POC Glucose: 106 mg/dl — AB (ref 70–99)

## 2014-04-19 LAB — VITAMIN D 25 HYDROXY (VIT D DEFICIENCY, FRACTURES): Vit D, 25-Hydroxy: 51 ng/mL (ref 30–100)

## 2014-04-19 LAB — POCT GLYCOSYLATED HEMOGLOBIN (HGB A1C): Hemoglobin A1C: 6.3

## 2014-04-19 NOTE — Patient Instructions (Addendum)
Influenza Vaccine (Flu Vaccine, Inactivated or Recombinant) 2014-2015: What You Need to Know 1. Why get vaccinated? Influenza ("flu") is a contagious disease that spreads around the [place] every winter, usually between October and May. Flu is caused by influenza viruses, and is spread mainly by coughing, sneezing, and close contact. Anyone can get flu, but the risk of getting flu is highest among children. Symptoms come on suddenly and may last several days. They can include:  fever/chills  sore throat  muscle aches  fatigue  cough  headache  runny or stuffy nose Flu can make some people much sicker than others. These people include young children, people 65 and older, pregnant women, and people with certain health conditions-such as heart, lung or kidney disease, nervous system disorders, or a weakened immune system. Flu vaccination is especially important for these people, and anyone in close contact with them. Flu can also lead to pneumonia, and make existing medical conditions worse. It can cause diarrhea and seizures in children. Each year thousands of people in the [place] die from flu, and many more are hospitalized. Flu vaccine is the best protection against flu and its complications. Flu vaccine also helps prevent spreading flu from person to person. 2. Inactivated and recombinant flu vaccines You are getting an injectable flu vaccine, which is either an "inactivated" or "recombinant" vaccine. These vaccines do not contain any live influenza virus. They are given by injection with a needle, and often called the "flu shot."  A different live, attenuated (weakened) influenza vaccine is sprayed into the nostrils. This vaccine is described in a separate Vaccine Information Statement. Flu vaccination is recommended every year. Some children 6 months through 8 years of age might need two doses during one year. Flu viruses are always changing. Each year's flu vaccine is made  to protect against 3 or 4 viruses that are likely to cause disease that year. Flu vaccine cannot prevent all cases of flu, but it is the best defense against the disease.  It takes about 2 weeks for protection to develop after the vaccination, and protection lasts several months to a year. Some illnesses that are not caused by influenza virus are often mistaken for flu. Flu vaccine will not prevent these illnesses. It can only prevent influenza. Some inactivated flu vaccine contains a very small amount of a mercury-based preservative called thimerosal. Studies have shown that thimerosal in vaccines is not harmful, but flu vaccines that do not contain a preservative are available. 3. Some people should not get this vaccine Tell the person who gives you the vaccine:  If you have any severe, life-threatening allergies. If you ever had a life-threatening allergic reaction after a dose of flu vaccine, or have a severe allergy to any part of this vaccine, including (for example) an allergy to gelatin, antibiotics, or eggs, you may be advised not to get vaccinated. Most, but not all, types of flu vaccine contain a small amount of egg protein.  If you ever had Guillain-Barr Syndrome (a severe paralyzing illness, also called GBS). Some people with a history of GBS should not get this vaccine. This should be discussed with your doctor.  If you are not feeling well. It is usually okay to get flu vaccine when you have a mild illness, but you might be advised to wait until you feel better. You should come back when you are better. 4. Risks of a vaccine reaction With a vaccine, like any medicine, there is a chance of side   effects. These are usually mild and go away on their own. Problems that could happen after any vaccine:  Brief fainting spells can happen after any medical procedure, including vaccination. Sitting or lying down for about 15 minutes can help prevent fainting, and injuries caused by a fall. Tell  your doctor if you feel dizzy, or have vision changes or ringing in the ears.  Severe shoulder pain and reduced range of motion in the arm where a shot was given can happen, very rarely, after a vaccination.  Severe allergic reactions from a vaccine are very rare, estimated at less than 1 in a million doses. If one were to occur, it would usually be within a few minutes to a few hours after the vaccination. Mild problems following inactivated flu vaccine:  soreness, redness, or swelling where the shot was given  hoarseness  sore, red or itchy eyes  cough  fever  aches  headache  itching  fatigue If these problems occur, they usually begin soon after the shot and last 1 or 2 days. Moderate problems following inactivated flu vaccine:  Young children who get inactivated flu vaccine and pneumococcal vaccine (PCV13) at the same time may be at increased risk for seizures caused by fever. Ask your doctor for more information. Tell your doctor if a child who is getting flu vaccine has ever had a seizure. Inactivated flu vaccine does not contain live flu virus, so you cannot get the flu from this vaccine. As with any medicine, there is a very remote chance of a vaccine causing a serious injury or death. The safety of vaccines is always being monitored. For more information, visit: www.cdc.gov/vaccinesafety/ 5. What if there is a serious reaction? What should I look for?  Look for anything that concerns you, such as signs of a severe allergic reaction, very high fever, or behavior changes. Signs of a severe allergic reaction can include hives, swelling of the face and throat, difficulty breathing, a fast heartbeat, dizziness, and weakness. These would start a few minutes to a few hours after the vaccination. What should I do?  If you think it is a severe allergic reaction or other emergency that can't wait, call 9-1-1 and get the person to the nearest hospital. Otherwise, call your  doctor.  Afterward, the reaction should be reported to the Vaccine Adverse Event Reporting System (VAERS). Your doctor should file this report, or you can do it yourself through the VAERS web site at www.vaers.hhs.gov, or by calling 1-800-822-7967. VAERS does not give medical advice. 6. The National Vaccine Injury Compensation Program The National Vaccine Injury Compensation Program (VICP) is a federal program that was created to compensate people who may have been injured by certain vaccines. Persons who believe they may have been injured by a vaccine can learn about the program and about filing a claim by calling 1-800-338-2382 or visiting the VICP website at www.hrsa.gov/vaccinecompensation. There is a time limit to file a claim for compensation. 7. How can I learn more?  Ask your health care provider.  Call your local or state health department.  Contact the Centers for Disease Control and Prevention (CDC):  Call 1-800-232-4636 (1-800-CDC-INFO) or  Visit CDC's website at www.cdc.gov/flu CDC Vaccine Information Statement (Interim) Inactivated Influenza Vaccine (12/29/2012) Document Released: 02/21/2006 Document Revised: 09/13/2013 Document Reviewed: 04/16/2013 [employer] Patient Information 2015 [employer]. This information is not intended to replace advice given to you by your health care provider. Make sure you discuss any questions you have with your health   care provider. Vitamin D Deficiency Vitamin D is an important vitamin that your body needs. Having too little of it in your body is called a deficiency. A very bad deficiency can make your bones soft and can cause a condition called rickets.  Vitamin D is important to your body for different reasons, such as:   It helps your body absorb 2 minerals called calcium and phosphorus.  It helps make your bones healthy.  It may prevent some diseases, such as diabetes and multiple sclerosis.  It helps your muscles and heart. You  can get vitamin D in several ways. It is a natural part of some foods. The vitamin is also added to some dairy products and cereals. Some people take vitamin D supplements. Also, your body makes vitamin D when you are in the sun. It changes the sun's rays into a form of the vitamin that your body can use. CAUSES   Not eating enough foods that contain vitamin D.  Not getting enough sunlight.  Having certain digestive system diseases that make it hard to absorb vitamin D. These diseases include Crohn's disease, chronic pancreatitis, and cystic fibrosis.  Having a surgery in which part of the stomach or small intestine is removed.  Being obese. Fat cells pull vitamin D out of your blood. That means that obese people may not have enough vitamin D left in their blood and in other body tissues.  Having chronic kidney or liver disease. RISK FACTORS Risk factors are things that make you more likely to develop a vitamin D deficiency. They include:  Being older.  Not being able to get outside very much.  Living in a nursing home.  Having had broken bones.  Having weak or thin bones (osteoporosis).  Having a disease or condition that changes how your body absorbs vitamin D.  Having dark skin.  Some medicines such as seizure medicines or steroids.  Being overweight or obese. SYMPTOMS Mild cases of vitamin D deficiency may not have any symptoms. If you have a very bad case, symptoms may include:  Bone pain.  Muscle pain.  Falling often.  Broken bones caused by a minor injury, due to osteoporosis. DIAGNOSIS A blood test is the best way to tell if you have a vitamin D deficiency. TREATMENT Vitamin D deficiency can be treated in different ways. Treatment for vitamin D deficiency depends on what is causing it. Options include:  Taking vitamin D supplements.  Taking a calcium supplement. Your caregiver will suggest what dose is best for you. HOME CARE INSTRUCTIONS  Take any  supplements that your caregiver prescribes. Follow the directions carefully. Take only the suggested amount.  Have your blood tested 2 months after you start taking supplements.  Eat foods that contain vitamin D. Healthy choices include:  Fortified dairy products, cereals, or juices. Fortified means vitamin D has been added to the food. Check the label on the package to be sure.  Fatty fish like salmon or trout.  Eggs.  Oysters.  Do not use a tanning bed.  Keep your weight at a healthy level. Lose weight if you need to.  Keep all follow-up appointments. Your caregiver will need to perform blood tests to make sure your vitamin D deficiency is going away. SEEK MEDICAL CARE IF:  You have any questions about your treatment.  You continue to have symptoms of vitamin D deficiency.  You have nausea or vomiting.  You are constipated.  You feel confused.  You have severe abdominal or  back pain. MAKE SURE YOU:  Understand these instructions.  Will watch your condition.  Will get help right away if you are not doing well or get worse. Document Released: 07/22/2011 Document Revised: 08/24/2012 Document Reviewed: 07/22/2011 [facility] Patient Information 2015 [place], [place]. This information is not intended to replace advice given to you by your health care provider. Make sure you discuss any questions you have with your health care provider. Diabetes and Exercise Exercising regularly is important. It is not just about losing weight. It has many health benefits, such as:  Improving your overall fitness, flexibility, and endurance.  Increasing your bone density.  Helping with weight control.  Decreasing your body fat.  Increasing your muscle strength.  Reducing stress and tension.  Improving your overall health. People with diabetes who exercise gain additional benefits because exercise:  Reduces appetite.  Improves the body's use of blood sugar (glucose).  Helps lower  or control blood glucose.  Decreases blood pressure.  Helps control blood lipids (such as cholesterol and triglycerides).  Improves the body's use of the hormone insulin by:  Increasing the body's insulin sensitivity.  Reducing the body's insulin needs.  Decreases the risk for heart disease because exercising:  Lowers cholesterol and triglycerides levels.  Increases the levels of good cholesterol (such as high-density lipoproteins [HDL]) in the body.  Lowers blood glucose levels. YOUR ACTIVITY PLAN  Choose an activity that you enjoy and set realistic goals. Your health care provider or diabetes educator can help you make an activity plan that works for you. Exercise regularly as directed by your health care provider. This includes:  Performing resistance training twice a week such as push-ups, sit-ups, lifting weights, or using resistance bands.  Performing 150 minutes of cardio exercises each week such as walking, running, or playing sports.  Staying active and spending no more than 90 minutes at one time being inactive. Even short bursts of exercise are good for you. Three 10-minute sessions spread throughout the day are just as beneficial as a single 30-minute session. Some exercise ideas include:  Taking the dog for a walk.  Taking the stairs instead of the elevator.  Dancing to your favorite song.  Doing an exercise video.  Doing your favorite exercise with a friend. RECOMMENDATIONS FOR EXERCISING WITH TYPE 1 OR TYPE 2 DIABETES   Check your blood glucose before exercising. If blood glucose levels are greater than 240 mg/dL, check for urine ketones. Do not exercise if ketones are present.  Avoid injecting insulin into areas of the body that are going to be exercised. For example, avoid injecting insulin into:  The arms when playing tennis.  The legs when jogging.  Keep a record of:  Food intake before and after you exercise.  Expected peak times of insulin  action.  Blood glucose levels before and after you exercise.  The type and amount of exercise you have done.  Review your records with your health care provider. Your health care provider will help you to develop guidelines for adjusting food intake and insulin amounts before and after exercising.  If you take insulin or oral hypoglycemic agents, watch for signs and symptoms of hypoglycemia. They include:  Dizziness.  Shaking.  Sweating.  Chills.  Confusion.  Drink plenty of water while you exercise to prevent dehydration or heat stroke. Body water is lost during exercise and must be replaced.  Talk to your health care provider before starting an exercise program to make sure it is safe for you. Remember,  almost any type of activity is better than none. Document Released: 07/20/2003 Document Revised: 09/13/2013 Document Reviewed: 10/06/2012 [facility] Patient Information 2015 [place], [place]. This information is not intended to replace advice given to you by your health care provider. Make sure you discuss any questions you have with your health care provider.

## 2014-04-19 NOTE — Progress Notes (Signed)
   Subjective:    Patient ID: Jared Norman, male    DOB: 1940-08-11, 73 y.o.   MRN: [id number]  HPI    Review of Systems     Objective:   Physical Exam        Assessment & Plan:

## 2014-04-19 NOTE — Progress Notes (Signed)
   Subjective:    Patient ID: Jared Norman, male    DOB: 03-Jun-1940, 73 y.o.   MRN: [id number]  HPI  73 year old male, diabetes, cholesterol, and vitamin d  recheck. Would like lab work.  Last A1c 08/2013: 6.5 prior reading was 6.0. Family doc in [place] but left town for a while and no one to cover.   Review of Systems  Constitutional: Negative.   Respiratory: Negative.   Cardiovascular: Negative.   Gastrointestinal: Negative.   Endocrine: Negative.   Genitourinary: Negative.   Neurological: Negative.        Objective:   Physical Exam  Constitutional: He is oriented to person, place, and time. He appears well-developed and well-nourished.  HENT:  Head: Normocephalic.  Eyes: Conjunctivae and EOM are normal. Pupils are equal, round, and reactive to light.  Neck: Normal range of motion.  Cardiovascular: Normal rate, regular rhythm and normal heart sounds.   Pulmonary/Chest: Effort normal and breath sounds normal.  Abdominal: Soft. There is no tenderness.  Musculoskeletal: Normal range of motion.  Neurological: He is alert and oriented to person, place, and time. He exhibits normal muscle tone. Coordination normal.  Psychiatric: He has a normal mood and affect.          Assessment & Plan:  Stable controlled T2D, HTN, lipids Will send lab reportrs

## 2014-04-28 ENCOUNTER — Encounter (INDEPENDENT_AMBULATORY_CARE_PROVIDER_SITE_OTHER): Payer: Medicare HMO | Admitting: Ophthalmology

## 2014-04-28 DIAGNOSIS — H43813 Vitreous degeneration, bilateral: Secondary | ICD-10-CM

## 2014-04-28 DIAGNOSIS — E11359 Type 2 diabetes mellitus with proliferative diabetic retinopathy without macular edema: Secondary | ICD-10-CM

## 2014-04-28 DIAGNOSIS — E11311 Type 2 diabetes mellitus with unspecified diabetic retinopathy with macular edema: Secondary | ICD-10-CM

## 2014-04-28 DIAGNOSIS — E11351 Type 2 diabetes mellitus with proliferative diabetic retinopathy with macular edema: Secondary | ICD-10-CM

## 2014-04-28 DIAGNOSIS — I10 Essential (primary) hypertension: Secondary | ICD-10-CM

## 2014-04-28 DIAGNOSIS — H35033 Hypertensive retinopathy, bilateral: Secondary | ICD-10-CM

## 2014-05-02 ENCOUNTER — Ambulatory Visit (INDEPENDENT_AMBULATORY_CARE_PROVIDER_SITE_OTHER): Payer: Medicare HMO | Admitting: Nurse Practitioner

## 2014-05-02 ENCOUNTER — Ambulatory Visit: Payer: Medicare HMO | Admitting: Family Medicine

## 2014-05-02 ENCOUNTER — Encounter: Payer: Self-pay | Admitting: Nurse Practitioner

## 2014-05-02 VITALS — BP 110/66 | HR 75 | Temp 98.3°F | Ht 65.0 in | Wt 165.0 lb

## 2014-05-02 DIAGNOSIS — J069 Acute upper respiratory infection, unspecified: Secondary | ICD-10-CM

## 2014-05-02 MED ORDER — AZITHROMYCIN 250 MG PO TABS: ORAL_TABLET | ORAL | Status: DC

## 2014-05-02 MED ORDER — HYDROCODONE-HOMATROPINE 5-1.5 MG/5ML PO SYRP: 5.0000 mL | ORAL_SOLUTION | Freq: Three times a day (TID) | ORAL | Status: DC | PRN

## 2014-05-02 NOTE — Patient Instructions (Signed)

## 2014-05-02 NOTE — Progress Notes (Signed)
   Subjective:    Patient ID: Jared Norman, male    DOB: 02/06/1941, 73 y.o.   MRN: [id number]  HPI  patiebnt in today c/o cough an dcongestion that started last week. No fever.    Review of Systems  Constitutional: Positive for activity change.  HENT: Positive for congestion and rhinorrhea.   Respiratory: Positive for cough.   Cardiovascular: Negative.   Gastrointestinal: Negative.   Genitourinary: Negative.   Neurological: Negative.   Psychiatric/Behavioral: Negative.   All other systems reviewed and are negative.      Objective:   Physical Exam  Constitutional: He is oriented to person, place, and time. He appears well-developed and well-nourished.  HENT:  Right Ear: External ear normal.  Left Ear: External ear normal.  Nose: Nose normal.  Mouth/Throat: Oropharynx is clear and moist.  Eyes: Pupils are equal, round, and reactive to light.  Neck: Normal range of motion. Neck supple.  Cardiovascular: Normal rate, regular rhythm and normal heart sounds.   Pulmonary/Chest: Effort normal and breath sounds normal.  Abdominal: Soft. Bowel sounds are normal.  Lymphadenopathy:    He has no cervical adenopathy.  Neurological: He is alert and oriented to person, place, and time.  Skin: Skin is warm.  Psychiatric: He has a normal mood and affect. His behavior is normal. Judgment and thought content normal.   BP 110/66 mmHg  Pulse 75  Temp(Src) 98.3 F (36.8 C) (Oral)  Ht 5\' 5"  (1.651 m)  Wt 165 lb (74.844 kg)  BMI 27.46 kg/m2        Assessment & Plan:   1. Upper respiratory infection with cough and congestion    Meds ordered this encounter  Medications  . azithromycin (ZITHROMAX Z-PAK) 250 MG tablet    Sig: As directed    Dispense:  6 each    Refill:  0    Order Specific Question:  Supervising Provider    Answer:  Chipper Herb [[id number]]  . HYDROcodone-homatropine (HYCODAN) 5-1.5 MG/5ML syrup    Sig: Take 5 mLs by mouth every 8 (eight) hours as needed for cough.     Dispense:  120 mL    Refill:  0    Order Specific Question:  Supervising Provider    Answer:  Chipper Herb [[id number]]   1. Take meds as prescribed 2. Use a cool mist humidifier especially during the winter months and when heat has been humid. 3. Use saline nose sprays frequently 4. Saline irrigations of the nose can be very helpful if done frequently.  * 4X daily for 1 week*  * Use of a nettie pot can be helpful with this. Follow directions with this* 5. Drink plenty of fluids 6. Keep thermostat turn down low 7.For any cough or congestion  Use plain Mucinex- regular strength or max strength is fine   * Children- consult with Pharmacist for dosing 8. For fever or aces or pains- take tylenol or ibuprofen appropriate for age and weight.  * for fevers greater than 101 orally you may alternate ibuprofen and tylenol every  3 hours.   Mary-Margaret Hassell Done, FNP

## 2014-05-26 ENCOUNTER — Ambulatory Visit: Payer: Medicare HMO | Admitting: Family

## 2014-05-31 ENCOUNTER — Encounter (INDEPENDENT_AMBULATORY_CARE_PROVIDER_SITE_OTHER): Payer: Medicare HMO | Admitting: Ophthalmology

## 2014-05-31 DIAGNOSIS — I10 Essential (primary) hypertension: Secondary | ICD-10-CM

## 2014-05-31 DIAGNOSIS — H43813 Vitreous degeneration, bilateral: Secondary | ICD-10-CM

## 2014-05-31 DIAGNOSIS — E11351 Type 2 diabetes mellitus with proliferative diabetic retinopathy with macular edema: Secondary | ICD-10-CM

## 2014-05-31 DIAGNOSIS — E11359 Type 2 diabetes mellitus with proliferative diabetic retinopathy without macular edema: Secondary | ICD-10-CM

## 2014-05-31 DIAGNOSIS — E11311 Type 2 diabetes mellitus with unspecified diabetic retinopathy with macular edema: Secondary | ICD-10-CM

## 2014-05-31 DIAGNOSIS — H35033 Hypertensive retinopathy, bilateral: Secondary | ICD-10-CM

## 2014-06-10 ENCOUNTER — Ambulatory Visit: Payer: Medicare HMO | Admitting: Family

## 2014-06-17 ENCOUNTER — Telehealth: Payer: Self-pay | Admitting: Nurse Practitioner

## 2014-06-17 MED ORDER — LISINOPRIL 40 MG PO TABS: 40.0000 mg | ORAL_TABLET | Freq: Every morning | ORAL | Status: DC

## 2014-06-17 NOTE — Telephone Encounter (Signed)
done

## 2014-07-05 ENCOUNTER — Encounter (INDEPENDENT_AMBULATORY_CARE_PROVIDER_SITE_OTHER): Payer: Medicare HMO | Admitting: Ophthalmology

## 2014-07-05 DIAGNOSIS — I10 Essential (primary) hypertension: Secondary | ICD-10-CM

## 2014-07-05 DIAGNOSIS — E11351 Type 2 diabetes mellitus with proliferative diabetic retinopathy with macular edema: Secondary | ICD-10-CM

## 2014-07-05 DIAGNOSIS — E11311 Type 2 diabetes mellitus with unspecified diabetic retinopathy with macular edema: Secondary | ICD-10-CM

## 2014-07-05 DIAGNOSIS — H43813 Vitreous degeneration, bilateral: Secondary | ICD-10-CM

## 2014-07-05 DIAGNOSIS — E11359 Type 2 diabetes mellitus with proliferative diabetic retinopathy without macular edema: Secondary | ICD-10-CM

## 2014-07-05 DIAGNOSIS — H35033 Hypertensive retinopathy, bilateral: Secondary | ICD-10-CM

## 2014-07-05 LAB — HM DIABETES EYE EXAM

## 2014-07-06 ENCOUNTER — Encounter: Payer: Self-pay | Admitting: *Deleted

## 2014-08-02 ENCOUNTER — Encounter (INDEPENDENT_AMBULATORY_CARE_PROVIDER_SITE_OTHER): Payer: Medicare HMO | Admitting: Ophthalmology

## 2014-08-02 DIAGNOSIS — H35033 Hypertensive retinopathy, bilateral: Secondary | ICD-10-CM

## 2014-08-02 DIAGNOSIS — E11359 Type 2 diabetes mellitus with proliferative diabetic retinopathy without macular edema: Secondary | ICD-10-CM

## 2014-08-02 DIAGNOSIS — H43813 Vitreous degeneration, bilateral: Secondary | ICD-10-CM | POA: Diagnosis not present

## 2014-08-02 DIAGNOSIS — I10 Essential (primary) hypertension: Secondary | ICD-10-CM | POA: Diagnosis not present

## 2014-08-02 DIAGNOSIS — E11351 Type 2 diabetes mellitus with proliferative diabetic retinopathy with macular edema: Secondary | ICD-10-CM

## 2014-08-02 DIAGNOSIS — E11311 Type 2 diabetes mellitus with unspecified diabetic retinopathy with macular edema: Secondary | ICD-10-CM

## 2014-08-30 ENCOUNTER — Encounter (INDEPENDENT_AMBULATORY_CARE_PROVIDER_SITE_OTHER): Payer: Medicare HMO | Admitting: Ophthalmology

## 2014-08-30 DIAGNOSIS — H35033 Hypertensive retinopathy, bilateral: Secondary | ICD-10-CM | POA: Diagnosis not present

## 2014-08-30 DIAGNOSIS — I10 Essential (primary) hypertension: Secondary | ICD-10-CM | POA: Diagnosis not present

## 2014-08-30 DIAGNOSIS — E11351 Type 2 diabetes mellitus with proliferative diabetic retinopathy with macular edema: Secondary | ICD-10-CM | POA: Diagnosis not present

## 2014-08-30 DIAGNOSIS — E11359 Type 2 diabetes mellitus with proliferative diabetic retinopathy without macular edema: Secondary | ICD-10-CM

## 2014-08-30 DIAGNOSIS — H43813 Vitreous degeneration, bilateral: Secondary | ICD-10-CM | POA: Diagnosis not present

## 2014-08-30 DIAGNOSIS — E11311 Type 2 diabetes mellitus with unspecified diabetic retinopathy with macular edema: Secondary | ICD-10-CM

## 2014-09-27 ENCOUNTER — Encounter (INDEPENDENT_AMBULATORY_CARE_PROVIDER_SITE_OTHER): Payer: Medicare HMO | Admitting: Ophthalmology

## 2014-09-27 DIAGNOSIS — E11311 Type 2 diabetes mellitus with unspecified diabetic retinopathy with macular edema: Secondary | ICD-10-CM

## 2014-09-27 DIAGNOSIS — H35033 Hypertensive retinopathy, bilateral: Secondary | ICD-10-CM | POA: Diagnosis not present

## 2014-09-27 DIAGNOSIS — H43813 Vitreous degeneration, bilateral: Secondary | ICD-10-CM | POA: Diagnosis not present

## 2014-09-27 DIAGNOSIS — I10 Essential (primary) hypertension: Secondary | ICD-10-CM | POA: Diagnosis not present

## 2014-09-27 DIAGNOSIS — E11359 Type 2 diabetes mellitus with proliferative diabetic retinopathy without macular edema: Secondary | ICD-10-CM

## 2014-09-27 DIAGNOSIS — E11351 Type 2 diabetes mellitus with proliferative diabetic retinopathy with macular edema: Secondary | ICD-10-CM

## 2014-10-25 ENCOUNTER — Encounter (INDEPENDENT_AMBULATORY_CARE_PROVIDER_SITE_OTHER): Payer: Medicare HMO | Admitting: Ophthalmology

## 2014-10-25 DIAGNOSIS — I10 Essential (primary) hypertension: Secondary | ICD-10-CM | POA: Diagnosis not present

## 2014-10-25 DIAGNOSIS — E11311 Type 2 diabetes mellitus with unspecified diabetic retinopathy with macular edema: Secondary | ICD-10-CM

## 2014-10-25 DIAGNOSIS — E11351 Type 2 diabetes mellitus with proliferative diabetic retinopathy with macular edema: Secondary | ICD-10-CM

## 2014-10-25 DIAGNOSIS — H35033 Hypertensive retinopathy, bilateral: Secondary | ICD-10-CM | POA: Diagnosis not present

## 2014-10-25 DIAGNOSIS — H43813 Vitreous degeneration, bilateral: Secondary | ICD-10-CM

## 2014-10-25 DIAGNOSIS — E11341 Type 2 diabetes mellitus with severe nonproliferative diabetic retinopathy with macular edema: Secondary | ICD-10-CM

## 2014-11-07 ENCOUNTER — Other Ambulatory Visit: Payer: Self-pay

## 2014-11-18 ENCOUNTER — Encounter: Payer: Self-pay | Admitting: Family Medicine

## 2014-11-18 ENCOUNTER — Ambulatory Visit (INDEPENDENT_AMBULATORY_CARE_PROVIDER_SITE_OTHER): Payer: Medicare HMO | Admitting: Family Medicine

## 2014-11-18 VITALS — BP 145/75 | HR 64 | Temp 97.2°F | Ht 65.0 in | Wt 174.0 lb

## 2014-11-18 DIAGNOSIS — I10 Essential (primary) hypertension: Secondary | ICD-10-CM

## 2014-11-18 DIAGNOSIS — Z139 Encounter for screening, unspecified: Secondary | ICD-10-CM

## 2014-11-18 DIAGNOSIS — E559 Vitamin D deficiency, unspecified: Secondary | ICD-10-CM

## 2014-11-18 DIAGNOSIS — E785 Hyperlipidemia, unspecified: Secondary | ICD-10-CM

## 2014-11-18 DIAGNOSIS — N400 Enlarged prostate without lower urinary tract symptoms: Secondary | ICD-10-CM | POA: Diagnosis not present

## 2014-11-18 DIAGNOSIS — Z23 Encounter for immunization: Secondary | ICD-10-CM | POA: Diagnosis not present

## 2014-11-18 DIAGNOSIS — E119 Type 2 diabetes mellitus without complications: Secondary | ICD-10-CM | POA: Diagnosis not present

## 2014-11-18 LAB — POCT CBC
Granulocyte percent: 58.7 %G (ref 37–80)
HCT, POC: 42.9 % — AB (ref 43.5–53.7)
Hemoglobin: 14.4 g/dL (ref 14.1–18.1)
Lymph, poc: 2.6 (ref 0.6–3.4)
MCH, POC: 30.8 pg (ref 27–31.2)
MCHC: 33.6 g/dL (ref 31.8–35.4)
MCV: 91.6 fL (ref 80–97)
MPV: 8 fL (ref 0–99.8)
POC Granulocyte: 4.5 (ref 2–6.9)
POC LYMPH PERCENT: 33.7 %L (ref 10–50)
Platelet Count, POC: 221 10*3/uL (ref 142–424)
RBC: 4.68 M/uL — AB (ref 4.69–6.13)
RDW, POC: 13.5 %
WBC: 7.7 10*3/uL (ref 4.6–10.2)

## 2014-11-18 LAB — POCT GLYCOSYLATED HEMOGLOBIN (HGB A1C): Hemoglobin A1C: 6.7

## 2014-11-18 LAB — POCT UA - MICROALBUMIN: Microalbumin Ur, POC: NEGATIVE mg/L

## 2014-11-18 NOTE — Progress Notes (Signed)
Subjective:  Patient ID: Jared Norman, male    DOB: 10/08/1940  Age: 74 y.o. MRN: [id number]  CC: Diabetes; Hyperlipidemia; Hypertension; and Gastrophageal Reflux   HPI Jared Norman presents for  follow-up of hypertension. Patient has no history of headache chest pain or shortness of breath or recent cough. Patient also denies symptoms of TIA such as numbness weakness lateralizing. Patient checks  blood pressure at home and has not had any elevated readings recently. Patient denies side effects from his medication. States taking it regularly.  Patient also  in for follow-up of elevated cholesterol. Doing well without complaints on current medication. Denies side effects of statin including myalgia and arthralgia and nausea. Also in today for liver function testing. Currently no chest pain, shortness of breath or other cardiovascular related symptoms noted.  Follow-up of diabetes. Patient does check blood sugar at home. Readings run between 100 and 150 Patient denies symptoms such as polyuria, polydipsia, excessive hunger, nausea No significant hypoglycemic spells noted. Medications as noted below. Taking them regularly without complication/adverse reaction being reported today.    History Kayd has a past medical history of BPH (benign prostatic hyperplasia); Vitamin D deficiency; Diabetes mellitus without complication; Hyperlipidemia; Hypertension; Kidney stones; and Cataract.   He has past surgical history that includes right foot surgery (Right, 2008); Cystoscopy with retrograde pyelogram, ureteroscopy and stent placement (Left, 12/20/2013); and Eye surgery.   His family history includes Cancer in his father; Hypertension in his brother.He reports that he has never smoked. He does not have any smokeless tobacco history on file. He reports that he does not drink alcohol or use illicit drugs.  Current Outpatient Prescriptions on File Prior to Visit  Medication Sig Dispense Refill  .  amLODipine (NORVASC) 5 MG tablet Take 5 mg by mouth every morning.    Marland Kitchen aspirin EC 81 MG tablet Take 81 mg by mouth every Monday, Wednesday, and Friday.    . cholecalciferol (VITAMIN D) 1000 UNITS tablet Take 2,000 Units by mouth daily.    . finasteride (PROSCAR) 5 MG tablet Take 1 tablet (5 mg total) by mouth daily. 90 tablet 3  . ibuprofen (ADVIL,MOTRIN) 200 MG tablet Take 800 mg by mouth every 6 (six) hours as needed for moderate pain.    Marland Kitchen lisinopril (PRINIVIL,ZESTRIL) 40 MG tablet Take 1 tablet (40 mg total) by mouth every morning. 30 tablet 1  . magnesium gluconate (MAGONATE) 500 MG tablet Take 500 mg by mouth daily.    . metFORMIN (GLUCOPHAGE) 1000 MG tablet Take 1 tablet (1,000 mg total) by mouth daily with breakfast. 90 tablet 3  . simvastatin (ZOCOR) 40 MG tablet Take 20 mg by mouth at bedtime.      No current facility-administered medications on file prior to visit.    ROS Review of Systems  Constitutional: Negative for fever, chills and diaphoresis.  HENT: Negative for congestion, rhinorrhea and sore throat.   Respiratory: Negative for cough, shortness of breath and wheezing.   Cardiovascular: Negative for chest pain.  Gastrointestinal: Negative for nausea, vomiting, abdominal pain, diarrhea, constipation and abdominal distention.  Genitourinary: Negative for dysuria and frequency.  Musculoskeletal: Negative for joint swelling and arthralgias.  Skin: Negative for rash.  Neurological: Negative for headaches.    Objective:  BP 145/75 mmHg  Pulse 64  Temp(Src) 97.2 F (36.2 C) (Oral)  Ht 5' 5" (1.651 m)  Wt 174 lb (78.926 kg)  BMI 28.96 kg/m2  BP Readings from Last 3 Encounters:  11/18/14 145/75  05/02/14 110/66  04/19/14 128/74    Wt Readings from Last 3 Encounters:  11/18/14 174 lb (78.926 kg)  05/02/14 165 lb (74.844 kg)  04/19/14 164 lb (74.39 kg)     Physical Exam  Constitutional: He is oriented to person, place, and time. He appears well-developed and  well-nourished. No distress.  HENT:  Head: Normocephalic and atraumatic.  Right Ear: External ear normal.  Left Ear: External ear normal.  Nose: Nose normal.  Mouth/Throat: Oropharynx is clear and moist.  Eyes: Conjunctivae and EOM are normal. Pupils are equal, round, and reactive to light.  Neck: Normal range of motion. Neck supple. No thyromegaly present.  Cardiovascular: Normal rate, regular rhythm and normal heart sounds.   No murmur heard. Pulmonary/Chest: Effort normal and breath sounds normal. No respiratory distress. He has no wheezes. He has no rales.  Abdominal: Soft. Bowel sounds are normal. He exhibits no distension. There is no tenderness.  Lymphadenopathy:    He has no cervical adenopathy.  Neurological: He is alert and oriented to person, place, and time. He has normal reflexes.  Skin: Skin is warm and dry.  Psychiatric: He has a normal mood and affect. His behavior is normal. Judgment and thought content normal.    Lab Results  Component Value Date   HGBA1C 6.7 11/18/2014   HGBA1C 6.3 04/19/2014   HGBA1C 6.0 09/07/2013    Lab Results  Component Value Date   WBC 7.7 11/18/2014   HGB 14.4 11/18/2014   HCT 42.9* 11/18/2014   PLT 217 12/21/2013   GLUCOSE 101* 04/19/2014   CHOL 138 04/19/2014   TRIG 75 04/19/2014   HDL 40 04/19/2014   LDLCALC 83 04/19/2014   ALT 20 04/19/2014   AST 20 04/19/2014   NA 138 04/19/2014   K 5.1 04/19/2014   CL 102 04/19/2014   CREATININE 1.06 04/19/2014   BUN 18 04/19/2014   CO2 27 04/19/2014   HGBA1C 6.7 11/18/2014    Dg Abd 1 View  12/27/2013   CLINICAL DATA:  Left ureteral stent.  EXAM: ABDOMEN - 1 VIEW  COMPARISON:  CT 12/20/2013.  FINDINGS: Soft tissue structures are unremarkable. 10 mm calcified stone in the left ureteropelvic junctions again noted. Left ureteral double-J stent noted in good anatomic position. Degenerative changes lumbar spine and both hips.  IMPRESSION: 1. Stable position of 10 mm stone left  ureteropelvic junction. 2. Double-J left ureteral stent in good anatomic position.   Electronically Signed   By: Marcello Moores  Register   On: 12/27/2013 10:19    Assessment & Plan:   Jenny was seen today for diabetes, hyperlipidemia, hypertension and gastrophageal reflux.  Diagnoses and all orders for this visit:  BPH (benign prostatic hyperplasia) Orders: -     PSA, total and free  Vitamin D deficiency Orders: -     POCT CBC; Standing -     Vit D  25 hydroxy (rtn osteoporosis monitoring); Standing -     POCT CBC -     Vit D  25 hydroxy (rtn osteoporosis monitoring)  Diabetes mellitus without complication Orders: -     POCT glycosylated hemoglobin (Hb A1C); Standing -     POCT UA - Microalbumin -     POCT glycosylated hemoglobin (Hb A1C)  Hyperlipidemia Orders: -     CMP14+EGFR; Standing -     Lipid panel; Standing -     CMP14+EGFR -     Lipid panel  Essential hypertension Orders: -     POCT CBC;  Standing -     CMP14+EGFR; Standing -     POCT CBC -     CMP14+EGFR  Screening Orders: -     PSA, total and free -     Thyroid Panel With TSH -     Ambulatory referral to Gastroenterology  Other orders -     Pneumococcal conjugate vaccine 13-valent   I have discontinued Mr. Calvin azithromycin and HYDROcodone-homatropine. I am also having him maintain his magnesium gluconate, finasteride, metFORMIN, simvastatin, ibuprofen, cholecalciferol, aspirin EC, amLODipine, and lisinopril.  No orders of the defined types were placed in this encounter.     Follow-up: Return in about 3 months (around 02/18/2015) for diabetes, hypertension.  Claretta Fraise, M.D.

## 2014-11-18 NOTE — Patient Instructions (Signed)
DASH Eating Plan °DASH stands for "Dietary Approaches to Stop Hypertension." The DASH eating plan is a healthy eating plan that has been shown to reduce high blood pressure (hypertension). Additional health benefits may include reducing the risk of type 2 diabetes mellitus, heart disease, and stroke. The DASH eating plan may also help with weight loss. °WHAT DO I NEED TO KNOW ABOUT THE DASH EATING PLAN? °For the DASH eating plan, you will follow these general guidelines: °· Choose foods with a percent daily value for sodium of less than 5% (as listed on the food label). °· Use salt-free seasonings or herbs instead of table salt or sea salt. °· Check with your health care provider or pharmacist before using salt substitutes. °· Eat lower-sodium products, often labeled as "lower sodium" or "no salt added." °· Eat fresh foods. °· Eat more vegetables, fruits, and low-fat dairy products. °· Choose whole grains. Look for the word "whole" as the first word in the ingredient list. °· Choose fish and skinless chicken or turkey more often than red meat. Limit fish, poultry, and meat to 6 oz (170 g) each day. °· Limit sweets, desserts, sugars, and sugary drinks. °· Choose heart-healthy fats. °· Limit cheese to 1 oz (28 g) per day. °· Eat more home-cooked food and less restaurant, buffet, and fast food. °· Limit fried foods. °· Cook foods using methods other than frying. °· Limit canned vegetables. If you do use them, rinse them well to decrease the sodium. °· When eating at a restaurant, ask that your food be prepared with less salt, or no salt if possible. °WHAT FOODS CAN I EAT? °Seek help from a dietitian for individual calorie needs. °Grains °Whole grain or whole wheat bread. Brown rice. Whole grain or whole wheat pasta. Quinoa, bulgur, and whole grain cereals. Low-sodium cereals. Corn or whole wheat flour tortillas. Whole grain cornbread. Whole grain crackers. Low-sodium crackers. °Vegetables °Fresh or frozen vegetables  (raw, steamed, roasted, or grilled). Low-sodium or reduced-sodium tomato and vegetable juices. Low-sodium or reduced-sodium tomato sauce and paste. Low-sodium or reduced-sodium canned vegetables.  °Fruits °All fresh, canned (in natural juice), or frozen fruits. °Meat and Other Protein Products °Ground beef (85% or leaner), grass-fed beef, or beef trimmed of fat. Skinless chicken or turkey. Ground chicken or turkey. Pork trimmed of fat. All fish and seafood. Eggs. Dried beans, peas, or lentils. Unsalted nuts and seeds. Unsalted canned beans. °Dairy °Low-fat dairy products, such as skim or 1% milk, 2% or reduced-fat cheeses, low-fat ricotta or cottage cheese, or plain low-fat yogurt. Low-sodium or reduced-sodium cheeses. °Fats and Oils °Tub margarines without trans fats. Light or reduced-fat mayonnaise and salad dressings (reduced sodium). Avocado. Safflower, olive, or canola oils. Natural peanut or almond butter. °Other °Unsalted popcorn and pretzels. °The items listed above may not be a complete list of recommended foods or beverages. Contact your dietitian for more options. °WHAT FOODS ARE NOT RECOMMENDED? °Grains °White bread. White pasta. White rice. Refined cornbread. Bagels and croissants. Crackers that contain trans fat. °Vegetables °Creamed or fried vegetables. Vegetables in a cheese sauce. Regular canned vegetables. Regular canned tomato sauce and paste. Regular tomato and vegetable juices. °Fruits °Dried fruits. Canned fruit in light or heavy syrup. Fruit juice. °Meat and Other Protein Products °Fatty cuts of meat. Ribs, chicken wings, bacon, sausage, bologna, salami, chitterlings, fatback, hot dogs, bratwurst, and packaged luncheon meats. Salted nuts and seeds. Canned beans with salt. °Dairy °Whole or 2% milk, cream, half-and-half, and cream cheese. Whole-fat or sweetened yogurt. Full-fat   cheeses or blue cheese. Nondairy creamers and whipped toppings. Processed cheese, cheese spreads, or cheese  curds. °Condiments °Onion and garlic salt, seasoned salt, table salt, and sea salt. Canned and packaged gravies. Worcestershire sauce. Tartar sauce. Barbecue sauce. Teriyaki sauce. Soy sauce, including reduced sodium. Steak sauce. Fish sauce. Oyster sauce. Cocktail sauce. Horseradish. Ketchup and mustard. Meat flavorings and tenderizers. Bouillon cubes. Hot sauce. Tabasco sauce. Marinades. Taco seasonings. Relishes. °Fats and Oils °Butter, stick margarine, lard, shortening, ghee, and bacon fat. Coconut, palm kernel, or palm oils. Regular salad dressings. °Other °Pickles and olives. Salted popcorn and pretzels. °The items listed above may not be a complete list of foods and beverages to avoid. Contact your dietitian for more information. °WHERE CAN I FIND MORE INFORMATION? °[employer], [employer], [employer]: www.nhlbi.nih.gov/health/health-topics/topics/dash/ °Document Released: 04/18/2011 Document Revised: 09/13/2013 Document Reviewed: 03/03/2013 °[employer]® Patient Information ©2015 [employer]. This information is not intended to replace advice given to you by your health care provider. Make sure you discuss any questions you have with your health care provider. ° °

## 2014-11-19 LAB — CMP14+EGFR
ALT: 34 IU/L (ref 0–44)
AST: 23 IU/L (ref 0–40)
Albumin/Globulin Ratio: 2 (ref 1.1–2.5)
Albumin: 4.3 g/dL (ref 3.5–4.8)
Alkaline Phosphatase: 67 IU/L (ref 39–117)
BUN/Creatinine Ratio: 11 (ref 10–22)
BUN: 14 mg/dL (ref 8–27)
Bilirubin Total: 1.1 mg/dL (ref 0.0–1.2)
CO2: 23 mmol/L (ref 18–29)
Calcium: 9.6 mg/dL (ref 8.6–10.2)
Chloride: 99 mmol/L (ref 97–108)
Creatinine, Ser: 1.25 mg/dL (ref 0.76–1.27)
GFR calc Af Amer: 65 mL/min/{1.73_m2} (ref >59)
GFR calc non Af Amer: 56 mL/min/{1.73_m2} — ABNORMAL LOW (ref >59)
Globulin, Total: 2.2 g/dL (ref 1.5–4.5)
Glucose: 123 mg/dL — ABNORMAL HIGH (ref 65–99)
Potassium: 5.3 mmol/L — ABNORMAL HIGH (ref 3.5–5.2)
Sodium: 138 mmol/L (ref 134–144)
Total Protein: 6.5 g/dL (ref 6.0–8.5)

## 2014-11-19 LAB — PSA, TOTAL AND FREE
PSA, Free Pct: 17.1 %
PSA, Free: 0.53 ng/mL
Prostate Specific Ag, Serum: 3.1 ng/mL (ref 0.0–4.0)

## 2014-11-19 LAB — LIPID PANEL
Chol/HDL Ratio: 2.7 ratio units (ref 0.0–5.0)
Cholesterol, Total: 135 mg/dL (ref 100–199)
HDL: 50 mg/dL (ref >39)
LDL Calculated: 69 mg/dL (ref 0–99)
Triglycerides: 78 mg/dL (ref 0–149)
VLDL Cholesterol Cal: 16 mg/dL (ref 5–40)

## 2014-11-19 LAB — THYROID PANEL WITH TSH
Free Thyroxine Index: 2.1 (ref 1.2–4.9)
T3 Uptake Ratio: 27 % (ref 24–39)
T4, Total: 7.8 ug/dL (ref 4.5–12.0)
TSH: 2.19 u[IU]/mL (ref 0.450–4.500)

## 2014-11-19 LAB — VITAMIN D 25 HYDROXY (VIT D DEFICIENCY, FRACTURES): Vit D, 25-Hydroxy: 50.7 ng/mL (ref 30.0–100.0)

## 2014-11-22 ENCOUNTER — Encounter: Payer: Self-pay | Admitting: *Deleted

## 2014-11-24 ENCOUNTER — Other Ambulatory Visit: Payer: Self-pay | Admitting: Family

## 2014-11-25 ENCOUNTER — Telehealth: Payer: Self-pay | Admitting: Family Medicine

## 2014-11-25 ENCOUNTER — Encounter (INDEPENDENT_AMBULATORY_CARE_PROVIDER_SITE_OTHER): Payer: Medicare HMO | Admitting: Ophthalmology

## 2014-11-25 DIAGNOSIS — E11351 Type 2 diabetes mellitus with proliferative diabetic retinopathy with macular edema: Secondary | ICD-10-CM

## 2014-11-25 DIAGNOSIS — H43813 Vitreous degeneration, bilateral: Secondary | ICD-10-CM

## 2014-11-25 DIAGNOSIS — H35033 Hypertensive retinopathy, bilateral: Secondary | ICD-10-CM | POA: Diagnosis not present

## 2014-11-25 DIAGNOSIS — E11359 Type 2 diabetes mellitus with proliferative diabetic retinopathy without macular edema: Secondary | ICD-10-CM

## 2014-11-25 DIAGNOSIS — E11311 Type 2 diabetes mellitus with unspecified diabetic retinopathy with macular edema: Secondary | ICD-10-CM | POA: Diagnosis not present

## 2014-11-25 DIAGNOSIS — I10 Essential (primary) hypertension: Secondary | ICD-10-CM

## 2014-11-28 MED ORDER — OMEPRAZOLE 40 MG PO CPDR: 40.0000 mg | DELAYED_RELEASE_CAPSULE | Freq: Every day | ORAL | Status: DC

## 2014-11-28 NOTE — Telephone Encounter (Signed)
Scrip sent 

## 2015-01-04 ENCOUNTER — Other Ambulatory Visit: Payer: Self-pay | Admitting: Family

## 2015-01-20 ENCOUNTER — Encounter (INDEPENDENT_AMBULATORY_CARE_PROVIDER_SITE_OTHER): Payer: Medicare HMO | Admitting: Ophthalmology

## 2015-01-30 ENCOUNTER — Encounter (INDEPENDENT_AMBULATORY_CARE_PROVIDER_SITE_OTHER): Payer: Medicare HMO | Admitting: Ophthalmology

## 2015-01-30 DIAGNOSIS — E11351 Type 2 diabetes mellitus with proliferative diabetic retinopathy with macular edema: Secondary | ICD-10-CM

## 2015-01-30 DIAGNOSIS — E11359 Type 2 diabetes mellitus with proliferative diabetic retinopathy without macular edema: Secondary | ICD-10-CM | POA: Diagnosis not present

## 2015-01-30 DIAGNOSIS — I10 Essential (primary) hypertension: Secondary | ICD-10-CM

## 2015-01-30 DIAGNOSIS — H35033 Hypertensive retinopathy, bilateral: Secondary | ICD-10-CM

## 2015-01-30 DIAGNOSIS — H43813 Vitreous degeneration, bilateral: Secondary | ICD-10-CM

## 2015-01-30 DIAGNOSIS — E11311 Type 2 diabetes mellitus with unspecified diabetic retinopathy with macular edema: Secondary | ICD-10-CM

## 2015-02-02 ENCOUNTER — Other Ambulatory Visit: Payer: Self-pay | Admitting: Family

## 2015-02-07 ENCOUNTER — Other Ambulatory Visit: Payer: Self-pay | Admitting: Family Medicine

## 2015-02-20 ENCOUNTER — Encounter: Payer: Self-pay | Admitting: Family Medicine

## 2015-02-20 ENCOUNTER — Ambulatory Visit (INDEPENDENT_AMBULATORY_CARE_PROVIDER_SITE_OTHER): Payer: Medicare HMO | Admitting: Family Medicine

## 2015-02-20 VITALS — BP 144/74 | HR 70 | Temp 97.0°F | Ht 65.0 in | Wt 179.0 lb

## 2015-02-20 DIAGNOSIS — R799 Abnormal finding of blood chemistry, unspecified: Secondary | ICD-10-CM

## 2015-02-20 DIAGNOSIS — E785 Hyperlipidemia, unspecified: Secondary | ICD-10-CM | POA: Diagnosis not present

## 2015-02-20 DIAGNOSIS — Z23 Encounter for immunization: Secondary | ICD-10-CM

## 2015-02-20 DIAGNOSIS — I10 Essential (primary) hypertension: Secondary | ICD-10-CM

## 2015-02-20 DIAGNOSIS — E559 Vitamin D deficiency, unspecified: Secondary | ICD-10-CM

## 2015-02-20 DIAGNOSIS — E119 Type 2 diabetes mellitus without complications: Secondary | ICD-10-CM

## 2015-02-20 LAB — POCT GLYCOSYLATED HEMOGLOBIN (HGB A1C): Hemoglobin A1C: 6.5

## 2015-02-20 NOTE — Patient Instructions (Signed)
Diabetes and Foot Care Diabetes may cause you to have problems because of poor blood supply (circulation) to your feet and legs. This may cause the skin on your feet to become thinner, break easier, and heal more slowly. Your skin may become dry, and the skin may peel and crack. You may also have nerve damage in your legs and feet causing decreased feeling in them. You may not notice minor injuries to your feet that could lead to infections or more serious problems. Taking care of your feet is one of the most important things you can do for yourself.  HOME CARE INSTRUCTIONS  Wear shoes at all times, even in the house. Do not go barefoot. Bare feet are easily injured.  Check your feet daily for blisters, cuts, and redness. If you cannot see the bottom of your feet, use a mirror or ask someone for help.  Wash your feet with warm water (do not use hot water) and mild soap. Then pat your feet and the areas between your toes until they are completely dry. Do not soak your feet as this can dry your skin.  Apply a moisturizing lotion or petroleum jelly (that does not contain alcohol and is unscented) to the skin on your feet and to dry, brittle toenails. Do not apply lotion between your toes.  Trim your toenails straight across. Do not dig under them or around the cuticle. File the edges of your nails with an emery board or nail file.  Do not cut corns or calluses or try to remove them with medicine.  Wear clean socks or stockings every day. Make sure they are not too tight. Do not wear knee-high stockings since they may decrease blood flow to your legs.  Wear shoes that fit properly and have enough cushioning. To break in new shoes, wear them for just a few hours a day. This prevents you from injuring your feet. Always look in your shoes before you put them on to be sure there are no objects inside.  Do not cross your legs. This may decrease the blood flow to your feet.  If you find a minor scrape,  cut, or break in the skin on your feet, keep it and the skin around it clean and dry. These areas may be cleansed with mild soap and water. Do not cleanse the area with peroxide, alcohol, or iodine.  When you remove an adhesive bandage, be sure not to damage the skin around it.  If you have a wound, look at it several times a day to make sure it is healing.  Do not use heating pads or hot water bottles. They may burn your skin. If you have lost feeling in your feet or legs, you may not know it is happening until it is too late.  Make sure your health care provider performs a complete foot exam at least annually or more often if you have foot problems. Report any cuts, sores, or bruises to your health care provider immediately. SEEK MEDICAL CARE IF:   You have an injury that is not healing.  You have cuts or breaks in the skin.  You have an ingrown nail.  You notice redness on your legs or feet.  You feel burning or tingling in your legs or feet.  You have pain or cramps in your legs and feet.  Your legs or feet are numb.  Your feet always feel cold. SEEK IMMEDIATE MEDICAL CARE IF:   There is increasing redness,   swelling, or pain in or around a wound.  There is a red line that goes up your leg.  Pus is coming from a wound.  You develop a fever or as directed by your health care provider.  You notice a bad smell coming from an ulcer or wound.   This information is not intended to replace advice given to you by your health care provider. Make sure you discuss any questions you have with your health care provider.   Document Released: 04/26/2000 Document Revised: 12/30/2012 Document Reviewed: 10/06/2012 [employer] Interactive Patient Education 2016 [employer]  

## 2015-02-20 NOTE — Progress Notes (Signed)
Subjective:  Patient ID: Jared Norman, male    DOB: 1941/04/23  Age: 74 y.o. MRN: [id number]  CC: Hypertension; Hyperlipidemia; and Diabetes   HPI Jared Norman presents forFollow-up of diabetes. Got started on a program, but got side tracked.  Patient does not check blood sugar at home Patient denies symptoms such as polyuria, polydipsia, excessive hunger, nausea No significant hypoglycemic spells noted. Medications as noted below. Taking them regularly without complication/adverse reaction being reported today.   Patient in for follow-up of elevated cholesterol. Doing well without complaints on current medication. Denies side effects of statin including myalgia and arthralgia and nausea. Also in today for liver function testing. Currently no chest pain, shortness of breath or other cardiovascular related symptoms noted.   follow-up of hypertension. Patient has no history of headache chest pain or shortness of breath or recent cough. Patient also denies symptoms of TIA such as numbness weakness lateralizing. Patient checks  blood pressure at home and has not had any elevated readings recently. Patient denies side effects from his medication. States taking it regularly.  Also Vit. D has been low. Off of supplement. Due for recheck of level.   History Jared Norman has a past medical history of BPH (benign prostatic hyperplasia); Vitamin D deficiency; Diabetes mellitus without complication ([place]); Hyperlipidemia; Hypertension; Kidney stones; and Cataract.   He has past surgical history that includes right foot surgery (Right, 2008); Cystoscopy with retrograde pyelogram, ureteroscopy and stent placement (Left, 12/20/2013); and Eye surgery.   His family history includes Cancer in his father; Hypertension in his brother.He reports that he has never smoked. He does not have any smokeless tobacco history on file. He reports that he does not drink alcohol or use illicit drugs.  Current Outpatient  Prescriptions on File Prior to Visit  Medication Sig Dispense Refill  . amLODipine (NORVASC) 5 MG tablet TAKE ONE TABLET BY MOUTH ONCE DAILY 90 tablet 1  . aspirin EC 81 MG tablet Take 81 mg by mouth every Monday, Wednesday, and Friday.    . cholecalciferol (VITAMIN D) 1000 UNITS tablet Take 2,000 Units by mouth daily.    . finasteride (PROSCAR) 5 MG tablet TAKE ONE TABLET BY MOUTH ONCE DAILY 90 tablet 1  . ibuprofen (ADVIL,MOTRIN) 200 MG tablet Take 800 mg by mouth every 6 (six) hours as needed for moderate pain.    Marland Kitchen lisinopril (PRINIVIL,ZESTRIL) 40 MG tablet TAKE ONE TABLET BY MOUTH ONCE DAILY 90 tablet 0  . magnesium gluconate (MAGONATE) 500 MG tablet Take 500 mg by mouth daily.    . metFORMIN (GLUCOPHAGE) 1000 MG tablet TAKE ONE TABLET BY MOUTH ONCE DAILY WITH BREAKFAST 90 tablet 0  . omeprazole (PRILOSEC) 40 MG capsule Take 1 capsule (40 mg total) by mouth daily. 90 capsule 3  . simvastatin (ZOCOR) 40 MG tablet Take 20 mg by mouth at bedtime.      No current facility-administered medications on file prior to visit.    ROS Review of Systems  Constitutional: Negative for fever, chills and diaphoresis.  HENT: Negative for congestion, rhinorrhea and sore throat.   Respiratory: Negative for cough, shortness of breath and wheezing.   Cardiovascular: Negative for chest pain.  Gastrointestinal: Negative for nausea, vomiting, abdominal pain, diarrhea, constipation and abdominal distention.  Genitourinary: Negative for dysuria and frequency.  Musculoskeletal: Negative for joint swelling and arthralgias.  Skin: Negative for rash.  Neurological: Negative for headaches.    Objective:  BP 144/74 mmHg  Pulse 70  Temp(Src) 97 F (36.1  C) (Oral)  Ht '5\' 5"'  (1.651 m)  Wt 179 lb (81.194 kg)  BMI 29.79 kg/m2  BP Readings from Last 3 Encounters:  02/20/15 144/74  11/18/14 145/75  05/02/14 110/66    Wt Readings from Last 3 Encounters:  02/20/15 179 lb (81.194 kg)  11/18/14 174 lb (78.926  kg)  05/02/14 165 lb (74.844 kg)     Physical Exam  Constitutional: He is oriented to person, place, and time. He appears well-developed and well-nourished. No distress.  HENT:  Head: Normocephalic and atraumatic.  Right Ear: External ear normal.  Left Ear: External ear normal.  Nose: Nose normal.  Mouth/Throat: Oropharynx is clear and moist.  Eyes: Conjunctivae and EOM are normal. Pupils are equal, round, and reactive to light.  Neck: Normal range of motion. Neck supple. No thyromegaly present.  Cardiovascular: Normal rate, regular rhythm and normal heart sounds.   No murmur heard. Pulmonary/Chest: Effort normal and breath sounds normal. No respiratory distress. He has no wheezes. He has no rales.  Abdominal: Soft. Bowel sounds are normal. He exhibits no distension. There is no tenderness.  Lymphadenopathy:    He has no cervical adenopathy.  Neurological: He is alert and oriented to person, place, and time. He has normal reflexes.  Skin: Skin is warm and dry.  Psychiatric: He has a normal mood and affect. His behavior is normal. Judgment and thought content normal.    Lab Results  Component Value Date   HGBA1C 6.5 02/20/2015   HGBA1C 6.7 11/18/2014   HGBA1C 6.3 04/19/2014    Lab Results  Component Value Date   WBC 8.8 02/20/2015   HGB 14.4 11/18/2014   HCT 43.3 02/20/2015   PLT 217 12/21/2013   GLUCOSE 136* 02/20/2015   CHOL 152 02/20/2015   TRIG 190* 02/20/2015   HDL 42 02/20/2015   LDLCALC 72 02/20/2015   ALT 29 02/20/2015   AST 22 02/20/2015   NA 138 02/20/2015   K 5.5* 02/20/2015   CL 98 02/20/2015   CREATININE 1.18 02/20/2015   BUN 21 02/20/2015   CO2 24 02/20/2015   TSH 2.190 11/18/2014   PSA 3.1 11/18/2014   HGBA1C 6.5 02/20/2015     Assessment & Plan:   Jared Norman was seen today for hypertension, hyperlipidemia and diabetes.  Diagnoses and all orders for this visit:  Encounter for immunization  Vitamin D deficiency -     Cancel: POCT CBC -      Vit D  25 hydroxy (rtn osteoporosis monitoring) -     Cancel: CBC with Differential/Platelet  Essential hypertension -     Cancel: POCT CBC -     CMP14+EGFR -     Cancel: CBC with Differential/Platelet  Diabetes mellitus without complication (HCC) -     POCT glycosylated hemoglobin (Hb A1C) -     Cancel: CBC with Differential/Platelet  Hyperlipidemia -     CMP14+EGFR -     Lipid panel -     Cancel: CBC with Differential/Platelet  Abnormal blood chemistry level -     CMP14+EGFR; Future  Other orders -     Flu Vaccine QUAD 36+ mos IM -     CBC with Differential/Platelet   I am having Jared Norman maintain his magnesium gluconate, simvastatin, ibuprofen, cholecalciferol, aspirin EC, amLODipine, omeprazole, finasteride, lisinopril, and metFORMIN.  No orders of the defined types were placed in this encounter.    Diabetes foot care reviewed. Handout given,  Follow-up: Return in about 3 months (around 05/23/2015) for  diabetes.  Claretta Fraise, M.D.

## 2015-02-21 LAB — CMP14+EGFR
ALT: 29 IU/L (ref 0–44)
AST: 22 IU/L (ref 0–40)
Albumin/Globulin Ratio: 1.7 (ref 1.1–2.5)
Albumin: 4.3 g/dL (ref 3.5–4.8)
Alkaline Phosphatase: 74 IU/L (ref 39–117)
BUN/Creatinine Ratio: 18 (ref 10–22)
BUN: 21 mg/dL (ref 8–27)
Bilirubin Total: 0.9 mg/dL (ref 0.0–1.2)
CO2: 24 mmol/L (ref 18–29)
Calcium: 9.5 mg/dL (ref 8.6–10.2)
Chloride: 98 mmol/L (ref 97–108)
Creatinine, Ser: 1.18 mg/dL (ref 0.76–1.27)
GFR calc Af Amer: 69 mL/min/{1.73_m2} (ref >59)
GFR calc non Af Amer: 60 mL/min/{1.73_m2} (ref >59)
Globulin, Total: 2.6 g/dL (ref 1.5–4.5)
Glucose: 136 mg/dL — ABNORMAL HIGH (ref 65–99)
Potassium: 5.5 mmol/L — ABNORMAL HIGH (ref 3.5–5.2)
Sodium: 138 mmol/L (ref 134–144)
Total Protein: 6.9 g/dL (ref 6.0–8.5)

## 2015-02-21 LAB — CBC WITH DIFFERENTIAL/PLATELET
Basophils Absolute: 0.1 10*3/uL (ref 0.0–0.2)
Basos: 1 %
EOS (ABSOLUTE): 0.2 10*3/uL (ref 0.0–0.4)
Eos: 2 %
Hematocrit: 43.3 % (ref 37.5–51.0)
Hemoglobin: 14.8 g/dL (ref 12.6–17.7)
Immature Grans (Abs): 0 10*3/uL (ref 0.0–0.1)
Immature Granulocytes: 0 %
Lymphocytes Absolute: 2.4 10*3/uL (ref 0.7–3.1)
Lymphs: 28 %
MCH: 31.4 pg (ref 26.6–33.0)
MCHC: 34.2 g/dL (ref 31.5–35.7)
MCV: 92 fL (ref 79–97)
Monocytes Absolute: 0.8 10*3/uL (ref 0.1–0.9)
Monocytes: 9 %
Neutrophils Absolute: 5.3 10*3/uL (ref 1.4–7.0)
Neutrophils: 60 %
Platelets: 221 10*3/uL (ref 150–379)
RBC: 4.71 x10E6/uL (ref 4.14–5.80)
RDW: 13.5 % (ref 12.3–15.4)
WBC: 8.8 10*3/uL (ref 3.4–10.8)

## 2015-02-21 LAB — LIPID PANEL
Chol/HDL Ratio: 3.6 ratio units (ref 0.0–5.0)
Cholesterol, Total: 152 mg/dL (ref 100–199)
HDL: 42 mg/dL (ref >39)
LDL Calculated: 72 mg/dL (ref 0–99)
Triglycerides: 190 mg/dL — ABNORMAL HIGH (ref 0–149)
VLDL Cholesterol Cal: 38 mg/dL (ref 5–40)

## 2015-02-21 LAB — VITAMIN D 25 HYDROXY (VIT D DEFICIENCY, FRACTURES): Vit D, 25-Hydroxy: 38.1 ng/mL (ref 30.0–100.0)

## 2015-02-22 ENCOUNTER — Other Ambulatory Visit: Payer: Self-pay | Admitting: Family

## 2015-02-27 ENCOUNTER — Other Ambulatory Visit (INDEPENDENT_AMBULATORY_CARE_PROVIDER_SITE_OTHER): Payer: Medicare HMO

## 2015-02-27 DIAGNOSIS — R799 Abnormal finding of blood chemistry, unspecified: Secondary | ICD-10-CM

## 2015-02-27 NOTE — Progress Notes (Signed)
Lab only 

## 2015-02-28 LAB — CMP14+EGFR
ALT: 32 IU/L (ref 0–44)
AST: 25 IU/L (ref 0–40)
Albumin/Globulin Ratio: 1.8 (ref 1.1–2.5)
Albumin: 4.4 g/dL (ref 3.5–4.8)
Alkaline Phosphatase: 61 IU/L (ref 39–117)
BUN/Creatinine Ratio: 15 (ref 10–22)
BUN: 19 mg/dL (ref 8–27)
Bilirubin Total: 1.1 mg/dL (ref 0.0–1.2)
CO2: 26 mmol/L (ref 18–29)
Calcium: 9.6 mg/dL (ref 8.6–10.2)
Chloride: 99 mmol/L (ref 97–106)
Creatinine, Ser: 1.25 mg/dL (ref 0.76–1.27)
GFR calc Af Amer: 65 mL/min/{1.73_m2} (ref >59)
GFR calc non Af Amer: 56 mL/min/{1.73_m2} — ABNORMAL LOW (ref >59)
Globulin, Total: 2.4 g/dL (ref 1.5–4.5)
Glucose: 105 mg/dL — ABNORMAL HIGH (ref 65–99)
Potassium: 5 mmol/L (ref 3.5–5.2)
Sodium: 139 mmol/L (ref 136–144)
Total Protein: 6.8 g/dL (ref 6.0–8.5)

## 2015-03-27 ENCOUNTER — Encounter (INDEPENDENT_AMBULATORY_CARE_PROVIDER_SITE_OTHER): Payer: Medicare HMO | Admitting: Ophthalmology

## 2015-03-27 DIAGNOSIS — H43813 Vitreous degeneration, bilateral: Secondary | ICD-10-CM | POA: Diagnosis not present

## 2015-03-27 DIAGNOSIS — H35033 Hypertensive retinopathy, bilateral: Secondary | ICD-10-CM | POA: Diagnosis not present

## 2015-03-27 DIAGNOSIS — I10 Essential (primary) hypertension: Secondary | ICD-10-CM

## 2015-03-27 DIAGNOSIS — E11311 Type 2 diabetes mellitus with unspecified diabetic retinopathy with macular edema: Secondary | ICD-10-CM | POA: Diagnosis not present

## 2015-03-27 DIAGNOSIS — E113513 Type 2 diabetes mellitus with proliferative diabetic retinopathy with macular edema, bilateral: Secondary | ICD-10-CM

## 2015-03-28 ENCOUNTER — Telehealth: Payer: Self-pay | Admitting: Family

## 2015-03-29 ENCOUNTER — Encounter: Payer: Self-pay | Admitting: Family Medicine

## 2015-05-08 ENCOUNTER — Other Ambulatory Visit: Payer: Self-pay | Admitting: Family Medicine

## 2015-05-10 ENCOUNTER — Ambulatory Visit (INDEPENDENT_AMBULATORY_CARE_PROVIDER_SITE_OTHER): Payer: Medicare HMO

## 2015-05-10 ENCOUNTER — Ambulatory Visit (INDEPENDENT_AMBULATORY_CARE_PROVIDER_SITE_OTHER): Payer: Medicare HMO | Admitting: Family Medicine

## 2015-05-10 ENCOUNTER — Encounter: Payer: Self-pay | Admitting: Family Medicine

## 2015-05-10 VITALS — BP 132/77 | HR 79 | Temp 96.7°F | Ht 65.0 in | Wt 179.4 lb

## 2015-05-10 DIAGNOSIS — M25562 Pain in left knee: Secondary | ICD-10-CM

## 2015-05-10 NOTE — Progress Notes (Signed)
Subjective:  Patient ID: Jared Norman, male    DOB: 1940/06/25  Age: 74 y.o. MRN: [id number]  CC: Knee Pain   HPI LUISMANUEL CORMAN presents for left knee pain onset 2 days ago.. Better overnight, but worse today. 5/10 at worst. Swollen also. Concerned about gout. Had it 20 years ago. Some tingling briefly in toes early in the course.  History Zavian has a past medical history of BPH (benign prostatic hyperplasia); Vitamin D deficiency; Diabetes mellitus without complication ([place]); Hyperlipidemia; Hypertension; Kidney stones; and Cataract.   He has past surgical history that includes right foot surgery (Right, 2008); Cystoscopy with retrograde pyelogram, ureteroscopy and stent placement (Left, 12/20/2013); and Eye surgery.   His family history includes Cancer in his father; Hypertension in his brother.He reports that he has never smoked. He does not have any smokeless tobacco history on file. He reports that he does not drink alcohol or use illicit drugs.  Outpatient Prescriptions Prior to Visit  Medication Sig Dispense Refill  . amLODipine (NORVASC) 5 MG tablet TAKE ONE TABLET BY MOUTH ONCE DAILY 90 tablet 0  . aspirin EC 81 MG tablet Take 81 mg by mouth every Monday, Wednesday, and Friday.    . cholecalciferol (VITAMIN D) 1000 UNITS tablet Take 2,000 Units by mouth daily.    . finasteride (PROSCAR) 5 MG tablet TAKE ONE TABLET BY MOUTH ONCE DAILY 90 tablet 1  . ibuprofen (ADVIL,MOTRIN) 200 MG tablet Take 800 mg by mouth every 6 (six) hours as needed for moderate pain.    Marland Kitchen lisinopril (PRINIVIL,ZESTRIL) 40 MG tablet TAKE ONE TABLET BY MOUTH ONCE DAILY 90 tablet 1  . magnesium gluconate (MAGONATE) 500 MG tablet Take 500 mg by mouth daily.    Marland Kitchen omeprazole (PRILOSEC) 40 MG capsule Take 1 capsule (40 mg total) by mouth daily. 90 capsule 3  . simvastatin (ZOCOR) 40 MG tablet TAKE ONE-HALF TABLET BY MOUTH AT BEDTIME 90 tablet 1  . lisinopril (PRINIVIL,ZESTRIL) 40 MG tablet TAKE ONE TABLET BY  MOUTH ONCE DAILY 90 tablet 0  . metFORMIN (GLUCOPHAGE) 1000 MG tablet TAKE ONE TABLET BY MOUTH ONCE DAILY WITH BREAKFAST 90 tablet 0  . simvastatin (ZOCOR) 40 MG tablet Take 20 mg by mouth at bedtime.      No facility-administered medications prior to visit.    ROS Review of Systems  Constitutional: Negative for fever, chills, diaphoresis and unexpected weight change.  HENT: Negative for congestion, hearing loss, rhinorrhea and sore throat.   Eyes: Negative for visual disturbance.  Respiratory: Negative for cough and shortness of breath.   Cardiovascular: Negative for chest pain.  Gastrointestinal: Negative for abdominal pain, diarrhea and constipation.  Genitourinary: Negative for dysuria and flank pain.  Musculoskeletal: Positive for joint swelling and arthralgias.  Skin: Negative for rash.  Neurological: Negative for dizziness and headaches.  Psychiatric/Behavioral: Negative for sleep disturbance and dysphoric mood.    Objective:  BP 132/77 mmHg  Pulse 79  Temp(Src) 96.7 F (35.9 C) (Oral)  Ht '5\' 5"'  (1.651 m)  Wt 179 lb 6.4 oz (81.375 kg)  BMI 29.85 kg/m2  SpO2 99%  BP Readings from Last 3 Encounters:  05/10/15 132/77  02/20/15 144/74  11/18/14 145/75    Wt Readings from Last 3 Encounters:  05/10/15 179 lb 6.4 oz (81.375 kg)  02/20/15 179 lb (81.194 kg)  11/18/14 174 lb (78.926 kg)     Physical Exam  Constitutional: He appears well-developed and well-nourished.  HENT:  Head: Normocephalic and atraumatic.  Right  Ear: Tympanic membrane and external ear normal. No decreased hearing is noted.  Left Ear: Tympanic membrane and external ear normal. No decreased hearing is noted.  Mouth/Throat: No oropharyngeal exudate or posterior oropharyngeal erythema.  Eyes: Pupils are equal, round, and reactive to light.  Neck: Normal range of motion. Neck supple.  Cardiovascular: Normal rate and regular rhythm.   No murmur heard. Pulmonary/Chest: Breath sounds normal. No  respiratory distress.  Abdominal: Soft. Bowel sounds are normal. He exhibits no mass. There is no tenderness.  Musculoskeletal:  The left knee has full range of motion without discomfort actively and passively. Gait is normal with minimal limp. The joint lines are tender. The patella is palpable with moderate tenderness and edema.  Lachman / anterior drawer signs are negative for signs of instability and pain free. McMurray testing reveals no pop or excessive discomfort. Varus and valgus stress maneuvers do not cause ligamentous stretch or instability.   Vitals reviewed.    Lab Results  Component Value Date   WBC 8.8 02/20/2015   HGB 14.4 11/18/2014   HCT 43.3 02/20/2015   PLT 217 12/21/2013   GLUCOSE 105* 02/27/2015   CHOL 152 02/20/2015   TRIG 190* 02/20/2015   HDL 42 02/20/2015   LDLCALC 72 02/20/2015   ALT 32 02/27/2015   AST 25 02/27/2015   NA 139 02/27/2015   K 5.0 02/27/2015   CL 99 02/27/2015   CREATININE 1.25 02/27/2015   BUN 19 02/27/2015   CO2 26 02/27/2015   TSH 2.190 11/18/2014   PSA 3.1 11/18/2014   HGBA1C 6.5 02/20/2015   .thi Dg Abd 1 View  12/27/2013  CLINICAL DATA:  Left ureteral stent. EXAM: ABDOMEN - 1 VIEW COMPARISON:  CT 12/20/2013. FINDINGS: Soft tissue structures are unremarkable. 10 mm calcified stone in the left ureteropelvic junctions again noted. Left ureteral double-J stent noted in good anatomic position. Degenerative changes lumbar spine and both hips. IMPRESSION: 1. Stable position of 10 mm stone left ureteropelvic junction. 2. Double-J left ureteral stent in good anatomic position. Electronically Signed   By: Marcello Moores  Register   On: 12/27/2013 10:19    Assessment & Plan:   Kagan was seen today for knee pain.  Diagnoses and all orders for this visit:  Left knee pain -     POCT urinalysis dipstick -     Uric acid -     BMP8+EGFR -     DG Knee 1-2 Views Left; Future   I am having Mr. Campillo maintain his magnesium gluconate, ibuprofen,  cholecalciferol, aspirin EC, omeprazole, finasteride, metFORMIN, lisinopril, simvastatin, and amLODipine.  No orders of the defined types were placed in this encounter.   Use ice packs frequently as well as ibuprofen for pain.  Follow-up: Return in about 2 weeks (around 05/24/2015) for diabetes, Pain.  Claretta Fraise, M.D.

## 2015-05-10 NOTE — Patient Instructions (Signed)
Use ice packs frequently as well as ibuprofen for pain.

## 2015-05-11 LAB — BMP8+EGFR
BUN/Creatinine Ratio: 15 (ref 10–22)
BUN: 18 mg/dL (ref 8–27)
CO2: 24 mmol/L (ref 18–29)
Calcium: 9.7 mg/dL (ref 8.6–10.2)
Chloride: 100 mmol/L (ref 96–106)
Creatinine, Ser: 1.24 mg/dL (ref 0.76–1.27)
GFR calc Af Amer: 65 mL/min/{1.73_m2} (ref >59)
GFR calc non Af Amer: 57 mL/min/{1.73_m2} — ABNORMAL LOW (ref >59)
Glucose: 120 mg/dL — ABNORMAL HIGH (ref 65–99)
Potassium: 5.2 mmol/L (ref 3.5–5.2)
Sodium: 139 mmol/L (ref 134–144)

## 2015-05-11 LAB — URIC ACID: Uric Acid: 5.7 mg/dL (ref 3.7–8.6)

## 2015-05-22 ENCOUNTER — Encounter (INDEPENDENT_AMBULATORY_CARE_PROVIDER_SITE_OTHER): Payer: Medicare HMO | Admitting: Ophthalmology

## 2015-05-25 ENCOUNTER — Encounter (INDEPENDENT_AMBULATORY_CARE_PROVIDER_SITE_OTHER): Payer: Medicare HMO | Admitting: Ophthalmology

## 2015-05-25 DIAGNOSIS — H35033 Hypertensive retinopathy, bilateral: Secondary | ICD-10-CM

## 2015-05-25 DIAGNOSIS — E11311 Type 2 diabetes mellitus with unspecified diabetic retinopathy with macular edema: Secondary | ICD-10-CM

## 2015-05-25 DIAGNOSIS — H43813 Vitreous degeneration, bilateral: Secondary | ICD-10-CM | POA: Diagnosis not present

## 2015-05-25 DIAGNOSIS — E113511 Type 2 diabetes mellitus with proliferative diabetic retinopathy with macular edema, right eye: Secondary | ICD-10-CM | POA: Diagnosis not present

## 2015-05-25 DIAGNOSIS — I10 Essential (primary) hypertension: Secondary | ICD-10-CM | POA: Diagnosis not present

## 2015-05-25 DIAGNOSIS — E113592 Type 2 diabetes mellitus with proliferative diabetic retinopathy without macular edema, left eye: Secondary | ICD-10-CM | POA: Diagnosis not present

## 2015-05-26 ENCOUNTER — Encounter: Payer: Self-pay | Admitting: Family Medicine

## 2015-05-26 ENCOUNTER — Ambulatory Visit (INDEPENDENT_AMBULATORY_CARE_PROVIDER_SITE_OTHER): Payer: Medicare HMO | Admitting: Family Medicine

## 2015-05-26 VITALS — BP 151/76 | HR 77 | Temp 97.6°F | Ht 65.0 in | Wt 178.8 lb

## 2015-05-26 DIAGNOSIS — I10 Essential (primary) hypertension: Secondary | ICD-10-CM

## 2015-05-26 DIAGNOSIS — E119 Type 2 diabetes mellitus without complications: Secondary | ICD-10-CM | POA: Diagnosis not present

## 2015-05-26 DIAGNOSIS — M1710 Unilateral primary osteoarthritis, unspecified knee: Secondary | ICD-10-CM

## 2015-05-26 DIAGNOSIS — M129 Arthropathy, unspecified: Secondary | ICD-10-CM

## 2015-05-26 DIAGNOSIS — E785 Hyperlipidemia, unspecified: Secondary | ICD-10-CM

## 2015-05-26 DIAGNOSIS — E559 Vitamin D deficiency, unspecified: Secondary | ICD-10-CM | POA: Diagnosis not present

## 2015-05-26 DIAGNOSIS — E1140 Type 2 diabetes mellitus with diabetic neuropathy, unspecified: Secondary | ICD-10-CM | POA: Insufficient documentation

## 2015-05-26 DIAGNOSIS — E1142 Type 2 diabetes mellitus with diabetic polyneuropathy: Secondary | ICD-10-CM

## 2015-05-26 LAB — POCT GLYCOSYLATED HEMOGLOBIN (HGB A1C): Hemoglobin A1C: 6.7

## 2015-05-26 MED ORDER — BLOOD GLUCOSE MONITOR KIT: PACK | Status: DC

## 2015-05-26 NOTE — Progress Notes (Signed)
Subjective:  Patient ID: Jared Norman, male    DOB: May 16, 1940  Age: 75 y.o. MRN: [id number]  CC: Hypertension; Hyperlipidemia; and Benign Prostatic Hypertrophy   HPI Jared Norman presents for  follow-up of hypertension. Patient has no history of headache chest pain or shortness of breath or recent cough. Patient also denies symptoms of TIA such as numbness weakness lateralizing. Patient checks  blood pressure at home and has not had any elevated readings recently. Patient denies side effects from his medication. States taking it regularly.  Patient also  in for follow-up of elevated cholesterol. Doing well without complaints on current medication. Denies side effects of statin including myalgia and arthralgia and nausea. Also in today for liver function testing. Currently no chest pain, shortness of breath or other cardiovascular related symptoms noted.  Follow-up of diabetes. Patient does occasionally  check blood sugar at home. Readings run between 90 and 120. Brought in log showing a few readings, all in nml range for Fasting and PP. Says he doesn't know how to program his meter. Patient denies symptoms such as polyuria, polydipsia, excessive hunger, nausea No significant hypoglycemic spells noted. Medications as noted below. Taking them regularly without complication/adverse reaction being reported today.   Continued knee pain. Left gives way at times. Has not fallen. Feels weak. Recent x-ray showed tricompartmental degenerative joint disease of the left knee. Wife wonders if an MRI would be helpful   History Jared Norman has a past medical history of BPH (benign prostatic hyperplasia); Vitamin D deficiency; Diabetes mellitus without complication ([place]); Hyperlipidemia; Hypertension; Kidney stones; and Cataract.   He has past surgical history that includes right foot surgery (Right, 2008); Cystoscopy with retrograde pyelogram, ureteroscopy and stent placement (Left, 12/20/2013); and Eye  surgery.   His family history includes Cancer in his father; Hypertension in his brother.He reports that he has never smoked. He does not have any smokeless tobacco history on file. He reports that he does not drink alcohol or use illicit drugs.  Current Outpatient Prescriptions on File Prior to Visit  Medication Sig Dispense Refill  . amLODipine (NORVASC) 5 MG tablet TAKE ONE TABLET BY MOUTH ONCE DAILY 90 tablet 0  . aspirin EC 81 MG tablet Take 81 mg by mouth every Monday, Wednesday, and Friday.    . cholecalciferol (VITAMIN D) 1000 UNITS tablet Take 2,000 Units by mouth daily.    . finasteride (PROSCAR) 5 MG tablet TAKE ONE TABLET BY MOUTH ONCE DAILY 90 tablet 1  . ibuprofen (ADVIL,MOTRIN) 200 MG tablet Take 800 mg by mouth every 6 (six) hours as needed for moderate pain.    Marland Kitchen lisinopril (PRINIVIL,ZESTRIL) 40 MG tablet TAKE ONE TABLET BY MOUTH ONCE DAILY 90 tablet 1  . magnesium gluconate (MAGONATE) 500 MG tablet Take 500 mg by mouth daily.    . metFORMIN (GLUCOPHAGE) 1000 MG tablet TAKE ONE TABLET BY MOUTH ONCE DAILY WITH BREAKFAST 90 tablet 0  . omeprazole (PRILOSEC) 40 MG capsule Take 1 capsule (40 mg total) by mouth daily. 90 capsule 3  . simvastatin (ZOCOR) 40 MG tablet TAKE ONE-HALF TABLET BY MOUTH AT BEDTIME 90 tablet 1   No current facility-administered medications on file prior to visit.    ROS Review of Systems  Constitutional: Negative for fever, chills, diaphoresis and unexpected weight change.  HENT: Negative for congestion, hearing loss, rhinorrhea and sore throat.   Eyes: Negative for visual disturbance.  Respiratory: Negative for cough and shortness of breath.   Cardiovascular: Negative for chest pain.  Gastrointestinal: Negative for abdominal pain, diarrhea and constipation.  Genitourinary: Negative for dysuria and flank pain.  Musculoskeletal: Positive for arthralgias. Negative for joint swelling.  Skin: Negative for rash.  Neurological: Negative for dizziness and  headaches.  Psychiatric/Behavioral: Negative for sleep disturbance and dysphoric mood.    Objective:  BP 151/76 mmHg  Pulse 77  Temp(Src) 97.6 F (36.4 C) (Oral)  Ht '5\' 5"'  (1.651 m)  Wt 178 lb 12.8 oz (81.103 kg)  BMI 29.75 kg/m2  SpO2 99%  BP Readings from Last 3 Encounters:  05/26/15 151/76  05/10/15 132/77  02/20/15 144/74    Wt Readings from Last 3 Encounters:  05/26/15 178 lb 12.8 oz (81.103 kg)  05/10/15 179 lb 6.4 oz (81.375 kg)  02/20/15 179 lb (81.194 kg)     Physical Exam  Constitutional: He is oriented to person, place, and time. He appears well-developed and well-nourished. No distress.  HENT:  Head: Normocephalic and atraumatic.  Right Ear: External ear normal.  Left Ear: External ear normal.  Nose: Nose normal.  Mouth/Throat: Oropharynx is clear and moist.  Eyes: Conjunctivae and EOM are normal. Pupils are equal, round, and reactive to light.  Neck: Normal range of motion. Neck supple. No thyromegaly present.  Cardiovascular: Normal rate, regular rhythm and normal heart sounds.   No murmur heard. Pulmonary/Chest: Effort normal and breath sounds normal. No respiratory distress. He has no wheezes. He has no rales.  Abdominal: Soft. Bowel sounds are normal. He exhibits no distension. There is no tenderness.  Lymphadenopathy:    He has no cervical adenopathy.  Neurological: He is alert and oriented to person, place, and time. He has normal reflexes.  Skin: Skin is warm and dry.  Psychiatric: He has a normal mood and affect. His behavior is normal. Judgment and thought content normal.   Diabetic Foot Form - Detailed   Diabetic Foot Exam - detailed    Semmes-Weinstein Monofilament Test  R Foot Test Control:  Pos L Foot Test Control:  Pos  R Site 1-Great Toe:  Neg   R Site 4:  Pos L Site 4:  Pos  R Site 5:  Pos L Site 5:  Pos    Comments:  Decreased sensation noted at 2 and 3 on the right       Lab Results  Component Value Date   HGBA1C 6.7  05/26/2015   HGBA1C 6.5 02/20/2015   HGBA1C 6.7 11/18/2014    Lab Results  Component Value Date   WBC 8.8 02/20/2015   HGB 14.4 11/18/2014   HCT 43.3 02/20/2015   PLT 221 02/20/2015   GLUCOSE 120* 05/10/2015   CHOL 152 02/20/2015   TRIG 190* 02/20/2015   HDL 42 02/20/2015   LDLCALC 72 02/20/2015   ALT 32 02/27/2015   AST 25 02/27/2015   NA 139 05/10/2015   K 5.2 05/10/2015   CL 100 05/10/2015   CREATININE 1.24 05/10/2015   BUN 18 05/10/2015   CO2 24 05/10/2015   TSH 2.190 11/18/2014   HGBA1C 6.7 05/26/2015    Dg Abd 1 View  12/27/2013  CLINICAL DATA:  Left ureteral stent. EXAM: ABDOMEN - 1 VIEW COMPARISON:  CT 12/20/2013. FINDINGS: Soft tissue structures are unremarkable. 10 mm calcified stone in the left ureteropelvic junctions again noted. Left ureteral double-J stent noted in good anatomic position. Degenerative changes lumbar spine and both hips. IMPRESSION: 1. Stable position of 10 mm stone left ureteropelvic junction. 2. Double-J left ureteral stent in good anatomic position. Electronically  Signed   By: Marcello Moores  Register   On: 12/27/2013 10:19    Assessment & Plan:   Jared Norman was seen today for hypertension, hyperlipidemia and benign prostatic hypertrophy.  Diagnoses and all orders for this visit:  Diabetes mellitus without complication ([place]) -     POCT glycosylated hemoglobin (Hb A1C) -     CBC with Differential/Platelet  Hyperlipidemia -     CMP14+EGFR -     Lipid panel -     CBC with Differential/Platelet  Essential hypertension -     Cancel: POCT CBC -     Cancel: POCT CBC -     CMP14+EGFR -     CBC with Differential/Platelet  Vitamin D deficiency -     Cancel: POCT CBC -     Cancel: POCT CBC  Diabetic polyneuropathy associated with type 2 diabetes mellitus (HCC)  Arthritis of knee -     MR Knee Left W Contrast; Future  Other orders -     blood glucose meter kit and supplies KIT; Dispense based on patient and insurance preference. Use up to four  times daily as directed. (FOR ICD-9 250.00, 250.01).   I am having Mr. Schreiner start on blood glucose meter kit and supplies. I am also having him maintain his magnesium gluconate, ibuprofen, cholecalciferol, aspirin EC, omeprazole, finasteride, metFORMIN, lisinopril, simvastatin, and amLODipine.  Meds ordered this encounter  Medications  . blood glucose meter kit and supplies KIT    Sig: Dispense based on patient and insurance preference. Use up to four times daily as directed. (FOR ICD-9 250.00, 250.01).    Dispense:  1 each    Refill:  3    Order Specific Question:  Number of strips    Answer:  200    Order Specific Question:  Number of lancets    Answer:  200     Follow-up: Return in about 3 months (around 08/24/2015) for diabetes, cholesterol, hypertension.  Claretta Fraise, M.D.

## 2015-05-27 LAB — LIPID PANEL
Chol/HDL Ratio: 3.2 ratio units (ref 0.0–5.0)
Cholesterol, Total: 134 mg/dL (ref 100–199)
HDL: 42 mg/dL (ref >39)
LDL Calculated: 69 mg/dL (ref 0–99)
Triglycerides: 117 mg/dL (ref 0–149)
VLDL Cholesterol Cal: 23 mg/dL (ref 5–40)

## 2015-05-27 LAB — CMP14+EGFR
ALT: 25 IU/L (ref 0–44)
AST: 23 IU/L (ref 0–40)
Albumin/Globulin Ratio: 1.6 (ref 1.1–2.5)
Albumin: 4.2 g/dL (ref 3.5–4.8)
Alkaline Phosphatase: 75 IU/L (ref 39–117)
BUN/Creatinine Ratio: 15 (ref 10–22)
BUN: 17 mg/dL (ref 8–27)
Bilirubin Total: 0.6 mg/dL (ref 0.0–1.2)
CO2: 25 mmol/L (ref 18–29)
Calcium: 9.5 mg/dL (ref 8.6–10.2)
Chloride: 99 mmol/L (ref 96–106)
Creatinine, Ser: 1.15 mg/dL (ref 0.76–1.27)
GFR calc Af Amer: 72 mL/min/{1.73_m2} (ref >59)
GFR calc non Af Amer: 62 mL/min/{1.73_m2} (ref >59)
Globulin, Total: 2.7 g/dL (ref 1.5–4.5)
Glucose: 140 mg/dL — ABNORMAL HIGH (ref 65–99)
Potassium: 5.5 mmol/L — ABNORMAL HIGH (ref 3.5–5.2)
Sodium: 139 mmol/L (ref 134–144)
Total Protein: 6.9 g/dL (ref 6.0–8.5)

## 2015-05-27 LAB — CBC WITH DIFFERENTIAL/PLATELET
Basophils Absolute: 0.1 10*3/uL (ref 0.0–0.2)
Basos: 1 %
EOS (ABSOLUTE): 0.3 10*3/uL (ref 0.0–0.4)
Eos: 4 %
Hematocrit: 43.5 % (ref 37.5–51.0)
Hemoglobin: 15 g/dL (ref 12.6–17.7)
Immature Grans (Abs): 0 10*3/uL (ref 0.0–0.1)
Immature Granulocytes: 0 %
Lymphocytes Absolute: 2.5 10*3/uL (ref 0.7–3.1)
Lymphs: 33 %
MCH: 32 pg (ref 26.6–33.0)
MCHC: 34.5 g/dL (ref 31.5–35.7)
MCV: 93 fL (ref 79–97)
Monocytes Absolute: 0.7 10*3/uL (ref 0.1–0.9)
Monocytes: 9 %
Neutrophils Absolute: 4.1 10*3/uL (ref 1.4–7.0)
Neutrophils: 53 %
Platelets: 221 10*3/uL (ref 150–379)
RBC: 4.69 x10E6/uL (ref 4.14–5.80)
RDW: 13.9 % (ref 12.3–15.4)
WBC: 7.6 10*3/uL (ref 3.4–10.8)

## 2015-06-01 ENCOUNTER — Telehealth: Payer: Self-pay | Admitting: Family Medicine

## 2015-06-02 ENCOUNTER — Other Ambulatory Visit: Payer: Self-pay

## 2015-06-02 DIAGNOSIS — M25562 Pain in left knee: Secondary | ICD-10-CM

## 2015-06-02 DIAGNOSIS — E119 Type 2 diabetes mellitus without complications: Secondary | ICD-10-CM | POA: Diagnosis not present

## 2015-06-02 NOTE — Telephone Encounter (Signed)
done

## 2015-06-02 NOTE — Telephone Encounter (Signed)
Gave verbal per wife and Dr. Livia Snellen

## 2015-06-05 DIAGNOSIS — N138 Other obstructive and reflux uropathy: Secondary | ICD-10-CM | POA: Diagnosis not present

## 2015-06-05 DIAGNOSIS — N401 Enlarged prostate with lower urinary tract symptoms: Secondary | ICD-10-CM | POA: Diagnosis not present

## 2015-06-05 DIAGNOSIS — N281 Cyst of kidney, acquired: Secondary | ICD-10-CM | POA: Diagnosis not present

## 2015-06-05 DIAGNOSIS — N200 Calculus of kidney: Secondary | ICD-10-CM | POA: Diagnosis not present

## 2015-06-05 DIAGNOSIS — Z0000 Encounter for general adult medical examination without abnormal findings: Secondary | ICD-10-CM | POA: Diagnosis not present

## 2015-06-13 ENCOUNTER — Ambulatory Visit (HOSPITAL_COMMUNITY)
Admission: RE | Admit: 2015-06-13 | Discharge: 2015-06-13 | Disposition: A | Discharge status: Home / Self Care | Payer: Medicare HMO | Source: Ambulatory Visit | Attending: Family Medicine | Admitting: Family Medicine

## 2015-06-13 DIAGNOSIS — M179 Osteoarthritis of knee, unspecified: Secondary | ICD-10-CM | POA: Diagnosis not present

## 2015-06-13 DIAGNOSIS — M25562 Pain in left knee: Secondary | ICD-10-CM

## 2015-06-13 DIAGNOSIS — M7122 Synovial cyst of popliteal space [Baker], left knee: Secondary | ICD-10-CM | POA: Diagnosis not present

## 2015-06-14 ENCOUNTER — Other Ambulatory Visit: Payer: Self-pay | Admitting: *Deleted

## 2015-06-14 DIAGNOSIS — M1710 Unilateral primary osteoarthritis, unspecified knee: Secondary | ICD-10-CM

## 2015-06-20 ENCOUNTER — Other Ambulatory Visit: Payer: Self-pay | Admitting: Family Medicine

## 2015-06-21 DIAGNOSIS — M1712 Unilateral primary osteoarthritis, left knee: Secondary | ICD-10-CM | POA: Diagnosis not present

## 2015-06-21 DIAGNOSIS — M76899 Other specified enthesopathies of unspecified lower limb, excluding foot: Secondary | ICD-10-CM | POA: Diagnosis not present

## 2015-06-23 ENCOUNTER — Other Ambulatory Visit: Payer: Self-pay | Admitting: Family Medicine

## 2015-07-24 ENCOUNTER — Ambulatory Visit: Payer: Medicare HMO | Attending: Orthopedic Surgery | Admitting: Physical Therapy

## 2015-07-24 DIAGNOSIS — R5381 Other malaise: Secondary | ICD-10-CM | POA: Insufficient documentation

## 2015-07-24 DIAGNOSIS — M25562 Pain in left knee: Secondary | ICD-10-CM

## 2015-07-24 DIAGNOSIS — R29898 Other symptoms and signs involving the musculoskeletal system: Secondary | ICD-10-CM | POA: Diagnosis not present

## 2015-07-24 DIAGNOSIS — M25662 Stiffness of left knee, not elsewhere classified: Secondary | ICD-10-CM

## 2015-07-24 NOTE — Therapy (Signed)
[facility] [place], [place], [address] Phone: [phone]   Fax:  [phone]  Physical Therapy Evaluation  Patient Details  Name: Jared Norman MRN: [id number] Date of Birth: 04/16/1941 Referring Provider: Rod Can MD  Encounter Date: 07/24/2015      PT End of Session - 07/24/15 0936    Visit Number 1   Number of Visits 12   Date for PT Re-Evaluation 09/04/15   PT Start Time 0900   PT Stop Time 0944   PT Time Calculation (min) 44 min      Past Medical History  Diagnosis Date  . BPH (benign prostatic hyperplasia)   . Vitamin D deficiency   . Diabetes mellitus without complication ([place])   . Hyperlipidemia   . Hypertension   . Kidney stones   . Cataract     Past Surgical History  Procedure Laterality Date  . Right foot surgery Right 2008    tendon rupture  . Cystoscopy with retrograde pyelogram, ureteroscopy and stent placement Left 12/20/2013    Procedure: CYSTOSCOPY WITHLEFT RETROGRADE PYELOGRAM,  AND LEFT STENT PLACEMENT;  Surgeon: Malka So, MD;  Location: [facility];  Service: Urology;  Laterality: Left;  [facility] Eye surgery      There were no vitals filed for this visit.  Visit Diagnosis:  Left knee pain - Plan: PT plan of care cert/re-cert  Decreased range of motion of left knee - Plan: PT plan of care cert/re-cert  Debility - Plan: PT plan of care cert/re-cert      Subjective Assessment - 07/24/15 0913    Subjective My pain is a 3/10 today.   Limitations Standing;Walking   How long can you stand comfortably? 20-30 minutes.   How long can you walk comfortably? Short community distance.  Staits are painful.   Patient Stated Goals Get out of pain.   Currently in Pain? Yes   Pain Score 3    Pain Location Knee   Pain Orientation Left   Pain Descriptors / Indicators Aching   Pain Type --  "Sub-acute."   Pain Onset More than a month ago   Pain Frequency Constant   Aggravating Factors  Stairs;  walking and standing.   Pain Relieving Factors Meds and rest.            [facility] PT Assessment - 07/24/15 0001    Assessment   Medical Diagnosis Primary OA of left knnee.   Referring Provider Rod Can MD   Onset Date/Surgical Date --  05/12/15.   Hand Dominance Right   Precautions   Precautions --  Pain-free ther ex.   Restrictions   Weight Bearing Restrictions No   Balance Screen   Has the patient fallen in the past 6 months Yes   How many times? 1 tripped over dog.   Has the patient had a decrease in activity level because of a fear of falling?  No   Is the patient reluctant to leave their home because of a fear of falling?  No   Home Environment   Living Environment Private residence   Additional Comments 3-4 steps into house. and 8 steps back of house.   Prior Function   Level of Independence Independent   Observation/Other Assessments   Observations Left knee abrasion in area of patellar tendon.  Left kneecap sits laterally and is tilted laterally..   Observation/Other Assessments-Edema    Edema Circumferential   Circumferential Edema   Circumferential - Left  2  cms > left than right.   ROM / Strength   AROM / PROM / Strength AROM;Strength   AROM   Overall AROM Comments -15 degrees of left knee extension and 115 degrees of flexion and 13 degrees of left hip IR.   Strength   Overall Strength Comments Left hip abduction= 4 to 4+/5.  Left knee 4+/5.   Palpation   Palpation comment Pain "in" left knee.  Some palapable pain around the periphery of the left patella.   Special Tests    Special Tests Knee Special Tests   Knee Special tests  --  (+) left patellar compression test.   Transfers   Transfers Independent with all Transfers   Ambulation/Gait   Gait Comments Minimal decrease in left stance time.                   [facility] Adult PT Treatment/Exercise - Aug 02, 2015 0001    Exercises   Exercises Knee/Hip   Knee/Hip Exercises: Aerobic   Stationary  Bike Level 2 x 10 minutes.                  PT Short Term Goals - August 02, 2015 [id number]    PT SHORT TERM GOAL #1   Title Ind with a HEP.   Time 2   Period Weeks   Status New   PT SHORT TERM GOAL #2   Title Equal bilateral active knee extension.   Time 2   Period Weeks   Status New           PT Long Term Goals - August 02, 2015 [id number]    PT LONG TERM GOAL #1   Title Increase knee strength to a solid 5/5 to provide good stability for accomplishment of functional activities   Time 4   Period Weeks   Status New   PT LONG TERM GOAL #2   Title Perform a reciprocating stair gait with one railing with pain not > 2-3/10.   Time 4   Period Weeks   Status New   PT LONG TERM GOAL #3   Title Walk a community distance wiht pain not > 3/10.   Time 4   Period Weeks   Status New   PT LONG TERM GOAL #4   Title Stand 30 minutes with pain not > 3/10.               Plan - 08-02-15 0936    Clinical Impression Statement Thge patient reports ongoing and worsenening left knee pain since 05/12/15.  His resting pain-level is a 3/10 today but his pain rises to 7-7+/10 with increased walking, standing and especially stairs.  He states his X-rays reveal his left knee is "bone on bone."   Pt will benefit from skilled therapeutic intervention in order to improve on the following deficits Pain;Decreased range of motion;Decreased activity tolerance   Rehab Potential Good   PT Frequency 3x / week   PT Duration 4 weeks   PT Treatment/Interventions Therapeutic exercise;Therapeutic activities;Manual techniques;Vasopneumatic Device   PT Next Visit Plan Pain-free left quadriceps strengthening; left hip abduction strengthening; "clamshell:; stretch to increase left hip IR.          G-Codes - 2015/08/02 0947    Functional Assessment Tool Used FOTO....55% limitation.   Functional Limitation Mobility: Walking and moving around   Mobility: Walking and Moving Around Current Status [phone]) At least 40  percent but less than 60 percent impaired, limited or restricted   Mobility: Walking and Moving Around  Goal Status [id number]) At least 20 percent but less than 40 percent impaired, limited or restricted       Problem List Patient Active Problem List   Diagnosis Date Noted  . Diabetic neuropathy ([place]) 05/26/2015  . Arthritis of knee 05/26/2015  . BPH (benign prostatic hyperplasia)   . Vitamin D deficiency   . Diabetes mellitus without complication ([place])   . Hyperlipidemia   . Hypertension     Jared Norman, [place] MPT 07/24/2015, 9:55 AM  [facility] [place], [place], [address] Phone: [phone]   Fax:  [phone]  Name: Jared Norman MRN: [id number] Date of Birth: 1940-11-10

## 2015-07-27 ENCOUNTER — Encounter: Payer: Self-pay | Admitting: Physical Therapy

## 2015-07-27 ENCOUNTER — Ambulatory Visit: Payer: Medicare HMO | Admitting: Physical Therapy

## 2015-07-27 DIAGNOSIS — M25562 Pain in left knee: Secondary | ICD-10-CM | POA: Diagnosis not present

## 2015-07-27 DIAGNOSIS — M25662 Stiffness of left knee, not elsewhere classified: Secondary | ICD-10-CM

## 2015-07-27 DIAGNOSIS — R5381 Other malaise: Secondary | ICD-10-CM

## 2015-07-27 NOTE — Patient Instructions (Signed)
Strengthening: Hip Abduction (Side-Lying)  Strengthening: Straight Leg Raise (Phase 1)  Repeat _10___ times per set. Do __2__ sets per session. Do __2__ sessions per day.     Bridging   Slowly raise buttocks from floor, keeping stomach tight. Repeat _10___ times per set. Do __2__ sets per session. Do __2__ sessions per day.   Straight Leg Raise   Tighten stomach and slowly raise locked right leg __4__ inches from floor. Repeat __10-30__ times per set. Do __2__ sets per session. Do __2__ sessions per day.   

## 2015-07-27 NOTE — Therapy (Signed)
[facility] [place], [place], [address] Phone: [phone]   Fax:  [phone]  Physical Therapy Treatment  Patient Details  Name: RENNE Norman MRN: [id number] Date of Birth: 02/23/1941 Referring Provider: Rod Can MD  Encounter Date: 07/27/2015      PT End of Session - 07/27/15 0844    Visit Number 2   Number of Visits 12   Date for PT Re-Evaluation 09/04/15   PT Start Time 0814   PT Stop Time 0856   PT Time Calculation (min) 42 min   Activity Tolerance Patient tolerated treatment well   Behavior During Therapy [facility] for tasks assessed/performed      Past Medical History  Diagnosis Date  . BPH (benign prostatic hyperplasia)   . Vitamin D deficiency   . Diabetes mellitus without complication ([place])   . Hyperlipidemia   . Hypertension   . Kidney stones   . Cataract     Past Surgical History  Procedure Laterality Date  . Right foot surgery Right 2008    tendon rupture  . Cystoscopy with retrograde pyelogram, ureteroscopy and stent placement Left 12/20/2013    Procedure: CYSTOSCOPY WITHLEFT RETROGRADE PYELOGRAM,  AND LEFT STENT PLACEMENT;  Surgeon: Malka So, MD;  Location: [facility];  Service: Urology;  Laterality: Left;  Marland Kitchen Eye surgery      There were no vitals filed for this visit.  Visit Diagnosis:  Left knee pain  Decreased range of motion of left knee  Debility      Subjective Assessment - 07/27/15 0817    Subjective Patient reported no more pain today, tolerated treatment well last    Limitations Standing;Walking   How long can you stand comfortably? 20-30 minutes.   How long can you walk comfortably? Short community distance.  Staits are painful.   Patient Stated Goals Get out of pain.   Currently in Pain? Yes   Pain Score 3    Pain Location Knee   Pain Orientation Left   Pain Descriptors / Indicators Aching   Pain Onset More than a month ago   Pain Frequency Constant   Aggravating  Factors  prolong standing    Pain Relieving Factors rest and meds                         [facility] Adult PT Treatment/Exercise - 07/27/15 0001    Knee/Hip Exercises: Stretches   Other Knee/Hip Stretches seated for hip IR stretch 30secx2   Knee/Hip Exercises: Aerobic   Stationary Bike Level 2 x 10 minutes.   Nustep x78min L5 UE/LE   Knee/Hip Exercises: Standing   Terminal Knee Extension Limitations Pink XTS [id number]   Lateral Step Up Left;Hand Hold: 2;Step Height: 6";10 reps;3 sets   Forward Step Up Left;3 sets;10 reps;Hand Hold: 2;Step Height: 6"   Knee/Hip Exercises: Seated   Long Arc Quad Strengthening;Left;3 sets;10 reps;Weights   Long Arc Quad Weight 3 lbs.   Abduction/Adduction  Strengthening;Left;3 sets;10 reps  green tband /hip abd   Knee/Hip Exercises: Supine   Bridges Limitations 2x10   Straight Leg Raise with External Rotation Strengthening;Left;2 sets;10 reps   Knee/Hip Exercises: Sidelying   Hip ABduction Strengthening;Left;2 sets;10 reps   Clams 2x10                PT Education - 07/27/15 0843    Education provided Yes   Education Details HEP   Person(s) Educated Patient   Methods Explanation;Demonstration;Handout  Comprehension Verbalized understanding;Returned demonstration          PT Short Term Goals - 07/27/15 0845    PT SHORT TERM GOAL #1   Title Ind with a HEP.   Time 2   Period Weeks   Status Achieved   PT SHORT TERM GOAL #2   Title Equal bilateral active knee extension.   Time 2   Period Weeks   Status On-going           PT Long Term Goals - 07/24/15 [id number]    PT LONG TERM GOAL #1   Title Increase knee strength to a solid 5/5 to provide good stability for accomplishment of functional activities   Time 4   Period Weeks   Status New   PT LONG TERM GOAL #2   Title Perform a reciprocating stair gait with one railing with pain not > 2-3/10.   Time 4   Period Weeks   Status New   PT LONG TERM GOAL #3   Title Walk a  community distance wiht pain not > 3/10.   Time 4   Period Weeks   Status New   PT LONG TERM GOAL #4   Title Stand 30 minutes with pain not > 3/10.               Plan - 07/27/15 0847    Clinical Impression Statement Patient tolerated treatment well today with no pain reports. Patient had good technique with exercises. HEP given today with good understanding. Patient able to meet STG#1 and other goals ongoing due to pain and strength limitations.   Pt will benefit from skilled therapeutic intervention in order to improve on the following deficits Pain;Decreased range of motion;Decreased activity tolerance   Rehab Potential Good   PT Frequency 3x / week   PT Duration 4 weeks   PT Treatment/Interventions Therapeutic exercise;Therapeutic activities;Manual techniques;Vasopneumatic Device   PT Next Visit Plan Pain-free left quadriceps strengthening; left hip abduction strengthening; "clamshell:; stretch to increase left hip IR.   Consulted and Agree with Plan of Care Patient        Problem List Patient Active Problem List   Diagnosis Date Noted  . Diabetic neuropathy ([place]) 05/26/2015  . Arthritis of knee 05/26/2015  . BPH (benign prostatic hyperplasia)   . Vitamin D deficiency   . Diabetes mellitus without complication ([place])   . Hyperlipidemia   . Hypertension     Nery Kalisz P, PTA 07/27/2015, 8:56 AM  [facility] [place], [place], [address] Phone: [phone]   Fax:  [phone]  Name: Jared Norman MRN: [id number] Date of Birth: 07-01-1940

## 2015-07-31 ENCOUNTER — Encounter (INDEPENDENT_AMBULATORY_CARE_PROVIDER_SITE_OTHER): Payer: Medicare HMO | Admitting: Ophthalmology

## 2015-07-31 DIAGNOSIS — H35033 Hypertensive retinopathy, bilateral: Secondary | ICD-10-CM

## 2015-07-31 DIAGNOSIS — E113592 Type 2 diabetes mellitus with proliferative diabetic retinopathy without macular edema, left eye: Secondary | ICD-10-CM | POA: Diagnosis not present

## 2015-07-31 DIAGNOSIS — H43813 Vitreous degeneration, bilateral: Secondary | ICD-10-CM | POA: Diagnosis not present

## 2015-07-31 DIAGNOSIS — I10 Essential (primary) hypertension: Secondary | ICD-10-CM | POA: Diagnosis not present

## 2015-07-31 DIAGNOSIS — E113511 Type 2 diabetes mellitus with proliferative diabetic retinopathy with macular edema, right eye: Secondary | ICD-10-CM

## 2015-07-31 DIAGNOSIS — E11311 Type 2 diabetes mellitus with unspecified diabetic retinopathy with macular edema: Secondary | ICD-10-CM | POA: Diagnosis not present

## 2015-07-31 LAB — HM DIABETES EYE EXAM

## 2015-08-01 ENCOUNTER — Encounter: Payer: Medicare HMO | Admitting: Physical Therapy

## 2015-08-03 ENCOUNTER — Ambulatory Visit: Payer: Medicare HMO | Admitting: Physical Therapy

## 2015-08-03 ENCOUNTER — Encounter: Payer: Self-pay | Admitting: Physical Therapy

## 2015-08-03 DIAGNOSIS — M25562 Pain in left knee: Secondary | ICD-10-CM

## 2015-08-03 DIAGNOSIS — R5381 Other malaise: Secondary | ICD-10-CM

## 2015-08-03 DIAGNOSIS — M25662 Stiffness of left knee, not elsewhere classified: Secondary | ICD-10-CM

## 2015-08-03 NOTE — Therapy (Signed)
[name], [place], [address] Phone: [phone]   Fax:  [phone]  Physical Therapy Treatment  Patient Details  Name: Jared Norman MRN: [id number] Date of Birth: 10/20/1940 Referring Provider: Rod Can MD  Encounter Date: 08/03/2015      PT End of Session - 08/03/15 0753    Visit Number 3   Number of Visits 12   Date for PT Re-Evaluation 09/04/15   PT Start Time 0728   PT Stop Time 0811   PT Time Calculation (min) 43 min   Activity Tolerance Patient tolerated treatment well   Behavior During Therapy [facility] for tasks assessed/performed      Past Medical History  Diagnosis Date  . BPH (benign prostatic hyperplasia)   . Vitamin D deficiency   . Diabetes mellitus without complication ([place])   . Hyperlipidemia   . Hypertension   . Kidney stones   . Cataract     Past Surgical History  Procedure Laterality Date  . Right foot surgery Right 2008    tendon rupture  . Cystoscopy with retrograde pyelogram, ureteroscopy and stent placement Left 12/20/2013    Procedure: CYSTOSCOPY WITHLEFT RETROGRADE PYELOGRAM,  AND LEFT STENT PLACEMENT;  Surgeon: Malka So, MD;  Location: [facility];  Service: Urology;  Laterality: Left;  Marland Kitchen Eye surgery      There were no vitals filed for this visit.  Visit Diagnosis:  Left knee pain  Decreased range of motion of left knee  Debility      Subjective Assessment - 08/03/15 0742    Subjective Patient reported no more pain today, tolerated treatment well last    Limitations Standing;Walking   How long can you stand comfortably? 20-30 minutes.   How long can you walk comfortably? Short community distance.  Staits are painful.   Patient Stated Goals Get out of pain.   Currently in Pain? No/denies                         [facility] Adult PT Treatment/Exercise - 08/03/15 0001    Knee/Hip Exercises: Stretches   Other Knee/Hip Stretches seated for hip IR  stretch 30sec x 3   Knee/Hip Exercises: Aerobic   Stationary Bike Level 2 x 10 minutes.   Knee/Hip Exercises: Standing   Terminal Knee Extension Limitations with red t-band x30   Hip ADduction Strengthening;Left;2 sets;10 reps  red t-band    Lateral Step Up Left;Hand Hold: 2;Step Height: 6";10 reps;3 sets   Forward Step Up Left;3 sets;10 reps;Hand Hold: 2;Step Height: 6"   Rocker Board 3 minutes   Knee/Hip Exercises: Seated   Long Arc Quad Strengthening;Left;3 sets;10 reps;Weights   Long Arc Quad Weight 3 lbs.   Knee/Hip Exercises: Supine   Bridges Limitations 2x10   Straight Leg Raise with External Rotation Strengthening;Left;10 reps;3 sets   Knee/Hip Exercises: Sidelying   Hip ABduction Strengthening;Left;2 sets;10 reps   Clams 2x10                  PT Short Term Goals - 07/27/15 0845    PT SHORT TERM GOAL #1   Title Ind with a HEP.   Time 2   Period Weeks   Status Achieved   PT SHORT TERM GOAL #2   Title Equal bilateral active knee extension.   Time 2   Period Weeks   Status On-going           PT Long Term Goals -  08/03/15 0753    PT LONG TERM GOAL #1   Title Increase knee strength to a solid 5/5 to provide good stability for accomplishment of functional activities   Time 4   Period Weeks   Status On-going   PT LONG TERM GOAL #2   Title Perform a reciprocating stair gait with one railing with pain not > 2-3/10.   Time 4   Period Weeks   Status On-going   PT LONG TERM GOAL #3   Title Walk a community distance wiht pain not > 3/10.   Time 4   Period Weeks   Status On-going   PT LONG TERM GOAL #4   Title Stand 30 minutes with pain not > 3/10.   Time 4   Period Weeks   Status On-going  15 min at this time per patient 08/03/15               Plan - 08/03/15 0808    Clinical Impression Statement Patient progressing with strengthening activities today. Patient able to tolerate all exercises with no pain and only some fatigue reported.  Patient has been doing HEP daily with no difficulty. Patient unable to meet any further goals due to pain limitations at this time, he is only able to tolerate standing for 15 min at a time due to pain with prolong standing. Patient had no antalgic gait today.   Pt will benefit from skilled therapeutic intervention in order to improve on the following deficits Pain;Decreased range of motion;Decreased activity tolerance   Rehab Potential Good   PT Frequency 3x / week   PT Duration 4 weeks   PT Treatment/Interventions Therapeutic exercise;Therapeutic activities;Manual techniques;Vasopneumatic Device   PT Next Visit Plan cont with POC per MPT for Pain-free left quadriceps strengthening; left hip abduction strengthening; "clamshell:; stretch to increase left hip IR.   Consulted and Agree with Plan of Care Patient        Problem List Patient Active Problem List   Diagnosis Date Noted  . Diabetic neuropathy ([place]) 05/26/2015  . Arthritis of knee 05/26/2015  . BPH (benign prostatic hyperplasia)   . Vitamin D deficiency   . Diabetes mellitus without complication ([place])   . Hyperlipidemia   . Hypertension     Zamar Odwyer P, PTA 08/03/2015, 8:13 AM  [facility] [place], [place], [address] Phone: [phone]   Fax:  [phone]  Name: Jared Norman MRN: [id number] Date of Birth: 26-Nov-1940

## 2015-08-04 ENCOUNTER — Other Ambulatory Visit: Payer: Self-pay | Admitting: Family Medicine

## 2015-08-07 DIAGNOSIS — M7052 Other bursitis of knee, left knee: Secondary | ICD-10-CM | POA: Diagnosis not present

## 2015-08-07 DIAGNOSIS — M1712 Unilateral primary osteoarthritis, left knee: Secondary | ICD-10-CM | POA: Diagnosis not present

## 2015-08-07 DIAGNOSIS — M76899 Other specified enthesopathies of unspecified lower limb, excluding foot: Secondary | ICD-10-CM | POA: Diagnosis not present

## 2015-08-08 ENCOUNTER — Encounter: Payer: Medicare HMO | Admitting: Physical Therapy

## 2015-08-11 ENCOUNTER — Encounter: Payer: Medicare HMO | Admitting: Physical Therapy

## 2015-08-14 NOTE — Therapy (Signed)
[facility] [place], [place], [address] Phone: [phone]   Fax:  [phone]  Physical Therapy Treatment  Patient Details  Name: Jared Norman MRN: [id number] Date of Birth: 09-Nov-1940 Referring Provider: Rod Can MD  Encounter Date: 08/03/2015    Past Medical History  Diagnosis Date  . BPH (benign prostatic hyperplasia)   . Vitamin D deficiency   . Diabetes mellitus without complication ([place])   . Hyperlipidemia   . Hypertension   . Kidney stones   . Cataract     Past Surgical History  Procedure Laterality Date  . Right foot surgery Right 2008    tendon rupture  . Cystoscopy with retrograde pyelogram, ureteroscopy and stent placement Left 12/20/2013    Procedure: CYSTOSCOPY WITHLEFT RETROGRADE PYELOGRAM,  AND LEFT STENT PLACEMENT;  Surgeon: Malka So, MD;  Location: [facility];  Service: Urology;  Laterality: Left;  Marland Kitchen Eye surgery      There were no vitals filed for this visit.  Visit Diagnosis:  Left knee pain  Decreased range of motion of left knee  Debility                                 PT Short Term Goals - 07/27/15 0845    PT SHORT TERM GOAL #1   Title Ind with a HEP.   Time 2   Period Weeks   Status Achieved   PT SHORT TERM GOAL #2   Title Equal bilateral active knee extension.   Time 2   Period Weeks   Status On-going           PT Long Term Goals - 08/03/15 0753    PT LONG TERM GOAL #1   Title Increase knee strength to a solid 5/5 to provide good stability for accomplishment of functional activities   Time 4   Period Weeks   Status On-going   PT LONG TERM GOAL #2   Title Perform a reciprocating stair gait with one railing with pain not > 2-3/10.   Time 4   Period Weeks   Status On-going   PT LONG TERM GOAL #3   Title Walk a community distance wiht pain not > 3/10.   Time 4   Period Weeks   Status On-going   PT LONG TERM GOAL #4   Title Stand  30 minutes with pain not > 3/10.   Time 4   Period Weeks   Status On-going  15 min at this time per patient 08/03/15               Problem List Patient Active Problem List   Diagnosis Date Noted  . Diabetic neuropathy ([place]) 05/26/2015  . Arthritis of knee 05/26/2015  . BPH (benign prostatic hyperplasia)   . Vitamin D deficiency   . Diabetes mellitus without complication ([place])   . Hyperlipidemia   . Hypertension    PHYSICAL THERAPY DISCHARGE SUMMARY  Visits from Start of Care: 3  Current functional level related to goals / functional outcomes: Please see above.   Remaining deficits: Continued left knee pain.   Education / Equipment: HEP.  Plan: Patient agrees to discharge.  Patient goals were not met. Patient is being discharged due to the physician's request.  ?????      Keaundra Stehle, [place] MPT 08/14/2015, 6:43 PM  [facility] [place], [place], [address] Phone: [phone]  Fax:  [phone]  Name: Jared Norman MRN: [id number] Date of Birth: [birth year]

## 2015-09-06 ENCOUNTER — Ambulatory Visit (INDEPENDENT_AMBULATORY_CARE_PROVIDER_SITE_OTHER): Payer: Medicare HMO | Admitting: Family Medicine

## 2015-09-06 ENCOUNTER — Encounter: Payer: Self-pay | Admitting: Family Medicine

## 2015-09-06 VITALS — BP 146/72 | HR 66 | Temp 97.9°F | Ht 65.0 in | Wt 179.4 lb

## 2015-09-06 DIAGNOSIS — E119 Type 2 diabetes mellitus without complications: Secondary | ICD-10-CM

## 2015-09-06 DIAGNOSIS — I10 Essential (primary) hypertension: Secondary | ICD-10-CM | POA: Diagnosis not present

## 2015-09-06 LAB — CMP14+EGFR
ALT: 24 IU/L (ref 0–44)
AST: 18 IU/L (ref 0–40)
Albumin/Globulin Ratio: 1.8 (ref 1.2–2.2)
Albumin: 4.3 g/dL (ref 3.5–4.8)
Alkaline Phosphatase: 64 IU/L (ref 39–117)
BUN/Creatinine Ratio: 19 (ref 10–24)
BUN: 22 mg/dL (ref 8–27)
Bilirubin Total: 0.9 mg/dL (ref 0.0–1.2)
CO2: 23 mmol/L (ref 18–29)
Calcium: 9.2 mg/dL (ref 8.6–10.2)
Chloride: 99 mmol/L (ref 96–106)
Creatinine, Ser: 1.16 mg/dL (ref 0.76–1.27)
GFR calc Af Amer: 71 mL/min/{1.73_m2} (ref >59)
GFR calc non Af Amer: 61 mL/min/{1.73_m2} (ref >59)
Globulin, Total: 2.4 g/dL (ref 1.5–4.5)
Glucose: 121 mg/dL — ABNORMAL HIGH (ref 65–99)
Potassium: 4.7 mmol/L (ref 3.5–5.2)
Sodium: 139 mmol/L (ref 134–144)
Total Protein: 6.7 g/dL (ref 6.0–8.5)

## 2015-09-06 LAB — URINALYSIS
Bilirubin, UA: NEGATIVE
Glucose, UA: NEGATIVE
Ketones, UA: NEGATIVE
Nitrite, UA: NEGATIVE
RBC, UA: NEGATIVE
Specific Gravity, UA: 1.02 (ref 1.005–1.030)
Urobilinogen, Ur: 0.2 mg/dL (ref 0.2–1.0)
pH, UA: 7.5 (ref 5.0–7.5)

## 2015-09-06 LAB — URINALYSIS, MICROSCOPIC ONLY
RBC, UA: NONE SEEN /hpf (ref 0–?)
WBC, UA: >30 /hpf — AB (ref 0–?)

## 2015-09-06 LAB — BAYER DCA HB A1C WAIVED: HB A1C (BAYER DCA - WAIVED): 6.9 % (ref <7.0)

## 2015-09-06 NOTE — Patient Instructions (Signed)

## 2015-09-06 NOTE — Progress Notes (Signed)
Subjective:  Patient ID: Jared Norman, male    DOB: 06/25/1940  Age: 75 y.o. MRN: [id number]  CC: Diabetes; Hypertension; and Hyperlipidemia   HPI Jared Norman presents for  follow-up of hypertension. Patient has no history of headache chest pain or shortness of breath or recent cough. Patient also denies symptoms of TIA such as numbness weakness lateralizing. Patient checks  blood pressure at home and has not had any elevated readings recently. Patient denies side effects from his medication. States taking it regularly.  Patient also  in for follow-up of elevated cholesterol. Doing well without complaints on current medication. Denies side effects of statin including myalgia and arthralgia and nausea. Also in today for liver function testing. Currently no chest pain, shortness of breath or other cardiovascular related symptoms noted.  Follow-up of diabetes. Patient does check blood sugar at home. Readings run between 90  and 120 Patient denies symptoms such as polyuria, polydipsia, excessive hunger, nausea No significant hypoglycemic spells noted. Not staying on diet. Expresses liking for sweets, potatoes, corn. Questions about carb containing foods discussed. Wife says he doesn'tlike/eat green vegetables. Medications as noted below. Taking them regularly without complication/adverse reaction being reported today.    History Jared Norman has a past medical history of BPH (benign prostatic hyperplasia); Vitamin D deficiency; Diabetes mellitus without complication ([place]); Hyperlipidemia; Hypertension; Kidney stones; and Cataract.   He has past surgical history that includes right foot surgery (Right, 2008); Cystoscopy with retrograde pyelogram, ureteroscopy and stent placement (Left, 12/20/2013); and Eye surgery.   His family history includes Cancer in his father; Hypertension in his brother.He reports that he has never smoked. He does not have any smokeless tobacco history on file. He reports  that he does not drink alcohol or use illicit drugs.  Current Outpatient Prescriptions on File Prior to Visit  Medication Sig Dispense Refill  . amLODipine (NORVASC) 5 MG tablet TAKE ONE TABLET BY MOUTH ONCE DAILY 90 tablet 0  . aspirin EC 81 MG tablet Take 81 mg by mouth every Monday, Wednesday, and Friday.    . blood glucose meter kit and supplies KIT Dispense based on patient and insurance preference. Use up to four times daily as directed. (FOR ICD-9 250.00, 250.01). 1 each 3  . cholecalciferol (VITAMIN D) 1000 UNITS tablet Take 2,000 Units by mouth daily.    . finasteride (PROSCAR) 5 MG tablet TAKE ONE TABLET BY MOUTH ONCE DAILY 90 tablet 1  . lisinopril (PRINIVIL,ZESTRIL) 40 MG tablet TAKE ONE TABLET BY MOUTH ONCE DAILY 90 tablet 1  . magnesium gluconate (MAGONATE) 500 MG tablet Take 500 mg by mouth daily.    . metFORMIN (GLUCOPHAGE) 1000 MG tablet TAKE ONE TABLET BY MOUTH ONCE DAILY WITH BREAKFAST 90 tablet 0  . omeprazole (PRILOSEC) 40 MG capsule Take 1 capsule (40 mg total) by mouth daily. 90 capsule 3  . simvastatin (ZOCOR) 40 MG tablet TAKE ONE-HALF TABLET BY MOUTH AT BEDTIME 90 tablet 1  . ibuprofen (ADVIL,MOTRIN) 200 MG tablet Take 800 mg by mouth every 6 (six) hours as needed for moderate pain. Reported on 09/06/2015     No current facility-administered medications on file prior to visit.    ROS Review of Systems  Constitutional: Negative for fever, chills, diaphoresis and unexpected weight change.  HENT: Negative for congestion, hearing loss, rhinorrhea and sore throat.   Eyes: Positive for visual disturbance (DM retinopathy reported by eye doctor. Blindness runs in thhe family).  Respiratory: Negative for cough and shortness of breath.  Cardiovascular: Negative for chest pain.  Gastrointestinal: Negative for abdominal pain, diarrhea and constipation.  Genitourinary: Negative for dysuria and flank pain.  Musculoskeletal: Positive for arthralgias (left knee pain. Had  injection recently. Seeing ortho. Relief with meloxicam). Negative for joint swelling.  Skin: Negative for rash.  Neurological: Negative for dizziness and headaches.  Psychiatric/Behavioral: Negative for sleep disturbance and dysphoric mood.    Objective:  BP 146/72 mmHg  Pulse 66  Temp(Src) 97.9 F (36.6 C) (Oral)  Ht _0  (1.651 m)  Wt 179 lb 6.4 oz (81.375 kg)  BMI 29.85 kg/m2  SpO2 97%  BP Readings from Last 3 Encounters:  09/06/15 146/72  05/26/15 151/76  05/10/15 132/77    Wt Readings from Last 3 Encounters:  09/06/15 179 lb 6.4 oz (81.375 kg)  06/13/15 178 lb (80.74 kg)  05/26/15 178 lb 12.8 oz (81.103 kg)     Physical Exam  Constitutional: He is oriented to person, place, and time. He appears well-developed and well-nourished. No distress.  HENT:  Head: Normocephalic and atraumatic.  Right Ear: External ear normal.  Left Ear: External ear normal.  Nose: Nose normal.  Mouth/Throat: Oropharynx is clear and moist.  Eyes: Conjunctivae and EOM are normal. Pupils are equal, round, and reactive to light.  Neck: Normal range of motion. Neck supple. No thyromegaly present.  Cardiovascular: Normal rate, regular rhythm and normal heart sounds.   No murmur heard. Pulmonary/Chest: Effort normal and breath sounds normal. No respiratory distress. He has no wheezes. He has no rales.  Abdominal: Soft. Bowel sounds are normal. He exhibits no distension. There is no tenderness.  Lymphadenopathy:    He has no cervical adenopathy.  Neurological: He is alert and oriented to person, place, and time. He has normal reflexes.  Skin: Skin is warm and dry.  Psychiatric: He has a normal mood and affect. His behavior is normal. Judgment and thought content normal.    Lab Results  Component Value Date   HGBA1C 6.7 05/26/2015   HGBA1C 6.5 02/20/2015   HGBA1C 6.7 11/18/2014    Lab Results  Component Value Date   WBC 7.6 05/26/2015   HGB 14.4 11/18/2014   HCT 43.5 05/26/2015    PLT 221 05/26/2015   GLUCOSE 140* 05/26/2015   CHOL 134 05/26/2015   TRIG 117 05/26/2015   HDL 42 05/26/2015   LDLCALC 69 05/26/2015   ALT 25 05/26/2015   AST 23 05/26/2015   NA 139 05/26/2015   K 5.5* 05/26/2015   CL 99 05/26/2015   CREATININE 1.15 05/26/2015   BUN 17 05/26/2015   CO2 25 05/26/2015   TSH 2.190 11/18/2014   HGBA1C 6.7 05/26/2015    Mr Knee Left  Wo Contrast  06/13/2015  CLINICAL DATA:  Anterior left knee pain for 1 month. No known injury. EXAM: MRI OF THE LEFT KNEE WITHOUT CONTRAST TECHNIQUE: Multiplanar, multisequence MR imaging of the knee was performed. No intravenous contrast was administered. COMPARISON:  Plain films left knee 05/10/2015. FINDINGS: MENISCI Medial meniscus: Degenerative signal is seen in the posterior horn and body of the medial meniscus but no tear is identified. The anterior horn of the lateral meniscus is not identified consistent with degenerative maceration. Lateral meniscus: Fraying along the free edge of the body is seen. Marked degenerative signal is present in the posterior horn. LIGAMENTS Cruciates:  Intact. Collaterals:  Intact. CARTILAGE Patellofemoral: Hyaline cartilage along the lateral patellar facet and femoral trochlea is completely denuded. The patella is laterally subluxed and rides on  the lateral femoral trochlea. Medial: Extensive hyaline cartilage thinning is present with associated joint space narrowing. Lateral:  Degenerated and irregular throughout. Joint:  Small joint effusion. Popliteal Fossa: Baker's cyst with septations measures 2.6 cm AP by 1.5 cm transverse by 3.4 cm craniocaudal. Extensor Mechanism:  Intact. Bones:  Prominent tricompartmental osteophytosis is seen. IMPRESSION: Dominant finding is advanced tricompartmental osteoarthritis. Degenerative maceration anterior horn lateral meniscus. Septated Baker's cyst. Electronically Signed   By: Inge Rise M.D.   On: 06/13/2015 16:04    Assessment & Plan:   Idrissa was  seen today for diabetes, hypertension and hyperlipidemia.  Diagnoses and all orders for this visit:  Diabetes mellitus without complication ([place]) -     POCT glycosylated hemoglobin (Hb A1C) -     CMP14+EGFR -     Microalbumin / creatinine urine ratio -     Urinalysis  Essential hypertension -     POCT glycosylated hemoglobin (Hb A1C) -     CMP14+EGFR -     Microalbumin / creatinine urine ratio -     Urinalysis   I am having Mr. Gaffin maintain his magnesium gluconate, ibuprofen, cholecalciferol, aspirin EC, omeprazole, lisinopril, simvastatin, blood glucose meter kit and supplies, finasteride, metFORMIN, amLODipine, and meloxicam.  Meds ordered this encounter  Medications  . meloxicam (MOBIC) 15 MG tablet    Sig: Take 1 tablet by mouth daily.   Discussed in detail dietary carbs, complex vs. Simple. Several examples given along with  Handout. Recommended he see DM educator. He will let me know.  Follow-up: Return in about 3 months (around 12/06/2015) for diabetes.  Claretta Fraise, M.D.

## 2015-09-06 NOTE — Addendum Note (Signed)
Addended by: Marin Olp on: 09/06/2015 11:32 AM   Modules accepted: Orders

## 2015-09-06 NOTE — Addendum Note (Signed)
Addended by: Marin Olp on: 09/06/2015 11:44 AM   Modules accepted: Orders

## 2015-09-07 LAB — URINE CULTURE: Organism ID, Bacteria: NO GROWTH

## 2015-09-07 LAB — MICROALBUMIN / CREATININE URINE RATIO
Creatinine, Urine: 132.4 mg/dL
MICROALB/CREAT RATIO: 30.4 mg/g creat — ABNORMAL HIGH (ref 0.0–30.0)
Microalbumin, Urine: 40.2 ug/mL

## 2015-09-18 DIAGNOSIS — M1712 Unilateral primary osteoarthritis, left knee: Secondary | ICD-10-CM | POA: Diagnosis not present

## 2015-09-25 ENCOUNTER — Encounter (INDEPENDENT_AMBULATORY_CARE_PROVIDER_SITE_OTHER): Payer: Medicare HMO | Admitting: Ophthalmology

## 2015-09-25 DIAGNOSIS — H43813 Vitreous degeneration, bilateral: Secondary | ICD-10-CM | POA: Diagnosis not present

## 2015-09-25 DIAGNOSIS — E113513 Type 2 diabetes mellitus with proliferative diabetic retinopathy with macular edema, bilateral: Secondary | ICD-10-CM

## 2015-09-25 DIAGNOSIS — H35033 Hypertensive retinopathy, bilateral: Secondary | ICD-10-CM | POA: Diagnosis not present

## 2015-09-25 DIAGNOSIS — E11311 Type 2 diabetes mellitus with unspecified diabetic retinopathy with macular edema: Secondary | ICD-10-CM

## 2015-09-25 DIAGNOSIS — I10 Essential (primary) hypertension: Secondary | ICD-10-CM

## 2015-09-27 ENCOUNTER — Other Ambulatory Visit: Payer: Self-pay | Admitting: Family Medicine

## 2015-10-13 ENCOUNTER — Telehealth: Payer: Self-pay | Admitting: Family Medicine

## 2015-10-13 NOTE — Telephone Encounter (Signed)
Spoke with pts wife

## 2015-10-23 DIAGNOSIS — D225 Melanocytic nevi of trunk: Secondary | ICD-10-CM | POA: Diagnosis not present

## 2015-10-23 DIAGNOSIS — L820 Inflamed seborrheic keratosis: Secondary | ICD-10-CM | POA: Diagnosis not present

## 2015-10-24 ENCOUNTER — Other Ambulatory Visit: Payer: Self-pay | Admitting: Family Medicine

## 2015-10-26 ENCOUNTER — Ambulatory Visit (INDEPENDENT_AMBULATORY_CARE_PROVIDER_SITE_OTHER): Payer: Medicare HMO | Admitting: Family Medicine

## 2015-10-26 VITALS — BP 147/75 | HR 59 | Temp 97.3°F | Ht 65.0 in | Wt 170.6 lb

## 2015-10-26 DIAGNOSIS — M7021 Olecranon bursitis, right elbow: Secondary | ICD-10-CM | POA: Diagnosis not present

## 2015-10-26 NOTE — Patient Instructions (Addendum)
Great to see you!  It looks like increased fluid in the olecranon bursa on that side, sometimes this can become painful. Without pain I would not recommend draining today.   I would recommend showing it to Dr. Rex Kras for his opinion as well

## 2015-10-26 NOTE — Progress Notes (Signed)
   HPI  Patient presents today here with right elbow swelling.  Patient noticed it a few days ago, he has no pain in the elbow. No History of injury. He states that he is going on a trip in 2 days to [place] in several states.  He hasn't 1 with his orthopedic surgeon tomorrow to get a knee injection.  He denies fever, chills, sweats, shortness of breath, chest pain. He is tolerating food and fluids normally.  PMH: Smoking status noted ROS: Per HPI  Objective: BP 147/75 mmHg  Pulse 59  Temp(Src) 97.3 F (36.3 C) (Oral)  Ht 5\' 5"  (1.651 m)  Wt 170 lb 9.6 oz (77.384 kg)  BMI 28.39 kg/m2 Gen: NAD, alert, cooperative with exam HEENT: NCAT CV: RRR, good S1/S2, no murmur Resp: CTABL, no wheezes, non-labored Ext: No edema, warm Neuro: Alert and oriented, No gross deficits  Musculoskeletal Right elbow with soft fluctuant swelling right over the posterior elbow, no tenderness to palpation, no erythema, no induration  Assessment and plan:  # Olecranon bursitis Nonpainful, recommended continue NSAIDs, ice, follow-up with orthopedic surgery tomorrow. At this point I would not recommend drainage and steroid injection, I would appreciate his orthopedic surgeons opinion.   Laroy Apple, MD [facility] 10/26/2015, 10:41 AM

## 2015-10-27 DIAGNOSIS — M7021 Olecranon bursitis, right elbow: Secondary | ICD-10-CM | POA: Diagnosis not present

## 2015-10-27 DIAGNOSIS — M1712 Unilateral primary osteoarthritis, left knee: Secondary | ICD-10-CM | POA: Diagnosis not present

## 2015-11-12 ENCOUNTER — Other Ambulatory Visit: Payer: Self-pay | Admitting: Family Medicine

## 2015-11-20 ENCOUNTER — Encounter (INDEPENDENT_AMBULATORY_CARE_PROVIDER_SITE_OTHER): Payer: Medicare HMO | Admitting: Ophthalmology

## 2015-11-20 DIAGNOSIS — E11311 Type 2 diabetes mellitus with unspecified diabetic retinopathy with macular edema: Secondary | ICD-10-CM | POA: Diagnosis not present

## 2015-11-20 DIAGNOSIS — E113511 Type 2 diabetes mellitus with proliferative diabetic retinopathy with macular edema, right eye: Secondary | ICD-10-CM

## 2015-11-20 DIAGNOSIS — I10 Essential (primary) hypertension: Secondary | ICD-10-CM | POA: Diagnosis not present

## 2015-11-20 DIAGNOSIS — E113592 Type 2 diabetes mellitus with proliferative diabetic retinopathy without macular edema, left eye: Secondary | ICD-10-CM

## 2015-11-20 DIAGNOSIS — H35033 Hypertensive retinopathy, bilateral: Secondary | ICD-10-CM | POA: Diagnosis not present

## 2015-11-20 DIAGNOSIS — H43813 Vitreous degeneration, bilateral: Secondary | ICD-10-CM | POA: Diagnosis not present

## 2015-11-24 ENCOUNTER — Other Ambulatory Visit: Payer: Self-pay | Admitting: Family Medicine

## 2015-12-12 ENCOUNTER — Ambulatory Visit: Payer: Medicare HMO | Admitting: Family Medicine

## 2015-12-25 ENCOUNTER — Other Ambulatory Visit: Payer: Self-pay | Admitting: Family Medicine

## 2016-01-01 ENCOUNTER — Encounter (INDEPENDENT_AMBULATORY_CARE_PROVIDER_SITE_OTHER): Payer: Medicare HMO | Admitting: Ophthalmology

## 2016-01-01 DIAGNOSIS — H35033 Hypertensive retinopathy, bilateral: Secondary | ICD-10-CM

## 2016-01-01 DIAGNOSIS — E113513 Type 2 diabetes mellitus with proliferative diabetic retinopathy with macular edema, bilateral: Secondary | ICD-10-CM

## 2016-01-01 DIAGNOSIS — H43813 Vitreous degeneration, bilateral: Secondary | ICD-10-CM | POA: Diagnosis not present

## 2016-01-01 DIAGNOSIS — I10 Essential (primary) hypertension: Secondary | ICD-10-CM

## 2016-01-01 DIAGNOSIS — E11311 Type 2 diabetes mellitus with unspecified diabetic retinopathy with macular edema: Secondary | ICD-10-CM

## 2016-01-10 ENCOUNTER — Other Ambulatory Visit: Payer: Self-pay | Admitting: Family Medicine

## 2016-02-12 ENCOUNTER — Telehealth: Payer: Self-pay | Admitting: Family Medicine

## 2016-02-12 ENCOUNTER — Encounter (INDEPENDENT_AMBULATORY_CARE_PROVIDER_SITE_OTHER): Payer: Medicare HMO | Admitting: Ophthalmology

## 2016-02-12 ENCOUNTER — Other Ambulatory Visit: Payer: Self-pay | Admitting: Family Medicine

## 2016-02-12 DIAGNOSIS — I10 Essential (primary) hypertension: Secondary | ICD-10-CM | POA: Diagnosis not present

## 2016-02-12 DIAGNOSIS — H35033 Hypertensive retinopathy, bilateral: Secondary | ICD-10-CM | POA: Diagnosis not present

## 2016-02-12 DIAGNOSIS — E119 Type 2 diabetes mellitus without complications: Secondary | ICD-10-CM

## 2016-02-12 DIAGNOSIS — E11311 Type 2 diabetes mellitus with unspecified diabetic retinopathy with macular edema: Secondary | ICD-10-CM | POA: Diagnosis not present

## 2016-02-12 DIAGNOSIS — E113591 Type 2 diabetes mellitus with proliferative diabetic retinopathy without macular edema, right eye: Secondary | ICD-10-CM | POA: Diagnosis not present

## 2016-02-12 DIAGNOSIS — E782 Mixed hyperlipidemia: Secondary | ICD-10-CM

## 2016-02-12 DIAGNOSIS — H43813 Vitreous degeneration, bilateral: Secondary | ICD-10-CM | POA: Diagnosis not present

## 2016-02-15 ENCOUNTER — Encounter: Payer: Self-pay | Admitting: Family Medicine

## 2016-02-15 ENCOUNTER — Ambulatory Visit (INDEPENDENT_AMBULATORY_CARE_PROVIDER_SITE_OTHER): Payer: Medicare HMO | Admitting: Family Medicine

## 2016-02-15 VITALS — BP 126/74 | HR 75 | Temp 97.3°F | Ht 65.0 in | Wt 177.0 lb

## 2016-02-15 DIAGNOSIS — E1142 Type 2 diabetes mellitus with diabetic polyneuropathy: Secondary | ICD-10-CM

## 2016-02-15 DIAGNOSIS — N401 Enlarged prostate with lower urinary tract symptoms: Secondary | ICD-10-CM

## 2016-02-15 DIAGNOSIS — E119 Type 2 diabetes mellitus without complications: Secondary | ICD-10-CM

## 2016-02-15 DIAGNOSIS — Z23 Encounter for immunization: Secondary | ICD-10-CM | POA: Diagnosis not present

## 2016-02-15 DIAGNOSIS — I10 Essential (primary) hypertension: Secondary | ICD-10-CM

## 2016-02-15 DIAGNOSIS — E782 Mixed hyperlipidemia: Secondary | ICD-10-CM | POA: Diagnosis not present

## 2016-02-15 LAB — BAYER DCA HB A1C WAIVED: HB A1C (BAYER DCA - WAIVED): 6.4 % (ref <7.0)

## 2016-02-15 MED ORDER — OMEPRAZOLE 40 MG PO CPDR: 40.0000 mg | DELAYED_RELEASE_CAPSULE | Freq: Every day | ORAL | 0 refills | Status: DC

## 2016-02-15 MED ORDER — AMLODIPINE BESYLATE 5 MG PO TABS: 5.0000 mg | ORAL_TABLET | Freq: Every day | ORAL | 0 refills | Status: DC

## 2016-02-15 MED ORDER — METFORMIN HCL 1000 MG PO TABS: ORAL_TABLET | ORAL | 1 refills | Status: DC

## 2016-02-15 MED ORDER — EASY TOUCH LANCETS 26G MISC: 3 refills | Status: DC

## 2016-02-15 MED ORDER — GLUCOSE BLOOD VI STRP: ORAL_STRIP | 12 refills | Status: DC

## 2016-02-15 MED ORDER — FINASTERIDE 5 MG PO TABS: 5.0000 mg | ORAL_TABLET | Freq: Every day | ORAL | 0 refills | Status: DC

## 2016-02-15 MED ORDER — LISINOPRIL 40 MG PO TABS: 40.0000 mg | ORAL_TABLET | Freq: Every day | ORAL | 0 refills | Status: DC

## 2016-02-15 MED ORDER — SIMVASTATIN 40 MG PO TABS: 20.0000 mg | ORAL_TABLET | Freq: Every day | ORAL | 1 refills | Status: DC

## 2016-02-15 NOTE — Addendum Note (Signed)
Addended by: Marylin Crosby on: 02/15/2016 09:24 AM   Modules accepted: Orders

## 2016-02-15 NOTE — Progress Notes (Signed)
Subjective:  Patient ID: Jared Norman, male    DOB: [birth year]  Age: 75 y.o. MRN: [id number]  CC: Diabetes   HPI QUINTAN SALDIVAR presents for  follow-up of hypertension. Patient has no history of headache chest pain or shortness of breath or recent cough. Patient also denies symptoms of TIA such as numbness weakness lateralizing. Patient checks  blood pressure at home and has not had any elevated readings recently. Patient denies side effects from his medication. States taking it regularly.  Patient also  in for follow-up of elevated cholesterol. Doing well without complaints on current medication. Denies side effects of statin including myalgia and arthralgia and nausea. Also in today for liver function testing. Currently no chest pain, shortness of breath or other cardiovascular related symptoms noted.  Follow-up of diabetes. Patient does check blood sugar at home. Readings run between 115 and 150 Patient denies symptoms such as polyuria, polydipsia, excessive hunger, nausea No significant hypoglycemic spells noted. Medications as noted below. Taking them regularly without complication/adverse reaction being reported today.    History Tyheem has a past medical history of BPH (benign prostatic hyperplasia); Cataract; Diabetes mellitus without complication ([place]); Hyperlipidemia; Hypertension; Kidney stones; and Vitamin D deficiency.   He has a past surgical history that includes right foot surgery (Right, 2008); Cystoscopy with retrograde pyelogram, ureteroscopy and stent placement (Left, 12/20/2013); and Eye surgery.   His family history includes Cancer in his father; Hypertension in his brother.He reports that he has never smoked. He has never used smokeless tobacco. He reports that he does not drink alcohol or use drugs.  Current Outpatient Prescriptions on File Prior to Visit  Medication Sig Dispense Refill  . amLODipine (NORVASC) 5 MG tablet TAKE ONE TABLET BY MOUTH ONCE DAILY 90  tablet 0  . aspirin EC 81 MG tablet Take 81 mg by mouth every Monday, Wednesday, and Friday.    . blood glucose meter kit and supplies KIT Dispense based on patient and insurance preference. Use up to four times daily as directed. (FOR ICD-9 250.00, 250.01). 1 each 3  . cholecalciferol (VITAMIN D) 1000 UNITS tablet Take 2,000 Units by mouth daily.    . finasteride (PROSCAR) 5 MG tablet TAKE ONE TABLET BY MOUTH ONCE DAILY 90 tablet 0  . lisinopril (PRINIVIL,ZESTRIL) 40 MG tablet TAKE ONE TABLET BY MOUTH ONCE DAILY 90 tablet 0  . lisinopril (PRINIVIL,ZESTRIL) 40 MG tablet TAKE ONE TABLET BY MOUTH ONCE DAILY 90 tablet 0  . magnesium gluconate (MAGONATE) 500 MG tablet Take 500 mg by mouth daily.    . meloxicam (MOBIC) 15 MG tablet Take 1 tablet by mouth daily.    . metFORMIN (GLUCOPHAGE) 1000 MG tablet TAKE ONE TABLET BY MOUTH ONCE DAILY WITH BREAKFAST 90 tablet 1  . omeprazole (PRILOSEC) 40 MG capsule TAKE ONE CAPSULE BY MOUTH ONCE DAILY 90 capsule 0  . simvastatin (ZOCOR) 40 MG tablet TAKE ONE-HALF TABLET BY MOUTH AT BEDTIME 90 tablet 1   No current facility-administered medications on file prior to visit.     ROS Review of Systems  Constitutional: Negative for chills, diaphoresis, fever and unexpected weight change.  HENT: Negative for congestion, hearing loss, rhinorrhea and sore throat.   Eyes: Negative for visual disturbance.  Respiratory: Negative for cough and shortness of breath.   Cardiovascular: Negative for chest pain.  Gastrointestinal: Negative for abdominal pain, constipation and diarrhea.  Genitourinary: Positive for enuresis, frequency and urgency. Negative for difficulty urinating, dysuria and flank pain.  Musculoskeletal: Negative for  arthralgias and joint swelling.  Skin: Negative for rash.  Neurological: Negative for dizziness and headaches.  Psychiatric/Behavioral: Negative for dysphoric mood and sleep disturbance.    Objective:  BP 126/74   Pulse 75   Temp 97.3 F  (36.3 C) (Oral)   Ht '5\' 5"'$  (1.651 m)   Wt 177 lb (80.3 kg)   BMI 29.45 kg/m   BP Readings from Last 3 Encounters:  02/15/16 126/74  10/26/15 (!) 147/75  09/06/15 (!) 146/72    Wt Readings from Last 3 Encounters:  02/15/16 177 lb (80.3 kg)  10/26/15 170 lb 9.6 oz (77.4 kg)  09/06/15 179 lb 6.4 oz (81.4 kg)     Physical Exam  Constitutional: He is oriented to person, place, and time. He appears well-developed and well-nourished. No distress.  HENT:  Head: Normocephalic and atraumatic.  Right Ear: External ear normal.  Left Ear: External ear normal.  Nose: Nose normal.  Mouth/Throat: Oropharynx is clear and moist.  Eyes: Conjunctivae and EOM are normal. Pupils are equal, round, and reactive to light.  Neck: Normal range of motion. Neck supple. No thyromegaly present.  Cardiovascular: Normal rate, regular rhythm and normal heart sounds.   No murmur heard. Pulmonary/Chest: Effort normal and breath sounds normal. No respiratory distress. He has no wheezes. He has no rales.  Abdominal: Soft. Bowel sounds are normal. He exhibits no distension. There is no tenderness.  Lymphadenopathy:    He has no cervical adenopathy.  Neurological: He is alert and oriented to person, place, and time. He has normal reflexes.  Skin: Skin is warm and dry.  Psychiatric: He has a normal mood and affect. His behavior is normal. Judgment and thought content normal.    Lab Results  Component Value Date   HGBA1C 6.7 05/26/2015   HGBA1C 6.5 02/20/2015   HGBA1C 6.7 11/18/2014    Lab Results  Component Value Date   WBC 7.6 05/26/2015   HGB 14.4 11/18/2014   HCT 43.5 05/26/2015   PLT 221 05/26/2015   GLUCOSE 121 (H) 09/06/2015   CHOL 134 05/26/2015   TRIG 117 05/26/2015   HDL 42 05/26/2015   LDLCALC 69 05/26/2015   ALT 24 09/06/2015   AST 18 09/06/2015   NA 139 09/06/2015   K 4.7 09/06/2015   CL 99 09/06/2015   CREATININE 1.16 09/06/2015   BUN 22 09/06/2015   CO2 23 09/06/2015   TSH  2.190 11/18/2014   HGBA1C 6.7 05/26/2015   MICROALBUR negative 11/18/2014    Mr Knee Left  Wo Contrast  Result Date: 06/13/2015 CLINICAL DATA:  Anterior left knee pain for 1 month. No known injury. EXAM: MRI OF THE LEFT KNEE WITHOUT CONTRAST TECHNIQUE: Multiplanar, multisequence MR imaging of the knee was performed. No intravenous contrast was administered. COMPARISON:  Plain films left knee 05/10/2015. FINDINGS: MENISCI Medial meniscus: Degenerative signal is seen in the posterior horn and body of the medial meniscus but no tear is identified. The anterior horn of the lateral meniscus is not identified consistent with degenerative maceration. Lateral meniscus: Fraying along the free edge of the body is seen. Marked degenerative signal is present in the posterior horn. LIGAMENTS Cruciates:  Intact. Collaterals:  Intact. CARTILAGE Patellofemoral: Hyaline cartilage along the lateral patellar facet and femoral trochlea is completely denuded. The patella is laterally subluxed and rides on the lateral femoral trochlea. Medial: Extensive hyaline cartilage thinning is present with associated joint space narrowing. Lateral:  Degenerated and irregular throughout. Joint:  Small joint effusion. Popliteal Fossa:  Baker's cyst with septations measures 2.6 cm AP by 1.5 cm transverse by 3.4 cm craniocaudal. Extensor Mechanism:  Intact. Bones:  Prominent tricompartmental osteophytosis is seen. IMPRESSION: Dominant finding is advanced tricompartmental osteoarthritis. Degenerative maceration anterior horn lateral meniscus. Septated Baker's cyst. Electronically Signed   By: Inge Rise M.D.   On: 06/13/2015 16:04    Assessment & Plan:   Kell was seen today for diabetes.  Diagnoses and all orders for this visit:  Diabetes mellitus without complication ([place]) -     Bayer DCA Hb A1c Waived -     CBC with Differential/Platelet -     CMP14+EGFR -     Microalbumin / creatinine urine ratio  Diabetic polyneuropathy  associated with type 2 diabetes mellitus ([facility]) -     CBC with Differential/Platelet -     CMP14+EGFR  Mixed hyperlipidemia -     CBC with Differential/Platelet -     CMP14+EGFR -     Lipid panel  Essential hypertension -     CBC with Differential/Platelet -     CMP14+EGFR  Benign prostatic hyperplasia with lower urinary tract symptoms, symptom details unspecified -     CBC with Differential/Platelet -     CMP14+EGFR -     Ambulatory referral to Urology   I am having Mr. Severe maintain his magnesium gluconate, cholecalciferol, aspirin EC, blood glucose meter kit and supplies, meloxicam, metFORMIN, lisinopril, lisinopril, finasteride, amLODipine, simvastatin, and omeprazole.  No orders of the defined types were placed in this encounter.    Follow-up: No Follow-up on file.  Claretta Fraise, M.D.

## 2016-02-16 DIAGNOSIS — R69 Illness, unspecified: Secondary | ICD-10-CM | POA: Diagnosis not present

## 2016-02-16 LAB — LIPID PANEL
Chol/HDL Ratio: 2.6 ratio units (ref 0.0–5.0)
Cholesterol, Total: 143 mg/dL (ref 100–199)
HDL: 56 mg/dL (ref >39)
LDL Calculated: 71 mg/dL (ref 0–99)
Triglycerides: 78 mg/dL (ref 0–149)
VLDL Cholesterol Cal: 16 mg/dL (ref 5–40)

## 2016-02-16 LAB — CBC WITH DIFFERENTIAL/PLATELET
Basophils Absolute: 0 10*3/uL (ref 0.0–0.2)
Basos: 0 %
EOS (ABSOLUTE): 0.1 10*3/uL (ref 0.0–0.4)
Eos: 1 %
Hematocrit: 42.3 % (ref 37.5–51.0)
Hemoglobin: 14.5 g/dL (ref 12.6–17.7)
Immature Grans (Abs): 0 10*3/uL (ref 0.0–0.1)
Immature Granulocytes: 0 %
Lymphocytes Absolute: 2.1 10*3/uL (ref 0.7–3.1)
Lymphs: 15 %
MCH: 32.1 pg (ref 26.6–33.0)
MCHC: 34.3 g/dL (ref 31.5–35.7)
MCV: 94 fL (ref 79–97)
Monocytes Absolute: 1.5 10*3/uL — ABNORMAL HIGH (ref 0.1–0.9)
Monocytes: 11 %
Neutrophils Absolute: 10.2 10*3/uL — ABNORMAL HIGH (ref 1.4–7.0)
Neutrophils: 73 %
Platelets: 247 10*3/uL (ref 150–379)
RBC: 4.52 x10E6/uL (ref 4.14–5.80)
RDW: 13.7 % (ref 12.3–15.4)
WBC: 13.9 10*3/uL — ABNORMAL HIGH (ref 3.4–10.8)

## 2016-02-16 LAB — CMP14+EGFR
ALT: 15 IU/L (ref 0–44)
AST: 14 IU/L (ref 0–40)
Albumin/Globulin Ratio: 1.8 (ref 1.2–2.2)
Albumin: 4.3 g/dL (ref 3.5–4.8)
Alkaline Phosphatase: 61 IU/L (ref 39–117)
BUN/Creatinine Ratio: 13 (ref 10–24)
BUN: 18 mg/dL (ref 8–27)
Bilirubin Total: 2 mg/dL — ABNORMAL HIGH (ref 0.0–1.2)
CO2: 26 mmol/L (ref 18–29)
Calcium: 9.2 mg/dL (ref 8.6–10.2)
Chloride: 98 mmol/L (ref 96–106)
Creatinine, Ser: 1.34 mg/dL — ABNORMAL HIGH (ref 0.76–1.27)
GFR calc Af Amer: 59 mL/min/{1.73_m2} — ABNORMAL LOW (ref >59)
GFR calc non Af Amer: 51 mL/min/{1.73_m2} — ABNORMAL LOW (ref >59)
Globulin, Total: 2.4 g/dL (ref 1.5–4.5)
Glucose: 110 mg/dL — ABNORMAL HIGH (ref 65–99)
Potassium: 4.9 mmol/L (ref 3.5–5.2)
Sodium: 140 mmol/L (ref 134–144)
Total Protein: 6.7 g/dL (ref 6.0–8.5)

## 2016-02-19 NOTE — Addendum Note (Signed)
Addended by: Shelbie Ammons on: 02/19/2016 02:55 PM   Modules accepted: Orders

## 2016-02-21 DIAGNOSIS — R69 Illness, unspecified: Secondary | ICD-10-CM | POA: Diagnosis not present

## 2016-03-11 DIAGNOSIS — N401 Enlarged prostate with lower urinary tract symptoms: Secondary | ICD-10-CM | POA: Diagnosis not present

## 2016-03-11 DIAGNOSIS — N3941 Urge incontinence: Secondary | ICD-10-CM | POA: Diagnosis not present

## 2016-03-11 DIAGNOSIS — N390 Urinary tract infection, site not specified: Secondary | ICD-10-CM | POA: Diagnosis not present

## 2016-03-25 ENCOUNTER — Encounter (INDEPENDENT_AMBULATORY_CARE_PROVIDER_SITE_OTHER): Payer: Medicare HMO | Admitting: Ophthalmology

## 2016-03-25 DIAGNOSIS — I10 Essential (primary) hypertension: Secondary | ICD-10-CM | POA: Diagnosis not present

## 2016-03-25 DIAGNOSIS — E11311 Type 2 diabetes mellitus with unspecified diabetic retinopathy with macular edema: Secondary | ICD-10-CM

## 2016-03-25 DIAGNOSIS — E113513 Type 2 diabetes mellitus with proliferative diabetic retinopathy with macular edema, bilateral: Secondary | ICD-10-CM

## 2016-03-25 DIAGNOSIS — H35033 Hypertensive retinopathy, bilateral: Secondary | ICD-10-CM

## 2016-03-25 DIAGNOSIS — H43813 Vitreous degeneration, bilateral: Secondary | ICD-10-CM

## 2016-04-23 DIAGNOSIS — H903 Sensorineural hearing loss, bilateral: Secondary | ICD-10-CM | POA: Diagnosis not present

## 2016-04-23 DIAGNOSIS — H6123 Impacted cerumen, bilateral: Secondary | ICD-10-CM | POA: Diagnosis not present

## 2016-05-16 ENCOUNTER — Encounter (INDEPENDENT_AMBULATORY_CARE_PROVIDER_SITE_OTHER): Payer: Medicare HMO | Admitting: Ophthalmology

## 2016-05-16 DIAGNOSIS — E113511 Type 2 diabetes mellitus with proliferative diabetic retinopathy with macular edema, right eye: Secondary | ICD-10-CM

## 2016-05-16 DIAGNOSIS — H35033 Hypertensive retinopathy, bilateral: Secondary | ICD-10-CM | POA: Diagnosis not present

## 2016-05-16 DIAGNOSIS — E11311 Type 2 diabetes mellitus with unspecified diabetic retinopathy with macular edema: Secondary | ICD-10-CM | POA: Diagnosis not present

## 2016-05-16 DIAGNOSIS — E113592 Type 2 diabetes mellitus with proliferative diabetic retinopathy without macular edema, left eye: Secondary | ICD-10-CM | POA: Diagnosis not present

## 2016-05-16 DIAGNOSIS — H43813 Vitreous degeneration, bilateral: Secondary | ICD-10-CM | POA: Diagnosis not present

## 2016-05-16 DIAGNOSIS — I10 Essential (primary) hypertension: Secondary | ICD-10-CM

## 2016-05-16 LAB — HM DIABETES EYE EXAM

## 2016-06-03 DIAGNOSIS — N401 Enlarged prostate with lower urinary tract symptoms: Secondary | ICD-10-CM | POA: Diagnosis not present

## 2016-06-19 DIAGNOSIS — N200 Calculus of kidney: Secondary | ICD-10-CM | POA: Diagnosis not present

## 2016-06-19 DIAGNOSIS — R3912 Poor urinary stream: Secondary | ICD-10-CM | POA: Diagnosis not present

## 2016-06-19 DIAGNOSIS — N401 Enlarged prostate with lower urinary tract symptoms: Secondary | ICD-10-CM | POA: Diagnosis not present

## 2016-06-19 DIAGNOSIS — N3941 Urge incontinence: Secondary | ICD-10-CM | POA: Diagnosis not present

## 2016-06-19 DIAGNOSIS — N390 Urinary tract infection, site not specified: Secondary | ICD-10-CM | POA: Diagnosis not present

## 2016-06-25 ENCOUNTER — Other Ambulatory Visit: Payer: Self-pay | Admitting: Family Medicine

## 2016-07-09 ENCOUNTER — Encounter (INDEPENDENT_AMBULATORY_CARE_PROVIDER_SITE_OTHER): Payer: Medicare HMO | Admitting: Ophthalmology

## 2016-07-09 DIAGNOSIS — H43813 Vitreous degeneration, bilateral: Secondary | ICD-10-CM

## 2016-07-09 DIAGNOSIS — E113592 Type 2 diabetes mellitus with proliferative diabetic retinopathy without macular edema, left eye: Secondary | ICD-10-CM

## 2016-07-09 DIAGNOSIS — H35033 Hypertensive retinopathy, bilateral: Secondary | ICD-10-CM | POA: Diagnosis not present

## 2016-07-09 DIAGNOSIS — I10 Essential (primary) hypertension: Secondary | ICD-10-CM | POA: Diagnosis not present

## 2016-07-09 DIAGNOSIS — E113511 Type 2 diabetes mellitus with proliferative diabetic retinopathy with macular edema, right eye: Secondary | ICD-10-CM | POA: Diagnosis not present

## 2016-07-09 DIAGNOSIS — E11311 Type 2 diabetes mellitus with unspecified diabetic retinopathy with macular edema: Secondary | ICD-10-CM | POA: Diagnosis not present

## 2016-07-11 ENCOUNTER — Ambulatory Visit (INDEPENDENT_AMBULATORY_CARE_PROVIDER_SITE_OTHER): Payer: Medicare HMO | Admitting: Nurse Practitioner

## 2016-07-11 ENCOUNTER — Encounter: Payer: Self-pay | Admitting: Nurse Practitioner

## 2016-07-11 ENCOUNTER — Ambulatory Visit (INDEPENDENT_AMBULATORY_CARE_PROVIDER_SITE_OTHER): Payer: Medicare HMO

## 2016-07-11 VITALS — BP 139/81 | HR 75 | Temp 97.4°F | Ht 65.0 in | Wt 183.0 lb

## 2016-07-11 DIAGNOSIS — M79671 Pain in right foot: Secondary | ICD-10-CM | POA: Diagnosis not present

## 2016-07-11 DIAGNOSIS — M7731 Calcaneal spur, right foot: Secondary | ICD-10-CM

## 2016-07-11 MED ORDER — MELOXICAM 15 MG PO TABS: 15.0000 mg | ORAL_TABLET | Freq: Every day | ORAL | 3 refills | Status: DC

## 2016-07-11 NOTE — Progress Notes (Signed)
   Subjective:    Patient ID: Jared Norman, male    DOB: 1941-02-21, 76 y.o.   MRN: [id number]  HPI Patient in today escorted by his wife with c/o right foot pain. Started 2 weeks ago. Rates pain 5/10. Pain is worse in mornings and gets some better throughout day. Excessive walking will increase pain.     Review of Systems  Constitutional: Negative.   Respiratory: Negative.   Cardiovascular: Negative.   Genitourinary: Negative.   Neurological: Negative.   Psychiatric/Behavioral: Negative.   All other systems reviewed and are negative.      Objective:   Physical Exam  Constitutional: He is oriented to person, place, and time. He appears well-developed and well-nourished. He appears distressed (mild).  Cardiovascular: Normal rate and regular rhythm.   Pulmonary/Chest: Effort normal and breath sounds normal.  Musculoskeletal:  Pain in right heel on palpation- no edema, no erythema  Neurological: He is alert and oriented to person, place, and time.  Skin: Skin is warm.  Psychiatric: He has a normal mood and affect. His behavior is normal. Judgment and thought content normal.   BP 139/81   Pulse 75   Temp 97.4 F (36.3 C) (Oral)   Ht 5\' 5"  (1.651 m)   Wt 183 lb (83 kg)   BMI 30.45 kg/m    Right foot xray- small right heel spur-Preliminary reading by Ronnald Collum, FNP  [facility]      Assessment & Plan:  1. Right foot pain - DG Foot Complete Right; Future  2. Heel spur, right Meds ordered this encounter  Medications  . meloxicam (MOBIC) 15 MG tablet    Sig: Take 1 tablet (15 mg total) by mouth daily.    Dispense:  30 tablet    Refill:  3    Order Specific Question:   Supervising Provider    Answer:   VINCENT, CAROL L [[id number]]   roll frozen bottle under foot bid Heel cups in shoes RTO prn  Mary-Margaret Hassell Done, FNP

## 2016-07-11 NOTE — Patient Instructions (Signed)
Heel Spur A heel spur is a bony growth that forms on the bottom of your heel bone (calcaneus). Heel spurs are common and do not always cause pain. However, heel spurs often cause inflammation in the strong band of tissue that runs underneath the bone of your foot (plantar fascia). When this happens, you may feel pain on the bottom of your foot, near your heel. What are the causes? The cause of heel spurs is not completely understood. They may be caused by pressure on the heel. Or, they may stem from the muscle attachments (tendons) near the spur pulling on the heel. What increases the risk? You may be at risk for a heel spur if you:  Are older than 40.  Are overweight.  Have wear and tear arthritis (osteoarthritis).  Have plantar fascia inflammation. What are the signs or symptoms? Some people have heel spurs but no symptoms. If you do have symptoms, they may include:  Pain in the bottom of your heel.  Pain that is worse when you first get out of bed.  Pain that gets worse after walking or standing. How is this diagnosed? Your health care provider may diagnose a heel spur based on your symptoms and a physical exam. You may also have an X-ray of your foot to check for a bony growth coming from the calcaneus. How is this treated? Treatment aims to relieve the pain from the heel spur. This may include:  Stretching exercises.  Losing weight.  Wearing specific shoes, inserts, or orthotics for comfort and support.  Wearing splints at night to properly position your feet.  Taking over-the-counter medicine to relieve pain.  Being treated with high-intensity sound waves to break up the heel spur (extracorporeal shock wave therapy).  Getting steroid injections in your heel to reduce swelling and ease pain.  Having surgery if your heel spur causes long-term (chronic) pain. Follow these instructions at home:  Take medicines only as directed by your health care provider.  Ask your  health care provider if you should use ice or cold packs on the painful areas of your heel or foot.  Avoid activities that cause you pain until you recover or as directed by your health care provider.  Stretch before exercising or being physically active.  Wear supportive shoes that fit well as directed by your health care provider. You might need to buy new shoes. Wearing old shoes or shoes that do not fit correctly may not provide the support that you need.  Lose weight if your health care provider thinks you should. This can relieve pressure on your foot that may be causing pain and discomfort. Contact a health care provider if:  Your pain continues or gets worse. This information is not intended to replace advice given to you by your health care provider. Make sure you discuss any questions you have with your health care provider. Document Released: 06/05/2005 Document Revised: 10/05/2015 Document Reviewed: 06/30/2013 Elsevier Interactive Patient Education  2017 [employer]  

## 2016-08-01 ENCOUNTER — Encounter: Payer: Self-pay | Admitting: Family Medicine

## 2016-08-01 ENCOUNTER — Ambulatory Visit (INDEPENDENT_AMBULATORY_CARE_PROVIDER_SITE_OTHER): Payer: Medicare HMO | Admitting: Family Medicine

## 2016-08-01 VITALS — BP 131/71 | HR 86 | Temp 97.3°F | Ht 65.0 in | Wt 187.0 lb

## 2016-08-01 DIAGNOSIS — E782 Mixed hyperlipidemia: Secondary | ICD-10-CM | POA: Diagnosis not present

## 2016-08-01 DIAGNOSIS — M1710 Unilateral primary osteoarthritis, unspecified knee: Secondary | ICD-10-CM | POA: Diagnosis not present

## 2016-08-01 DIAGNOSIS — E559 Vitamin D deficiency, unspecified: Secondary | ICD-10-CM

## 2016-08-01 DIAGNOSIS — N401 Enlarged prostate with lower urinary tract symptoms: Secondary | ICD-10-CM

## 2016-08-01 DIAGNOSIS — I10 Essential (primary) hypertension: Secondary | ICD-10-CM | POA: Diagnosis not present

## 2016-08-01 DIAGNOSIS — E119 Type 2 diabetes mellitus without complications: Secondary | ICD-10-CM

## 2016-08-01 DIAGNOSIS — R69 Illness, unspecified: Secondary | ICD-10-CM | POA: Diagnosis not present

## 2016-08-01 DIAGNOSIS — M1711 Unilateral primary osteoarthritis, right knee: Secondary | ICD-10-CM | POA: Diagnosis not present

## 2016-08-01 DIAGNOSIS — M1712 Unilateral primary osteoarthritis, left knee: Secondary | ICD-10-CM | POA: Diagnosis not present

## 2016-08-01 LAB — URINALYSIS
Bilirubin, UA: NEGATIVE
Leukocytes, UA: NEGATIVE
Nitrite, UA: NEGATIVE
Protein, UA: NEGATIVE
RBC, UA: NEGATIVE
Specific Gravity, UA: >1.03 — ABNORMAL HIGH (ref 1.005–1.030)
Urobilinogen, Ur: 0.2 mg/dL (ref 0.2–1.0)
pH, UA: 5 (ref 5.0–7.5)

## 2016-08-01 LAB — BAYER DCA HB A1C WAIVED: HB A1C (BAYER DCA - WAIVED): 7.4 % — ABNORMAL HIGH (ref <7.0)

## 2016-08-01 MED ORDER — BLOOD GLUCOSE MONITOR KIT: PACK | 3 refills | Status: DC

## 2016-08-01 NOTE — Progress Notes (Signed)
Subjective:  Patient ID: Jared Norman, male    DOB: 1941/01/25  Age: 76 y.o. MRN: [id number]  CC: Diabetes (pt here today for routine follow up on his diabetes and is also requesting a handicap form due to his knee and bone spur in his foot)   HPI Jared Norman presents for  follow-up of hypertension. Patient has no history of headache chest pain or shortness of breath or recent cough. Patient also denies symptoms of TIA such as numbness weakness lateralizing. Patient checks  blood pressure at home. Recent readings have been good Patient denies side effects from medication. States taking it regularly.  Patient also  in for follow-up of elevated cholesterol. Doing well without complaints on current medication. Denies side effects of statin including myalgia and arthralgia and nausea. Also in today for liver function testing. Currently no chest pain, shortness of breath or other cardiovascular related symptoms noted.  Follow-up of diabetes. Patient does check blood sugar at home. Readings run between 120 and 130 Patient denies symptoms such as polyuria, polydipsia, excessive hunger, nausea No significant hypoglycemic spells noted. Medications reviewed. Pt reports taking them regularly. Pt. denies complication/adverse reaction today.    History Jared Norman has a past medical history of BPH (benign prostatic hyperplasia); Cataract; Diabetes mellitus without complication ([place]); Hyperlipidemia; Hypertension; Kidney stones; and Vitamin D deficiency.   He has a past surgical history that includes right foot surgery (Right, 2008); Cystoscopy with retrograde pyelogram, ureteroscopy and stent placement (Left, 12/20/2013); and Eye surgery.   His family history includes Cancer in his father; Hypertension in his brother.He reports that he has never smoked. He has never used smokeless tobacco. He reports that he does not drink alcohol or use drugs.  Current Outpatient Prescriptions on File Prior to Visit    Medication Sig Dispense Refill  . amLODipine (NORVASC) 5 MG tablet Take 1 tablet (5 mg total) by mouth daily. 90 tablet 0  . aspirin EC 81 MG tablet Take 81 mg by mouth every Monday, Wednesday, and Friday.    . cholecalciferol (VITAMIN D) 1000 UNITS tablet Take 2,000 Units by mouth daily.    Marland Kitchen EASY TOUCH LANCETS 26G MISC Use to check BS daily DX E11.9 100 each 3  . finasteride (PROSCAR) 5 MG tablet TAKE ONE TABLET BY MOUTH ONCE DAILY 90 tablet 0  . glucose blood test strip Use as instructed 100 each 12  . lisinopril (PRINIVIL,ZESTRIL) 40 MG tablet Take 1 tablet (40 mg total) by mouth daily. 90 tablet 0  . magnesium gluconate (MAGONATE) 500 MG tablet Take 500 mg by mouth daily.    . meloxicam (MOBIC) 15 MG tablet Take 1 tablet (15 mg total) by mouth daily. 30 tablet 3  . metFORMIN (GLUCOPHAGE) 1000 MG tablet TAKE ONE TABLET BY MOUTH ONCE DAILY WITH BREAKFAST 90 tablet 1  . omeprazole (PRILOSEC) 40 MG capsule Take 1 capsule (40 mg total) by mouth daily. 90 capsule 0  . simvastatin (ZOCOR) 40 MG tablet Take 0.5 tablets (20 mg total) by mouth at bedtime. 90 tablet 1   No current facility-administered medications on file prior to visit.     ROS Review of Systems  Constitutional: Negative for chills, diaphoresis, fever and unexpected weight change.  HENT: Negative for congestion, hearing loss, rhinorrhea and sore throat.   Eyes: Negative for visual disturbance.  Respiratory: Negative for cough and shortness of breath.   Cardiovascular: Negative for chest pain.  Gastrointestinal: Negative for abdominal pain, constipation and diarrhea.  Genitourinary: Negative  for dysuria and flank pain.  Musculoskeletal: Negative for arthralgias and joint swelling.  Skin: Negative for rash.  Neurological: Negative for dizziness and headaches.  Psychiatric/Behavioral: Negative for dysphoric mood and sleep disturbance.    Objective:  BP 131/71   Pulse 86   Temp 97.3 F (36.3 C) (Oral)   Ht '5\' 5"'  (1.651  m)   Wt 187 lb (84.8 kg)   BMI 31.12 kg/m   BP Readings from Last 3 Encounters:  08/01/16 131/71  07/11/16 139/81  02/15/16 126/74    Wt Readings from Last 3 Encounters:  08/01/16 187 lb (84.8 kg)  07/11/16 183 lb (83 kg)  02/15/16 177 lb (80.3 kg)     Physical Exam  Constitutional: He is oriented to person, place, and time. He appears well-developed and well-nourished. No distress.  HENT:  Head: Normocephalic and atraumatic.  Right Ear: External ear normal.  Left Ear: External ear normal.  Nose: Nose normal.  Mouth/Throat: Oropharynx is clear and moist.  Eyes: Conjunctivae and EOM are normal. Pupils are equal, round, and reactive to light.  Neck: Normal range of motion. Neck supple. No thyromegaly present.  Cardiovascular: Normal rate, regular rhythm and normal heart sounds.   No murmur heard. Pulmonary/Chest: Effort normal and breath sounds normal. No respiratory distress. He has no wheezes. He has no rales.  Abdominal: Soft. Bowel sounds are normal. He exhibits no distension. There is no tenderness.  Lymphadenopathy:    He has no cervical adenopathy.  Neurological: He is alert and oriented to person, place, and time. He has normal reflexes.  Skin: Skin is warm and dry.  Psychiatric: He has a normal mood and affect. His behavior is normal. Judgment and thought content normal.        Assessment & Plan:   Jared Norman was seen today for diabetes.  Diagnoses and all orders for this visit:  Diabetes mellitus without complication ([place]) -     Microalbumin / creatinine urine ratio -     Urinalysis -     Bayer DCA Hb A1c Waived -     Lipid panel -     CMP14+EGFR -     CBC with Differential/Platelet  Mixed hyperlipidemia -     Lipid panel -     CMP14+EGFR  Essential hypertension -     CMP14+EGFR  Arthritis of knee -     CMP14+EGFR  Vitamin D deficiency -     CMP14+EGFR -     VITAMIN D 25 Hydroxy (Vit-D Deficiency, Fractures)  Benign prostatic hyperplasia with  lower urinary tract symptoms, symptom details unspecified -     CMP14+EGFR  Other orders -     blood glucose meter kit and supplies KIT; Dispense based on patient and insurance preference. Use up to four times daily as directed. (FOR ICD-9 250.00, 250.01).   I am having Mr. Wendorff maintain his magnesium gluconate, cholecalciferol, aspirin EC, glucose blood, EASY TOUCH LANCETS 26G, amLODipine, lisinopril, metFORMIN, omeprazole, simvastatin, finasteride, meloxicam, and blood glucose meter kit and supplies.  Meds ordered this encounter  Medications  . blood glucose meter kit and supplies KIT    Sig: Dispense based on patient and insurance preference. Use up to four times daily as directed. (FOR ICD-9 250.00, 250.01).    Dispense:  1 each    Refill:  3    Order Specific Question:   Number of strips    Answer:   200    Order Specific Question:   Number of lancets  Answer:   200   A steroid injection was performed at laterl joint line, left knee using 4 ml marcaine and 6 mg of Celestone. This was well tolerated.   Follow-up: Return in about 6 months (around 02/01/2017).  Claretta Fraise, M.D.

## 2016-08-02 LAB — CMP14+EGFR
ALT: 23 IU/L (ref 0–44)
AST: 17 IU/L (ref 0–40)
Albumin/Globulin Ratio: 1.9 (ref 1.2–2.2)
Albumin: 3.9 g/dL (ref 3.5–4.8)
Alkaline Phosphatase: 67 IU/L (ref 39–117)
BUN/Creatinine Ratio: 18 (ref 10–24)
BUN: 20 mg/dL (ref 8–27)
Bilirubin Total: 0.7 mg/dL (ref 0.0–1.2)
CO2: 24 mmol/L (ref 18–29)
Calcium: 9.4 mg/dL (ref 8.6–10.2)
Chloride: 99 mmol/L (ref 96–106)
Creatinine, Ser: 1.1 mg/dL (ref 0.76–1.27)
GFR calc Af Amer: 75 mL/min/{1.73_m2} (ref >59)
GFR calc non Af Amer: 65 mL/min/{1.73_m2} (ref >59)
Globulin, Total: 2.1 g/dL (ref 1.5–4.5)
Glucose: 142 mg/dL — ABNORMAL HIGH (ref 65–99)
Potassium: 4.6 mmol/L (ref 3.5–5.2)
Sodium: 139 mmol/L (ref 134–144)
Total Protein: 6 g/dL (ref 6.0–8.5)

## 2016-08-02 LAB — CBC WITH DIFFERENTIAL/PLATELET
Basophils Absolute: 0 10*3/uL (ref 0.0–0.2)
Basos: 1 %
EOS (ABSOLUTE): 0.2 10*3/uL (ref 0.0–0.4)
Eos: 3 %
Hematocrit: 41.9 % (ref 37.5–51.0)
Hemoglobin: 13.8 g/dL (ref 13.0–17.7)
Immature Grans (Abs): 0 10*3/uL (ref 0.0–0.1)
Immature Granulocytes: 0 %
Lymphocytes Absolute: 2.4 10*3/uL (ref 0.7–3.1)
Lymphs: 35 %
MCH: 29.9 pg (ref 26.6–33.0)
MCHC: 32.9 g/dL (ref 31.5–35.7)
MCV: 91 fL (ref 79–97)
Monocytes Absolute: 0.5 10*3/uL (ref 0.1–0.9)
Monocytes: 8 %
Neutrophils Absolute: 3.7 10*3/uL (ref 1.4–7.0)
Neutrophils: 53 %
Platelets: 185 10*3/uL (ref 150–379)
RBC: 4.62 x10E6/uL (ref 4.14–5.80)
RDW: 14.1 % (ref 12.3–15.4)
WBC: 6.9 10*3/uL (ref 3.4–10.8)

## 2016-08-02 LAB — LIPID PANEL
Chol/HDL Ratio: 3.4 ratio units (ref 0.0–5.0)
Cholesterol, Total: 140 mg/dL (ref 100–199)
HDL: 41 mg/dL (ref >39)
LDL Calculated: 56 mg/dL (ref 0–99)
Triglycerides: 217 mg/dL — ABNORMAL HIGH (ref 0–149)
VLDL Cholesterol Cal: 43 mg/dL — ABNORMAL HIGH (ref 5–40)

## 2016-08-02 LAB — MICROALBUMIN / CREATININE URINE RATIO
Creatinine, Urine: 226.9 mg/dL
Microalb/Creat Ratio: 4.3 mg/g creat (ref 0.0–30.0)
Microalbumin, Urine: 9.7 ug/mL

## 2016-08-02 LAB — VITAMIN D 25 HYDROXY (VIT D DEFICIENCY, FRACTURES): Vit D, 25-Hydroxy: 34.2 ng/mL (ref 30.0–100.0)

## 2016-08-03 DIAGNOSIS — R69 Illness, unspecified: Secondary | ICD-10-CM | POA: Diagnosis not present

## 2016-08-05 ENCOUNTER — Other Ambulatory Visit: Payer: Self-pay | Admitting: *Deleted

## 2016-08-05 MED ORDER — GLUCOSE BLOOD VI STRP: ORAL_STRIP | 12 refills | Status: DC

## 2016-08-19 ENCOUNTER — Other Ambulatory Visit: Payer: Self-pay | Admitting: Family Medicine

## 2016-08-27 ENCOUNTER — Encounter (INDEPENDENT_AMBULATORY_CARE_PROVIDER_SITE_OTHER): Payer: Medicare HMO | Admitting: Ophthalmology

## 2016-08-27 DIAGNOSIS — I10 Essential (primary) hypertension: Secondary | ICD-10-CM

## 2016-08-27 DIAGNOSIS — H35033 Hypertensive retinopathy, bilateral: Secondary | ICD-10-CM

## 2016-08-27 DIAGNOSIS — H43813 Vitreous degeneration, bilateral: Secondary | ICD-10-CM

## 2016-08-27 DIAGNOSIS — E11311 Type 2 diabetes mellitus with unspecified diabetic retinopathy with macular edema: Secondary | ICD-10-CM

## 2016-08-27 DIAGNOSIS — E113513 Type 2 diabetes mellitus with proliferative diabetic retinopathy with macular edema, bilateral: Secondary | ICD-10-CM

## 2016-08-28 IMAGING — MR MR KNEE*L* W/O CM
4 of 7 series · 13 of 40 positions shown · non-contrast
Comparison: Plain films left knee 05/10/2015.

CLINICAL DATA: Anterior left knee pain for 1 month. No known
injury.

EXAM:
MRI OF THE LEFT KNEE WITHOUT CONTRAST
TECHNIQUE: Multiplanar, multisequence MR imaging of the knee was performed. No
intravenous contrast was administered.

[Series 3: pdfs axial · axial · 3.0mm · 0.19mm/px · z∈[-35,+62]mm · 3 of 34 slices shown]
[im 7/34]
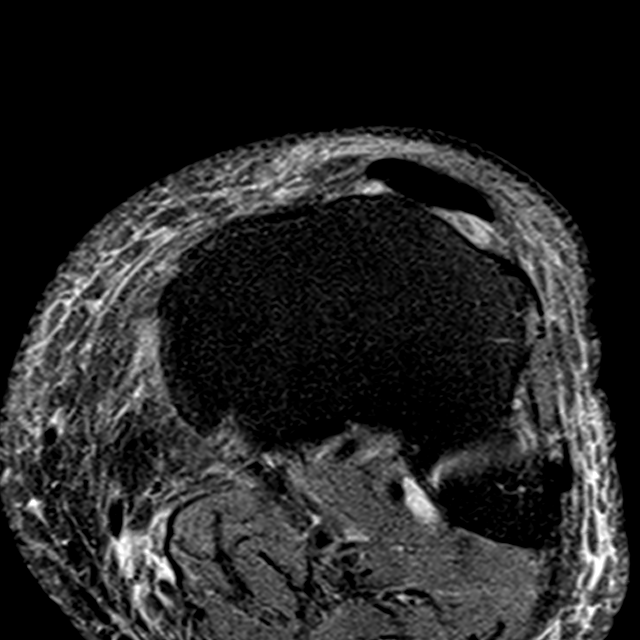
[im 20/34]
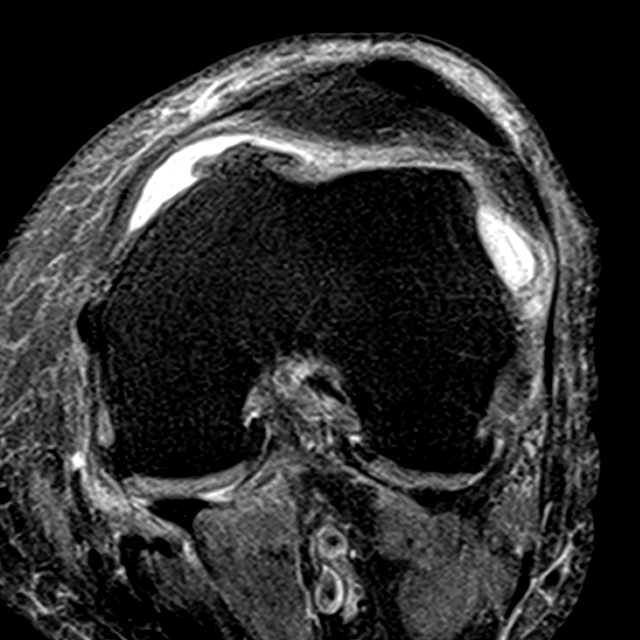
[im 34/34]
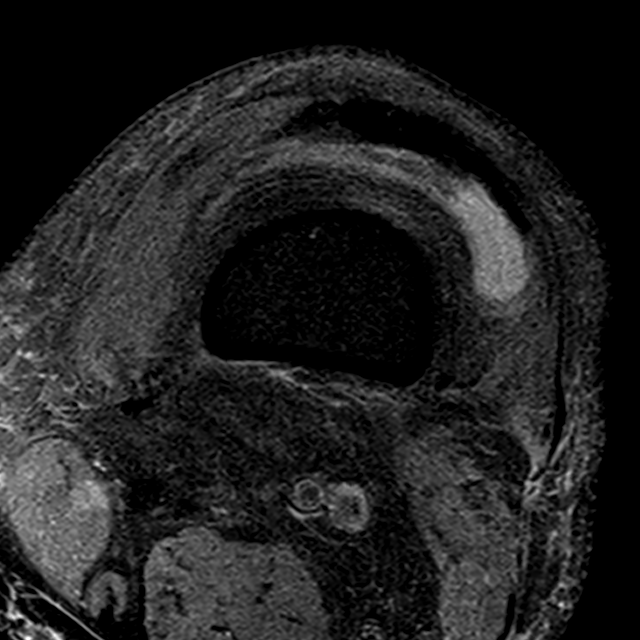

[Series 4: T1 · coronal · 3.0mm · 0.17mm/px · 4 of 28 slices shown]
[im 1/28]
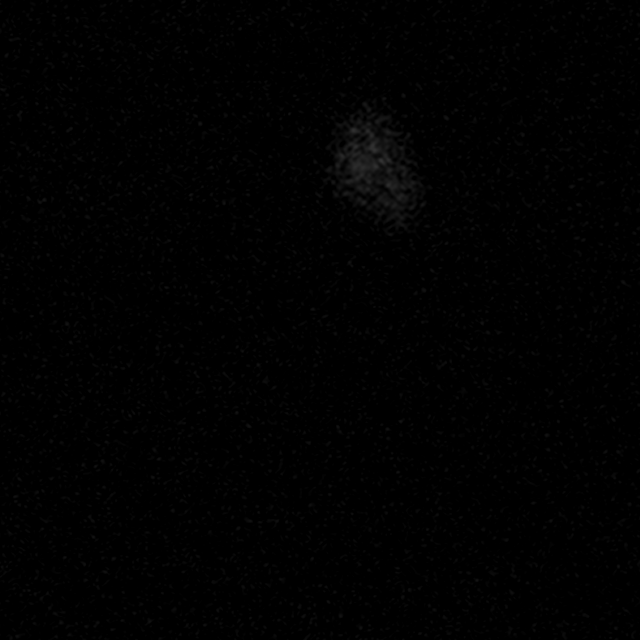
[im 6/28]
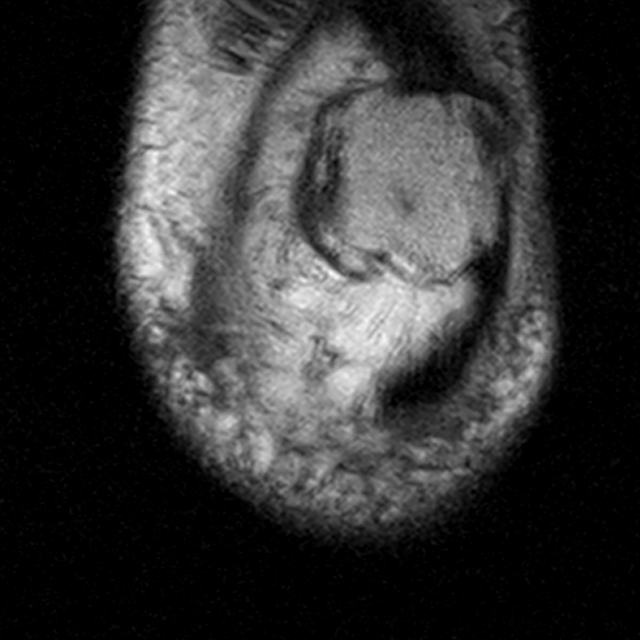
[im 17/28]
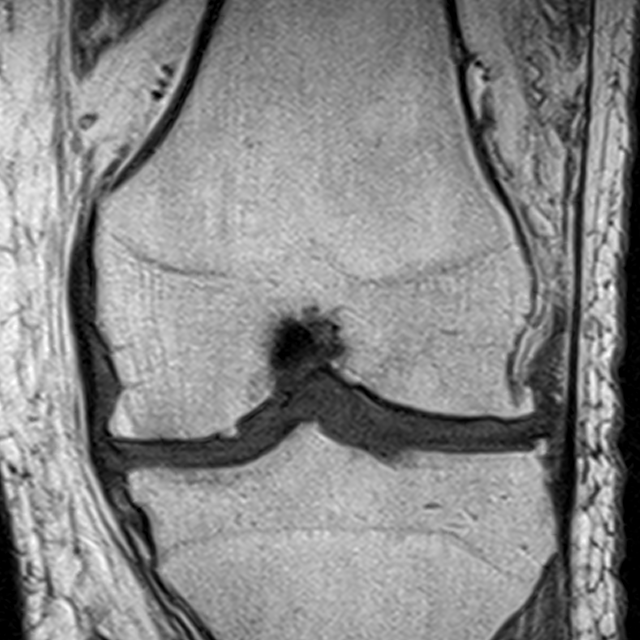
[im 28/28]
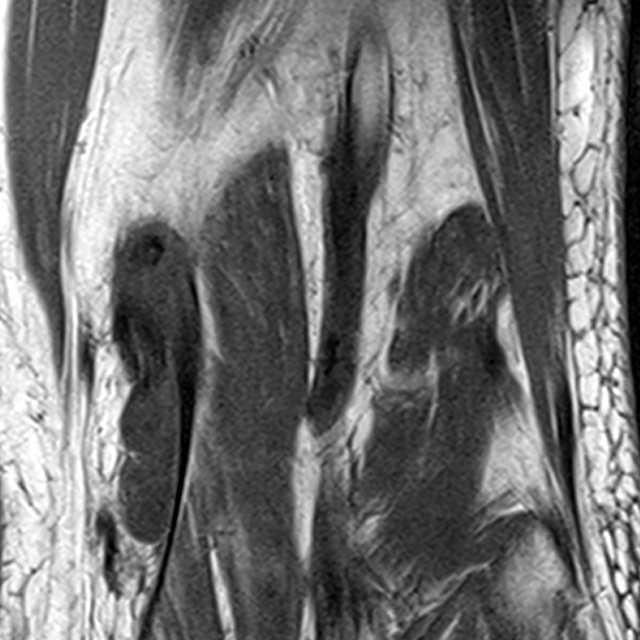

[Series 5: pdfs sag · sagittal · 3.0mm · 0.21mm/px · 3 of 29 slices shown]
[im 6/29]
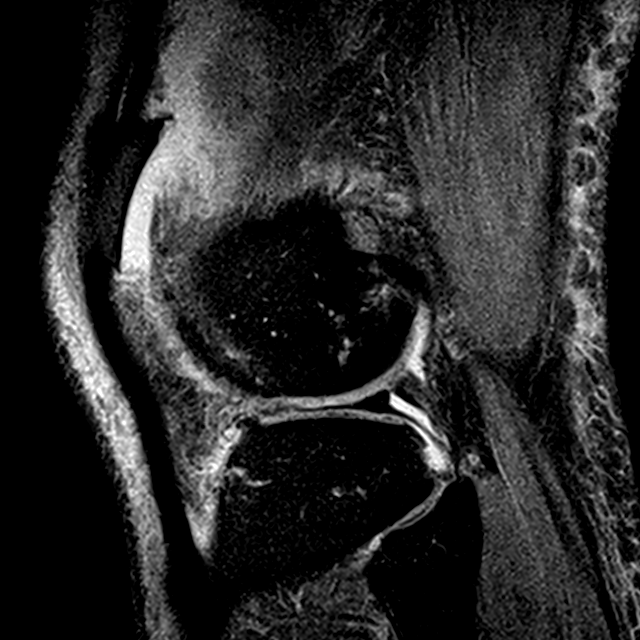
[im 17/29]
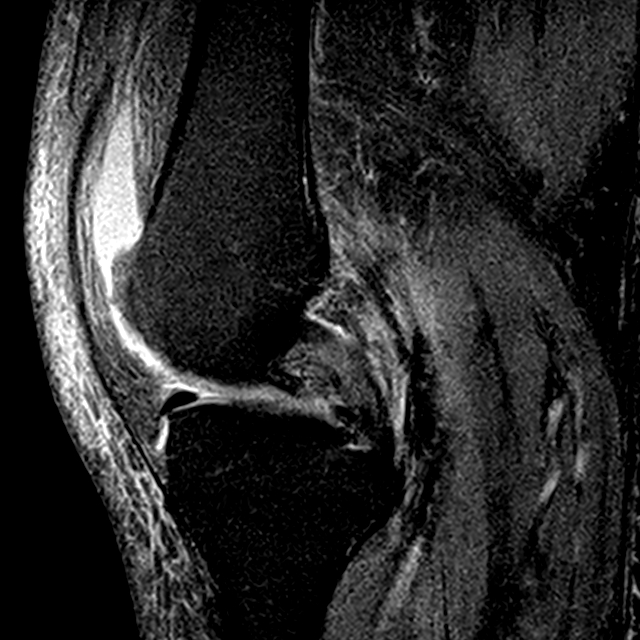
[im 29/29]
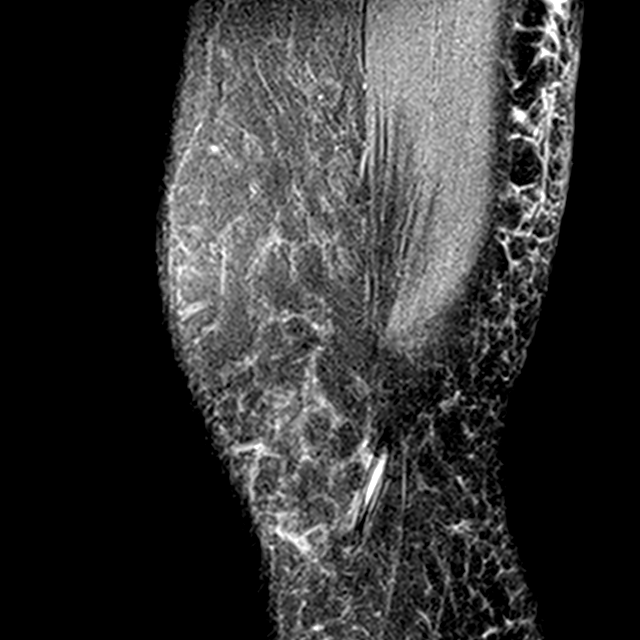

[Series 6: t2fs cor · coronal · 3.0mm · 0.19mm/px · 3 of 28 slices shown]
[im 6/28]
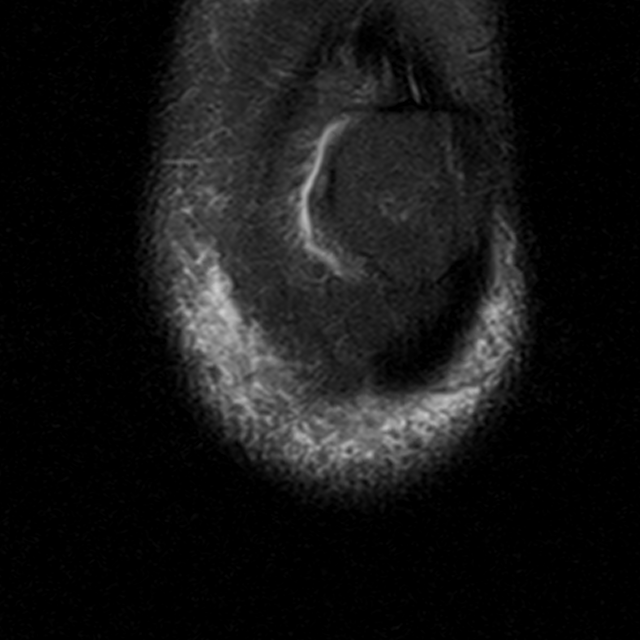
[im 17/28]
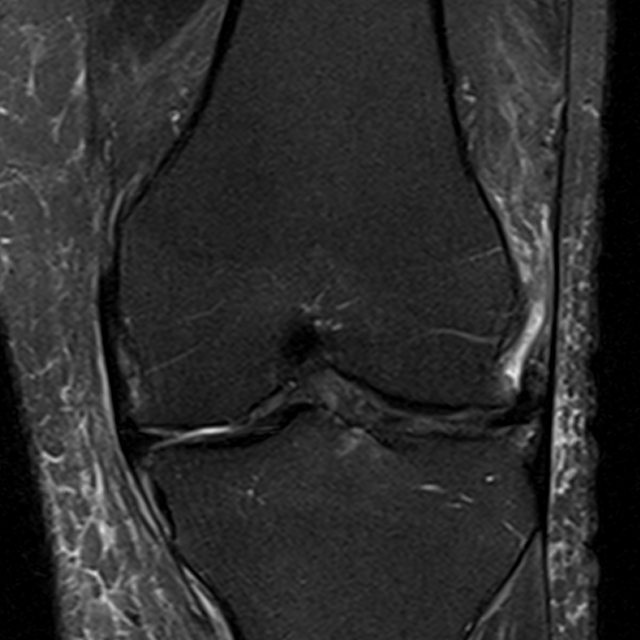
[im 28/28]
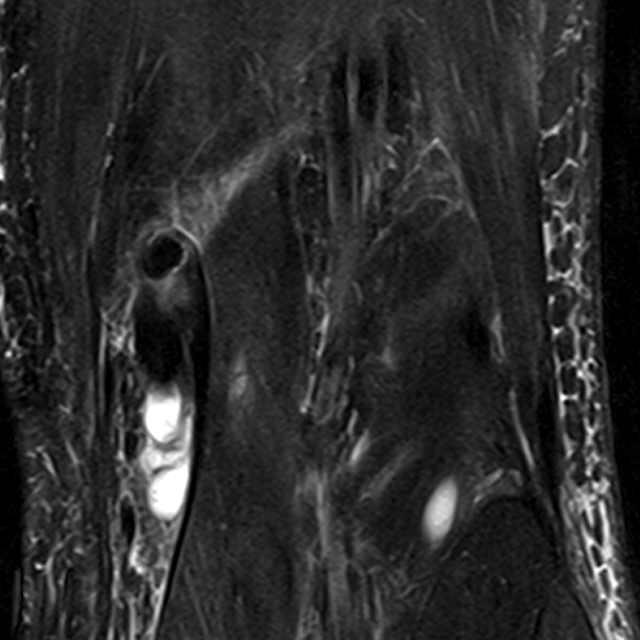

[13 of 40 positions shown; findings below may reference images not displayed]

FINDINGS: MENISCI

Medial meniscus: Degenerative signal is seen in the posterior horn
and body of the medial meniscus but no tear is identified. The
anterior horn of the lateral meniscus is not identified consistent
with degenerative maceration.

Lateral meniscus: Fraying along the free edge of the body is seen.
Marked degenerative signal is present in the posterior horn.

LIGAMENTS

Cruciates:  Intact.

Collaterals:  Intact.

CARTILAGE

Patellofemoral: Hyaline cartilage along the lateral patellar facet
and femoral trochlea is completely denuded. The patella is laterally
subluxed and rides on the lateral femoral trochlea.

Medial: Extensive hyaline cartilage thinning is present with
associated joint space narrowing.

Lateral:  Degenerated and irregular throughout.

Joint:  Small joint effusion.

Popliteal Fossa: Baker's cyst with septations measures 2.6 cm AP by
1.5 cm transverse by 3.4 cm craniocaudal.

Extensor Mechanism:  Intact.

Bones:  Prominent tricompartmental osteophytosis is seen.
IMPRESSION: Dominant finding is advanced tricompartmental osteoarthritis.

Degenerative maceration anterior horn lateral meniscus.

Septated Baker's cyst.

## 2016-09-06 ENCOUNTER — Telehealth: Payer: Self-pay | Admitting: Family Medicine

## 2016-09-24 ENCOUNTER — Other Ambulatory Visit: Payer: Self-pay | Admitting: Family Medicine

## 2016-09-30 ENCOUNTER — Encounter (INDEPENDENT_AMBULATORY_CARE_PROVIDER_SITE_OTHER): Payer: Medicare HMO | Admitting: Ophthalmology

## 2016-09-30 DIAGNOSIS — E11311 Type 2 diabetes mellitus with unspecified diabetic retinopathy with macular edema: Secondary | ICD-10-CM | POA: Diagnosis not present

## 2016-09-30 DIAGNOSIS — I10 Essential (primary) hypertension: Secondary | ICD-10-CM

## 2016-09-30 DIAGNOSIS — E113511 Type 2 diabetes mellitus with proliferative diabetic retinopathy with macular edema, right eye: Secondary | ICD-10-CM | POA: Diagnosis not present

## 2016-09-30 DIAGNOSIS — H35033 Hypertensive retinopathy, bilateral: Secondary | ICD-10-CM | POA: Diagnosis not present

## 2016-09-30 DIAGNOSIS — E113592 Type 2 diabetes mellitus with proliferative diabetic retinopathy without macular edema, left eye: Secondary | ICD-10-CM

## 2016-09-30 DIAGNOSIS — H43813 Vitreous degeneration, bilateral: Secondary | ICD-10-CM

## 2016-10-07 DIAGNOSIS — R69 Illness, unspecified: Secondary | ICD-10-CM | POA: Diagnosis not present

## 2016-10-09 ENCOUNTER — Other Ambulatory Visit: Payer: Self-pay | Admitting: Family Medicine

## 2016-10-09 DIAGNOSIS — L989 Disorder of the skin and subcutaneous tissue, unspecified: Secondary | ICD-10-CM

## 2016-10-15 DIAGNOSIS — H903 Sensorineural hearing loss, bilateral: Secondary | ICD-10-CM | POA: Diagnosis not present

## 2016-10-15 DIAGNOSIS — H9113 Presbycusis, bilateral: Secondary | ICD-10-CM | POA: Diagnosis not present

## 2016-10-22 ENCOUNTER — Other Ambulatory Visit: Payer: Self-pay | Admitting: Nurse Practitioner

## 2016-10-22 DIAGNOSIS — M7731 Calcaneal spur, right foot: Secondary | ICD-10-CM

## 2016-11-07 DIAGNOSIS — R69 Illness, unspecified: Secondary | ICD-10-CM | POA: Diagnosis not present

## 2016-11-11 ENCOUNTER — Encounter (INDEPENDENT_AMBULATORY_CARE_PROVIDER_SITE_OTHER): Payer: Medicare HMO | Admitting: Ophthalmology

## 2016-11-11 DIAGNOSIS — H43813 Vitreous degeneration, bilateral: Secondary | ICD-10-CM | POA: Diagnosis not present

## 2016-11-11 DIAGNOSIS — E113511 Type 2 diabetes mellitus with proliferative diabetic retinopathy with macular edema, right eye: Secondary | ICD-10-CM | POA: Diagnosis not present

## 2016-11-11 DIAGNOSIS — E113593 Type 2 diabetes mellitus with proliferative diabetic retinopathy without macular edema, bilateral: Secondary | ICD-10-CM

## 2016-11-11 DIAGNOSIS — H35033 Hypertensive retinopathy, bilateral: Secondary | ICD-10-CM

## 2016-11-11 DIAGNOSIS — I10 Essential (primary) hypertension: Secondary | ICD-10-CM | POA: Diagnosis not present

## 2016-12-12 ENCOUNTER — Other Ambulatory Visit: Payer: Self-pay | Admitting: Nurse Practitioner

## 2016-12-12 DIAGNOSIS — M7731 Calcaneal spur, right foot: Secondary | ICD-10-CM

## 2016-12-16 ENCOUNTER — Encounter (INDEPENDENT_AMBULATORY_CARE_PROVIDER_SITE_OTHER): Payer: Medicare HMO | Admitting: Ophthalmology

## 2016-12-16 DIAGNOSIS — H35033 Hypertensive retinopathy, bilateral: Secondary | ICD-10-CM

## 2016-12-16 DIAGNOSIS — H43813 Vitreous degeneration, bilateral: Secondary | ICD-10-CM

## 2016-12-16 DIAGNOSIS — E11311 Type 2 diabetes mellitus with unspecified diabetic retinopathy with macular edema: Secondary | ICD-10-CM | POA: Diagnosis not present

## 2016-12-16 DIAGNOSIS — E113513 Type 2 diabetes mellitus with proliferative diabetic retinopathy with macular edema, bilateral: Secondary | ICD-10-CM | POA: Diagnosis not present

## 2016-12-16 DIAGNOSIS — I10 Essential (primary) hypertension: Secondary | ICD-10-CM | POA: Diagnosis not present

## 2017-01-02 ENCOUNTER — Other Ambulatory Visit: Payer: Self-pay | Admitting: Nurse Practitioner

## 2017-01-02 DIAGNOSIS — M7731 Calcaneal spur, right foot: Secondary | ICD-10-CM

## 2017-01-14 ENCOUNTER — Telehealth: Payer: Self-pay | Admitting: Family Medicine

## 2017-01-14 ENCOUNTER — Telehealth: Payer: Self-pay

## 2017-01-14 NOTE — Telephone Encounter (Signed)
[facility] will contact pt with appt Pt notified

## 2017-01-14 NOTE — Telephone Encounter (Signed)
Pt was offered an appointment for tomorrow 01/15/17, but declined.Pt's wife states they will try to find the dermatologist pt has seen previously and schedule with him.

## 2017-01-14 NOTE — Telephone Encounter (Signed)
Please refer to dermatology

## 2017-01-20 ENCOUNTER — Encounter (INDEPENDENT_AMBULATORY_CARE_PROVIDER_SITE_OTHER): Payer: Medicare HMO | Admitting: Ophthalmology

## 2017-01-20 DIAGNOSIS — E11311 Type 2 diabetes mellitus with unspecified diabetic retinopathy with macular edema: Secondary | ICD-10-CM | POA: Diagnosis not present

## 2017-01-20 DIAGNOSIS — E113513 Type 2 diabetes mellitus with proliferative diabetic retinopathy with macular edema, bilateral: Secondary | ICD-10-CM | POA: Diagnosis not present

## 2017-01-20 DIAGNOSIS — I10 Essential (primary) hypertension: Secondary | ICD-10-CM

## 2017-01-20 DIAGNOSIS — H35033 Hypertensive retinopathy, bilateral: Secondary | ICD-10-CM | POA: Diagnosis not present

## 2017-01-20 DIAGNOSIS — H43813 Vitreous degeneration, bilateral: Secondary | ICD-10-CM | POA: Diagnosis not present

## 2017-01-31 ENCOUNTER — Other Ambulatory Visit: Payer: Self-pay | Admitting: Family Medicine

## 2017-01-31 DIAGNOSIS — M7731 Calcaneal spur, right foot: Secondary | ICD-10-CM

## 2017-02-03 ENCOUNTER — Ambulatory Visit: Payer: Medicare HMO | Admitting: Family Medicine

## 2017-02-06 DIAGNOSIS — D044 Carcinoma in situ of skin of scalp and neck: Secondary | ICD-10-CM | POA: Diagnosis not present

## 2017-02-06 DIAGNOSIS — L905 Scar conditions and fibrosis of skin: Secondary | ICD-10-CM | POA: Diagnosis not present

## 2017-02-06 DIAGNOSIS — L821 Other seborrheic keratosis: Secondary | ICD-10-CM | POA: Diagnosis not present

## 2017-02-06 DIAGNOSIS — L570 Actinic keratosis: Secondary | ICD-10-CM | POA: Diagnosis not present

## 2017-02-06 DIAGNOSIS — D485 Neoplasm of uncertain behavior of skin: Secondary | ICD-10-CM | POA: Diagnosis not present

## 2017-02-07 ENCOUNTER — Ambulatory Visit (INDEPENDENT_AMBULATORY_CARE_PROVIDER_SITE_OTHER): Payer: Medicare HMO | Admitting: Family Medicine

## 2017-02-07 ENCOUNTER — Encounter: Payer: Self-pay | Admitting: Family Medicine

## 2017-02-07 VITALS — BP 132/75 | HR 66 | Temp 97.7°F | Ht 65.0 in | Wt 183.0 lb

## 2017-02-07 DIAGNOSIS — R69 Illness, unspecified: Secondary | ICD-10-CM | POA: Diagnosis not present

## 2017-02-07 DIAGNOSIS — M1710 Unilateral primary osteoarthritis, unspecified knee: Secondary | ICD-10-CM | POA: Diagnosis not present

## 2017-02-07 DIAGNOSIS — M7731 Calcaneal spur, right foot: Secondary | ICD-10-CM

## 2017-02-07 DIAGNOSIS — E782 Mixed hyperlipidemia: Secondary | ICD-10-CM | POA: Diagnosis not present

## 2017-02-07 DIAGNOSIS — E119 Type 2 diabetes mellitus without complications: Secondary | ICD-10-CM

## 2017-02-07 DIAGNOSIS — N401 Enlarged prostate with lower urinary tract symptoms: Secondary | ICD-10-CM | POA: Diagnosis not present

## 2017-02-07 DIAGNOSIS — Z23 Encounter for immunization: Secondary | ICD-10-CM | POA: Diagnosis not present

## 2017-02-07 DIAGNOSIS — I10 Essential (primary) hypertension: Secondary | ICD-10-CM | POA: Diagnosis not present

## 2017-02-07 DIAGNOSIS — E559 Vitamin D deficiency, unspecified: Secondary | ICD-10-CM | POA: Diagnosis not present

## 2017-02-07 LAB — BAYER DCA HB A1C WAIVED: HB A1C (BAYER DCA - WAIVED): 6.8 % (ref <7.0)

## 2017-02-07 MED ORDER — LISINOPRIL 40 MG PO TABS: 40.0000 mg | ORAL_TABLET | Freq: Every day | ORAL | 1 refills | Status: DC

## 2017-02-07 MED ORDER — AMLODIPINE BESYLATE 5 MG PO TABS: 5.0000 mg | ORAL_TABLET | Freq: Every day | ORAL | 1 refills | Status: DC

## 2017-02-07 MED ORDER — TERBINAFINE HCL 250 MG PO TABS
250.0000 mg | ORAL_TABLET | Freq: Every day | ORAL | 2 refills | Status: DC
Start: 2017-02-07 — End: 2017-05-07

## 2017-02-07 MED ORDER — EASY TOUCH LANCETS 26G MISC: 3 refills | Status: DC

## 2017-02-07 MED ORDER — MELOXICAM 15 MG PO TABS: 15.0000 mg | ORAL_TABLET | Freq: Every day | ORAL | 0 refills | Status: DC

## 2017-02-07 MED ORDER — SIMVASTATIN 40 MG PO TABS: 20.0000 mg | ORAL_TABLET | Freq: Every day | ORAL | 0 refills | Status: DC

## 2017-02-07 MED ORDER — GLUCOSE BLOOD VI STRP: ORAL_STRIP | 12 refills | Status: DC

## 2017-02-07 MED ORDER — FINASTERIDE 5 MG PO TABS: 5.0000 mg | ORAL_TABLET | Freq: Every day | ORAL | 1 refills | Status: DC

## 2017-02-07 MED ORDER — METFORMIN HCL 1000 MG PO TABS: ORAL_TABLET | ORAL | 1 refills | Status: DC

## 2017-02-07 MED ORDER — OMEPRAZOLE 40 MG PO CPDR: 40.0000 mg | DELAYED_RELEASE_CAPSULE | Freq: Every day | ORAL | 1 refills | Status: DC

## 2017-02-07 NOTE — Progress Notes (Signed)
Subjective:  Patient ID: Jared Norman,  male    DOB: 1940/07/12  Age: 76 y.o.    CC: Diabetes (pt here today for routine follow up of his chronic medical conditions)   HPI KALAN YELEY presents for  follow-up of hypertension. Patient has no history of headache chest pain or shortness of breath or recent cough. Patient also denies symptoms of TIA such as numbness weakness lateralizing. Patient checks  blood pressure at home. Recent readings have been good Patient denies side effects from medication. States taking it regularly.  Patient also  in for follow-up of elevated cholesterol. Doing well without complaints on current medication. Denies side effects of statin including myalgia and arthralgia and nausea. Also in today for liver function testing. Currently no chest pain, shortness of breath or other cardiovascular related symptoms noted.  Follow-up of diabetes. Patient does check blood sugar at home. Readings run between 114 and 120s Patient denies symptoms such as polyuria, polydipsia, excessive hunger, nausea No significant hypoglycemic spells noted. Medications reviewed. Pt reports taking them regularly. Pt. denies complication/adverse reaction today.  Taking OTC Vit D. Needs level today due to past deficiency.  History Noah has a past medical history of BPH (benign prostatic hyperplasia); Cataract; Diabetes mellitus without complication ([place]); Hyperlipidemia; Hypertension; Kidney stones; and Vitamin D deficiency.   He has a past surgical history that includes right foot surgery (Right, 2008); Cystoscopy with retrograde pyelogram, ureteroscopy and stent placement (Left, 12/20/2013); and Eye surgery.   His family history includes Cancer in his father; Hypertension in his brother.He reports that he has never smoked. He has never used smokeless tobacco. He reports that he does not drink alcohol or use drugs.  Current Outpatient Prescriptions on File Prior to Visit  Medication Sig  Dispense Refill  . aspirin EC 81 MG tablet Take 81 mg by mouth every Monday, Wednesday, and Friday.    . cholecalciferol (VITAMIN D) 1000 UNITS tablet Take 2,000 Units by mouth daily.    . magnesium gluconate (MAGONATE) 500 MG tablet Take 500 mg by mouth daily.     No current facility-administered medications on file prior to visit.     ROS Review of Systems  Constitutional: Negative for chills, diaphoresis, fever and unexpected weight change.  HENT: Negative for congestion, hearing loss, rhinorrhea and sore throat.   Eyes: Negative for visual disturbance.  Respiratory: Negative for cough and shortness of breath.   Cardiovascular: Negative for chest pain.  Gastrointestinal: Negative for abdominal pain, constipation and diarrhea.  Genitourinary: Positive for frequency (some nocturia as well, improved with current med regimen). Negative for dysuria and flank pain.  Musculoskeletal: Negative for arthralgias and joint swelling.  Skin: Negative for rash.  Neurological: Negative for dizziness and headaches.  Psychiatric/Behavioral: Negative for dysphoric mood and sleep disturbance.    Objective:  BP 132/75   Pulse 66   Temp 97.7 F (36.5 C) (Oral)   Ht '5\' 5"'  (1.651 m)   Wt 183 lb (83 kg)   BMI 30.45 kg/m   BP Readings from Last 3 Encounters:  02/07/17 132/75  08/01/16 131/71  07/11/16 139/81    Wt Readings from Last 3 Encounters:  02/07/17 183 lb (83 kg)  08/01/16 187 lb (84.8 kg)  07/11/16 183 lb (83 kg)     Physical Exam  Constitutional: He is oriented to person, place, and time. He appears well-developed and well-nourished. No distress.  HENT:  Head: Normocephalic and atraumatic.  Right Ear: External ear normal.  Left  Ear: External ear normal.  Nose: Nose normal.  Mouth/Throat: Oropharynx is clear and moist.  Eyes: Pupils are equal, round, and reactive to light. Conjunctivae and EOM are normal.  Neck: Normal range of motion. Neck supple. No thyromegaly present.    Cardiovascular: Normal rate, regular rhythm and normal heart sounds.   No murmur heard. Pulmonary/Chest: Effort normal and breath sounds normal. No respiratory distress. He has no wheezes. He has no rales.  Abdominal: Soft. Bowel sounds are normal. He exhibits no distension. There is no tenderness.  Lymphadenopathy:    He has no cervical adenopathy.  Neurological: He is alert and oriented to person, place, and time. He has normal reflexes.  Skin: Skin is warm and dry.  Psychiatric: He has a normal mood and affect. His behavior is normal. Judgment and thought content normal.    Diabetic Foot Exam - Simple   Simple Foot Form Diabetic Foot exam was performed with the following findings:  Yes 02/07/2017  9:10 AM  Visual Inspection See comments:  Yes Sensation Testing Intact to touch and monofilament testing bilaterally:  Yes Pulse Check Posterior Tibialis and Dorsalis pulse intact bilaterally:  Yes Comments Toes of right foot, especially 1st toe have significant onychomycosis and yellow discoloration of nails. Left foot toenailss have the yellow discoloration, but no onychomycosis       Assessment & Plan:   Banyan was seen today for diabetes.  Diagnoses and all orders for this visit:  Diabetes mellitus without complication ([place]) -     CBC with Differential/Platelet -     Bayer DCA Hb A1c Waived  Mixed hyperlipidemia -     CMP14+EGFR -     Lipid panel  Essential hypertension  Arthritis of knee  Vitamin D deficiency -     VITAMIN D 25 Hydroxy (Vit-D Deficiency, Fractures)  Benign prostatic hyperplasia with lower urinary tract symptoms, symptom details unspecified  Heel spur, right -     meloxicam (MOBIC) 15 MG tablet; Take 1 tablet (15 mg total) by mouth daily.  Need for immunization against influenza -     Flu Vaccine QUAD 36+ mos IM  Other orders -     amLODipine (NORVASC) 5 MG tablet; Take 1 tablet (5 mg total) by mouth daily. -     EASY TOUCH LANCETS 26G MISC;  Use to check BS daily DX E11.9 -     finasteride (PROSCAR) 5 MG tablet; Take 1 tablet (5 mg total) by mouth daily. -     glucose blood test strip; Test glucose up to 4 times daily -     lisinopril (PRINIVIL,ZESTRIL) 40 MG tablet; Take 1 tablet (40 mg total) by mouth daily. -     metFORMIN (GLUCOPHAGE) 1000 MG tablet; TAKE ONE TABLET BY MOUTH ONCE DAILY WITH BREAKFAST -     omeprazole (PRILOSEC) 40 MG capsule; Take 1 capsule (40 mg total) by mouth daily. -     simvastatin (ZOCOR) 40 MG tablet; Take 0.5 tablets (20 mg total) by mouth at bedtime. -     terbinafine (LAMISIL) 250 MG tablet; Take 1 tablet (250 mg total) by mouth daily.   I have discontinued Mr. Corrales lisinopril, blood glucose meter kit and supplies, and meloxicam. I have also changed his meloxicam, finasteride, lisinopril, omeprazole, and simvastatin. Additionally, I am having him start on terbinafine. Lastly, I am having him maintain his magnesium gluconate, cholecalciferol, aspirin EC, amLODipine, EASY TOUCH LANCETS 26G, glucose blood, and metFORMIN.  Meds ordered this encounter  Medications  .  meloxicam (MOBIC) 15 MG tablet    Sig: Take 1 tablet (15 mg total) by mouth daily.    Dispense:  90 tablet    Refill:  0    Please consider 90 day supplies to promote better adherence  . amLODipine (NORVASC) 5 MG tablet    Sig: Take 1 tablet (5 mg total) by mouth daily.    Dispense:  90 tablet    Refill:  1  . EASY TOUCH LANCETS 26G MISC    Sig: Use to check BS daily DX E11.9    Dispense:  100 each    Refill:  3  . finasteride (PROSCAR) 5 MG tablet    Sig: Take 1 tablet (5 mg total) by mouth daily.    Dispense:  90 tablet    Refill:  1  . glucose blood test strip    Sig: Test glucose up to 4 times daily    Dispense:  100 each    Refill:  12    Dx: E11.9  . lisinopril (PRINIVIL,ZESTRIL) 40 MG tablet    Sig: Take 1 tablet (40 mg total) by mouth daily.    Dispense:  90 tablet    Refill:  1  . metFORMIN (GLUCOPHAGE) 1000 MG  tablet    Sig: TAKE ONE TABLET BY MOUTH ONCE DAILY WITH BREAKFAST    Dispense:  90 tablet    Refill:  1  . omeprazole (PRILOSEC) 40 MG capsule    Sig: Take 1 capsule (40 mg total) by mouth daily.    Dispense:  90 capsule    Refill:  1  . simvastatin (ZOCOR) 40 MG tablet    Sig: Take 0.5 tablets (20 mg total) by mouth at bedtime.    Dispense:  90 tablet    Refill:  0  . terbinafine (LAMISIL) 250 MG tablet    Sig: Take 1 tablet (250 mg total) by mouth daily.    Dispense:  30 tablet    Refill:  2     Follow-up: Return in about 6 months (around 08/07/2017) for diabetes.  Claretta Fraise, M.D.

## 2017-02-08 DIAGNOSIS — R69 Illness, unspecified: Secondary | ICD-10-CM | POA: Diagnosis not present

## 2017-02-08 LAB — CMP14+EGFR
ALT: 25 IU/L (ref 0–44)
AST: 19 IU/L (ref 0–40)
Albumin/Globulin Ratio: 2 (ref 1.2–2.2)
Albumin: 4.3 g/dL (ref 3.5–4.8)
Alkaline Phosphatase: 57 IU/L (ref 39–117)
BUN/Creatinine Ratio: 17 (ref 10–24)
BUN: 17 mg/dL (ref 8–27)
Bilirubin Total: 1.2 mg/dL (ref 0.0–1.2)
CO2: 26 mmol/L (ref 20–29)
Calcium: 9.1 mg/dL (ref 8.6–10.2)
Chloride: 101 mmol/L (ref 96–106)
Creatinine, Ser: 0.99 mg/dL (ref 0.76–1.27)
GFR calc Af Amer: 85 mL/min/{1.73_m2} (ref >59)
GFR calc non Af Amer: 73 mL/min/{1.73_m2} (ref >59)
Globulin, Total: 2.1 g/dL (ref 1.5–4.5)
Glucose: 115 mg/dL — ABNORMAL HIGH (ref 65–99)
Potassium: 4.8 mmol/L (ref 3.5–5.2)
Sodium: 141 mmol/L (ref 134–144)
Total Protein: 6.4 g/dL (ref 6.0–8.5)

## 2017-02-08 LAB — CBC WITH DIFFERENTIAL/PLATELET
Basophils Absolute: 0.1 10*3/uL (ref 0.0–0.2)
Basos: 1 %
EOS (ABSOLUTE): 0.3 10*3/uL (ref 0.0–0.4)
Eos: 4 %
Hematocrit: 42.4 % (ref 37.5–51.0)
Hemoglobin: 14.4 g/dL (ref 13.0–17.7)
Immature Grans (Abs): 0 10*3/uL (ref 0.0–0.1)
Immature Granulocytes: 0 %
Lymphocytes Absolute: 2.1 10*3/uL (ref 0.7–3.1)
Lymphs: 32 %
MCH: 31 pg (ref 26.6–33.0)
MCHC: 34 g/dL (ref 31.5–35.7)
MCV: 91 fL (ref 79–97)
Monocytes Absolute: 0.6 10*3/uL (ref 0.1–0.9)
Monocytes: 9 %
Neutrophils Absolute: 3.6 10*3/uL (ref 1.4–7.0)
Neutrophils: 54 %
Platelets: 200 10*3/uL (ref 150–379)
RBC: 4.64 x10E6/uL (ref 4.14–5.80)
RDW: 14.8 % (ref 12.3–15.4)
WBC: 6.6 10*3/uL (ref 3.4–10.8)

## 2017-02-08 LAB — LIPID PANEL
Chol/HDL Ratio: 2.6 ratio (ref 0.0–5.0)
Cholesterol, Total: 131 mg/dL (ref 100–199)
HDL: 50 mg/dL (ref >39)
LDL Calculated: 65 mg/dL (ref 0–99)
Triglycerides: 82 mg/dL (ref 0–149)
VLDL Cholesterol Cal: 16 mg/dL (ref 5–40)

## 2017-02-08 LAB — VITAMIN D 25 HYDROXY (VIT D DEFICIENCY, FRACTURES): Vit D, 25-Hydroxy: 43.7 ng/mL (ref 30.0–100.0)

## 2017-02-14 ENCOUNTER — Ambulatory Visit (INDEPENDENT_AMBULATORY_CARE_PROVIDER_SITE_OTHER): Payer: Medicare HMO | Admitting: Family Medicine

## 2017-02-14 ENCOUNTER — Encounter: Payer: Self-pay | Admitting: Family Medicine

## 2017-02-14 VITALS — BP 139/80 | HR 71 | Temp 97.0°F | Ht 65.0 in | Wt 181.0 lb

## 2017-02-14 DIAGNOSIS — R05 Cough: Secondary | ICD-10-CM

## 2017-02-14 DIAGNOSIS — T464X5A Adverse effect of angiotensin-converting-enzyme inhibitors, initial encounter: Secondary | ICD-10-CM

## 2017-02-14 DIAGNOSIS — R109 Unspecified abdominal pain: Secondary | ICD-10-CM | POA: Diagnosis not present

## 2017-02-14 DIAGNOSIS — R058 Other specified cough: Secondary | ICD-10-CM

## 2017-02-14 LAB — CBC WITH DIFFERENTIAL/PLATELET
Basophils Absolute: 0.1 10*3/uL (ref 0.0–0.2)
Basos: 1 %
EOS (ABSOLUTE): 0.3 10*3/uL (ref 0.0–0.4)
Eos: 4 %
Hematocrit: 41.9 % (ref 37.5–51.0)
Hemoglobin: 14.1 g/dL (ref 13.0–17.7)
Immature Grans (Abs): 0 10*3/uL (ref 0.0–0.1)
Immature Granulocytes: 0 %
Lymphocytes Absolute: 2.2 10*3/uL (ref 0.7–3.1)
Lymphs: 27 %
MCH: 30.9 pg (ref 26.6–33.0)
MCHC: 33.7 g/dL (ref 31.5–35.7)
MCV: 92 fL (ref 79–97)
Monocytes Absolute: 0.8 10*3/uL (ref 0.1–0.9)
Monocytes: 10 %
Neutrophils Absolute: 4.7 10*3/uL (ref 1.4–7.0)
Neutrophils: 58 %
Platelets: 202 10*3/uL (ref 150–379)
RBC: 4.57 x10E6/uL (ref 4.14–5.80)
RDW: 14.1 % (ref 12.3–15.4)
WBC: 8.1 10*3/uL (ref 3.4–10.8)

## 2017-02-14 MED ORDER — LOSARTAN POTASSIUM 100 MG PO TABS: 100.0000 mg | ORAL_TABLET | Freq: Every day | ORAL | 3 refills | Status: DC

## 2017-02-14 NOTE — Progress Notes (Signed)
Subjective:  Patient ID: Jared Norman, male    DOB: 22-Feb-1941  Age: 76 y.o. MRN: [id number]  CC: Abdominal Pain (pt here today c/o stomach pain and a cold which has hid him coughing. Pt and wife is concerned it could be related to either starting the Terbinafine or if it is due to the forceful coughing.)   HPI TRUEMAN WORLDS presents for left upper quadrant pain onset 2-3 days ago. He started with a bad cough about 4 days ago that was so severe it jarred his abdomen.there is no fever chills sweats and no shortness of breath. It was nonproductive. It started to taper off yesterday and today. However, the left upper quadrant pain seems to have increased. It is a cramping to sharp sensation it does not radiate it is localized at the costal margn. He denies any change in bowel movements including constipation and diarrhea.His wife is concerned that the cough has been chronic for quite some time based on the use of ace inhibitors.  Depression screen [facility] 2/9 02/14/2017 02/07/2017 08/01/2016  Decreased Interest 0 0 0  Down, Depressed, Hopeless 0 0 0  PHQ - 2 Score 0 0 0    History Malak has a past medical history of BPH (benign prostatic hyperplasia); Cataract; Diabetes mellitus without complication ([place]); Hyperlipidemia; Hypertension; Kidney stones; and Vitamin D deficiency.   He has a past surgical history that includes right foot surgery (Right, 2008); Cystoscopy with retrograde pyelogram, ureteroscopy and stent placement (Left, 12/20/2013); and Eye surgery.   His family history includes Cancer in his father; Hypertension in his brother.He reports that he has never smoked. He has never used smokeless tobacco. He reports that he does not drink alcohol or use drugs.    ROS Review of Systems  Constitutional: Negative for chills, diaphoresis, fever and unexpected weight change.  HENT: Negative for rhinorrhea and trouble swallowing.   Respiratory: Negative for cough, chest tightness and  shortness of breath.   Cardiovascular: Negative for chest pain.  Gastrointestinal: Positive for abdominal pain. Negative for abdominal distention, blood in stool, constipation, diarrhea, nausea, rectal pain and vomiting.  Genitourinary: Negative for dysuria, flank pain and hematuria.  Musculoskeletal: Negative for arthralgias and joint swelling.  Skin: Negative for rash.  Neurological: Negative for syncope and headaches.    Objective:  BP 139/80   Pulse 71   Temp (!) 97 F (36.1 C) (Oral)   Ht 5\' 5"  (1.651 m)   Wt 181 lb (82.1 kg)   BMI 30.12 kg/m   BP Readings from Last 3 Encounters:  02/14/17 139/80  02/07/17 132/75  08/01/16 131/71    Wt Readings from Last 3 Encounters:  02/14/17 181 lb (82.1 kg)  02/07/17 183 lb (83 kg)  08/01/16 187 lb (84.8 kg)     Physical Exam  Constitutional: He is oriented to person, place, and time. He appears well-developed and well-nourished.  HENT:  Head: Normocephalic and atraumatic.  Right Ear: Tympanic membrane and external ear normal. No decreased hearing is noted.  Left Ear: Tympanic membrane and external ear normal. No decreased hearing is noted.  Mouth/Throat: No oropharyngeal exudate or posterior oropharyngeal erythema.  Eyes: Pupils are equal, round, and reactive to light.  Neck: Normal range of motion. Neck supple.  Cardiovascular: Normal rate and regular rhythm.   No murmur heard. Pulmonary/Chest: Breath sounds normal. No respiratory distress.  Abdominal: Soft. Bowel sounds are normal. He exhibits no distension and no mass. There is tenderness ( perhaps minimal at the  left costal margin.). There is no rebound and no guarding.  Musculoskeletal: Normal range of motion.  Neurological: He is alert and oriented to person, place, and time.  Vitals reviewed.     Assessment & Plan:   Glover was seen today for abdominal pain.  Diagnoses and all orders for this visit:  Abdominal pain, unspecified abdominal location -     CBC  with Differential/Platelet  Cough due to ACE inhibitor  Other orders -     losartan (COZAAR) 100 MG tablet; Take 1 tablet (100 mg total) by mouth daily.    Patient is wife were reassured that the abdominal discomfort appears to be benign and likely related to a musculoskeletal spasm of either the abdominal wall or the diaphragm.   I have discontinued Mr. Cumpton lisinopril. I am also having him start on losartan. Additionally, I am having him maintain his magnesium gluconate, cholecalciferol, aspirin EC, meloxicam, amLODipine, EASY TOUCH LANCETS 26G, finasteride, glucose blood, metFORMIN, omeprazole, simvastatin, and terbinafine.  Allergies as of 02/14/2017   No Known Allergies     Medication List       Accurate as of 02/14/17  8:31 PM. Always use your most recent med list.          amLODipine 5 MG tablet Commonly known as:  NORVASC Take 1 tablet (5 mg total) by mouth daily.   aspirin EC 81 MG tablet Take 81 mg by mouth every Monday, Wednesday, and Friday.   cholecalciferol 1000 units tablet Commonly known as:  VITAMIN D Take 2,000 Units by mouth daily.   EASY TOUCH LANCETS 26G Misc Use to check BS daily DX E11.9   finasteride 5 MG tablet Commonly known as:  PROSCAR Take 1 tablet (5 mg total) by mouth daily.   glucose blood test strip Test glucose up to 4 times daily   losartan 100 MG tablet Commonly known as:  COZAAR Take 1 tablet (100 mg total) by mouth daily.   magnesium gluconate 500 MG tablet Commonly known as:  MAGONATE Take 500 mg by mouth daily.   meloxicam 15 MG tablet Commonly known as:  MOBIC Take 1 tablet (15 mg total) by mouth daily.   metFORMIN 1000 MG tablet Commonly known as:  GLUCOPHAGE TAKE ONE TABLET BY MOUTH ONCE DAILY WITH BREAKFAST   omeprazole 40 MG capsule Commonly known as:  PRILOSEC Take 1 capsule (40 mg total) by mouth daily.   simvastatin 40 MG tablet Commonly known as:  ZOCOR Take 0.5 tablets (20 mg total) by mouth at  bedtime.   terbinafine 250 MG tablet Commonly known as:  LAMISIL Take 1 tablet (250 mg total) by mouth daily.        Follow-up: Return if symptoms worsen or fail to improve.  Claretta Fraise, M.D.

## 2017-02-20 DIAGNOSIS — C4442 Squamous cell carcinoma of skin of scalp and neck: Secondary | ICD-10-CM | POA: Diagnosis not present

## 2017-02-24 ENCOUNTER — Encounter (INDEPENDENT_AMBULATORY_CARE_PROVIDER_SITE_OTHER): Payer: Medicare HMO | Admitting: Ophthalmology

## 2017-02-24 DIAGNOSIS — H35033 Hypertensive retinopathy, bilateral: Secondary | ICD-10-CM | POA: Diagnosis not present

## 2017-02-24 DIAGNOSIS — E113511 Type 2 diabetes mellitus with proliferative diabetic retinopathy with macular edema, right eye: Secondary | ICD-10-CM | POA: Diagnosis not present

## 2017-02-24 DIAGNOSIS — I10 Essential (primary) hypertension: Secondary | ICD-10-CM

## 2017-02-24 DIAGNOSIS — E113592 Type 2 diabetes mellitus with proliferative diabetic retinopathy without macular edema, left eye: Secondary | ICD-10-CM | POA: Diagnosis not present

## 2017-02-24 DIAGNOSIS — E11311 Type 2 diabetes mellitus with unspecified diabetic retinopathy with macular edema: Secondary | ICD-10-CM

## 2017-02-24 DIAGNOSIS — H43813 Vitreous degeneration, bilateral: Secondary | ICD-10-CM | POA: Diagnosis not present

## 2017-03-01 DIAGNOSIS — R69 Illness, unspecified: Secondary | ICD-10-CM | POA: Diagnosis not present

## 2017-03-10 ENCOUNTER — Other Ambulatory Visit: Payer: Medicare HMO

## 2017-03-10 DIAGNOSIS — E119 Type 2 diabetes mellitus without complications: Secondary | ICD-10-CM

## 2017-03-11 LAB — CMP14+EGFR
ALT: 27 IU/L (ref 0–44)
AST: 20 IU/L (ref 0–40)
Albumin/Globulin Ratio: 1.8 (ref 1.2–2.2)
Albumin: 4.3 g/dL (ref 3.5–4.8)
Alkaline Phosphatase: 64 IU/L (ref 39–117)
BUN/Creatinine Ratio: 14 (ref 10–24)
BUN: 18 mg/dL (ref 8–27)
Bilirubin Total: 0.8 mg/dL (ref 0.0–1.2)
CO2: 26 mmol/L (ref 20–29)
Calcium: 9.3 mg/dL (ref 8.6–10.2)
Chloride: 102 mmol/L (ref 96–106)
Creatinine, Ser: 1.25 mg/dL (ref 0.76–1.27)
GFR calc Af Amer: 64 mL/min/{1.73_m2} (ref >59)
GFR calc non Af Amer: 55 mL/min/{1.73_m2} — ABNORMAL LOW (ref >59)
Globulin, Total: 2.4 g/dL (ref 1.5–4.5)
Glucose: 160 mg/dL — ABNORMAL HIGH (ref 65–99)
Potassium: 5.1 mmol/L (ref 3.5–5.2)
Sodium: 141 mmol/L (ref 134–144)
Total Protein: 6.7 g/dL (ref 6.0–8.5)

## 2017-03-18 ENCOUNTER — Telehealth: Payer: Self-pay | Admitting: Family Medicine

## 2017-04-01 ENCOUNTER — Encounter (INDEPENDENT_AMBULATORY_CARE_PROVIDER_SITE_OTHER): Payer: Medicare HMO | Admitting: Ophthalmology

## 2017-04-01 DIAGNOSIS — H43813 Vitreous degeneration, bilateral: Secondary | ICD-10-CM

## 2017-04-01 DIAGNOSIS — E113592 Type 2 diabetes mellitus with proliferative diabetic retinopathy without macular edema, left eye: Secondary | ICD-10-CM

## 2017-04-01 DIAGNOSIS — H35033 Hypertensive retinopathy, bilateral: Secondary | ICD-10-CM | POA: Diagnosis not present

## 2017-04-01 DIAGNOSIS — I10 Essential (primary) hypertension: Secondary | ICD-10-CM

## 2017-04-01 DIAGNOSIS — E11311 Type 2 diabetes mellitus with unspecified diabetic retinopathy with macular edema: Secondary | ICD-10-CM

## 2017-04-01 DIAGNOSIS — E113511 Type 2 diabetes mellitus with proliferative diabetic retinopathy with macular edema, right eye: Secondary | ICD-10-CM | POA: Diagnosis not present

## 2017-05-01 ENCOUNTER — Encounter (INDEPENDENT_AMBULATORY_CARE_PROVIDER_SITE_OTHER): Payer: Medicare HMO | Admitting: Ophthalmology

## 2017-05-01 DIAGNOSIS — E113513 Type 2 diabetes mellitus with proliferative diabetic retinopathy with macular edema, bilateral: Secondary | ICD-10-CM | POA: Diagnosis not present

## 2017-05-01 DIAGNOSIS — H35033 Hypertensive retinopathy, bilateral: Secondary | ICD-10-CM | POA: Diagnosis not present

## 2017-05-01 DIAGNOSIS — I10 Essential (primary) hypertension: Secondary | ICD-10-CM

## 2017-05-01 DIAGNOSIS — E11311 Type 2 diabetes mellitus with unspecified diabetic retinopathy with macular edema: Secondary | ICD-10-CM

## 2017-05-01 DIAGNOSIS — H43813 Vitreous degeneration, bilateral: Secondary | ICD-10-CM | POA: Diagnosis not present

## 2017-05-07 ENCOUNTER — Other Ambulatory Visit: Payer: Self-pay | Admitting: Family Medicine

## 2017-05-07 NOTE — Telephone Encounter (Signed)
Last seen 02/14/17  Dr Livia Snellen

## 2017-06-02 ENCOUNTER — Encounter (INDEPENDENT_AMBULATORY_CARE_PROVIDER_SITE_OTHER): Payer: PPO | Admitting: Ophthalmology

## 2017-06-02 DIAGNOSIS — E11311 Type 2 diabetes mellitus with unspecified diabetic retinopathy with macular edema: Secondary | ICD-10-CM

## 2017-06-02 DIAGNOSIS — E113513 Type 2 diabetes mellitus with proliferative diabetic retinopathy with macular edema, bilateral: Secondary | ICD-10-CM | POA: Diagnosis not present

## 2017-06-02 DIAGNOSIS — H43813 Vitreous degeneration, bilateral: Secondary | ICD-10-CM | POA: Diagnosis not present

## 2017-06-02 DIAGNOSIS — I10 Essential (primary) hypertension: Secondary | ICD-10-CM

## 2017-06-02 DIAGNOSIS — H35033 Hypertensive retinopathy, bilateral: Secondary | ICD-10-CM | POA: Diagnosis not present

## 2017-06-09 DIAGNOSIS — H903 Sensorineural hearing loss, bilateral: Secondary | ICD-10-CM | POA: Diagnosis not present

## 2017-06-17 ENCOUNTER — Other Ambulatory Visit: Payer: Self-pay | Admitting: Family Medicine

## 2017-06-17 DIAGNOSIS — H903 Sensorineural hearing loss, bilateral: Secondary | ICD-10-CM | POA: Diagnosis not present

## 2017-06-17 DIAGNOSIS — M7731 Calcaneal spur, right foot: Secondary | ICD-10-CM

## 2017-06-18 NOTE — Telephone Encounter (Signed)
Last seen 02/14/17  Dr Berneice Heinrich

## 2017-07-03 ENCOUNTER — Encounter (INDEPENDENT_AMBULATORY_CARE_PROVIDER_SITE_OTHER): Payer: PPO | Admitting: Ophthalmology

## 2017-07-03 DIAGNOSIS — I10 Essential (primary) hypertension: Secondary | ICD-10-CM

## 2017-07-03 DIAGNOSIS — H35033 Hypertensive retinopathy, bilateral: Secondary | ICD-10-CM | POA: Diagnosis not present

## 2017-07-03 DIAGNOSIS — H43813 Vitreous degeneration, bilateral: Secondary | ICD-10-CM | POA: Diagnosis not present

## 2017-07-03 DIAGNOSIS — E113513 Type 2 diabetes mellitus with proliferative diabetic retinopathy with macular edema, bilateral: Secondary | ICD-10-CM

## 2017-07-03 DIAGNOSIS — E11311 Type 2 diabetes mellitus with unspecified diabetic retinopathy with macular edema: Secondary | ICD-10-CM

## 2017-07-03 LAB — HM DIABETES EYE EXAM

## 2017-07-05 ENCOUNTER — Other Ambulatory Visit: Payer: Self-pay | Admitting: Family Medicine

## 2017-07-24 ENCOUNTER — Other Ambulatory Visit: Payer: Self-pay | Admitting: Family Medicine

## 2017-08-06 ENCOUNTER — Encounter (INDEPENDENT_AMBULATORY_CARE_PROVIDER_SITE_OTHER): Payer: PPO | Admitting: Ophthalmology

## 2017-08-06 DIAGNOSIS — E113513 Type 2 diabetes mellitus with proliferative diabetic retinopathy with macular edema, bilateral: Secondary | ICD-10-CM | POA: Diagnosis not present

## 2017-08-06 DIAGNOSIS — E11311 Type 2 diabetes mellitus with unspecified diabetic retinopathy with macular edema: Secondary | ICD-10-CM | POA: Diagnosis not present

## 2017-08-06 DIAGNOSIS — H43813 Vitreous degeneration, bilateral: Secondary | ICD-10-CM

## 2017-08-06 DIAGNOSIS — I10 Essential (primary) hypertension: Secondary | ICD-10-CM | POA: Diagnosis not present

## 2017-08-06 DIAGNOSIS — H35033 Hypertensive retinopathy, bilateral: Secondary | ICD-10-CM

## 2017-08-15 ENCOUNTER — Ambulatory Visit (INDEPENDENT_AMBULATORY_CARE_PROVIDER_SITE_OTHER): Payer: PPO | Admitting: Family Medicine

## 2017-08-15 ENCOUNTER — Encounter: Payer: Self-pay | Admitting: Family Medicine

## 2017-08-15 VITALS — BP 129/66 | Ht 65.0 in | Wt 184.1 lb

## 2017-08-15 DIAGNOSIS — E119 Type 2 diabetes mellitus without complications: Secondary | ICD-10-CM | POA: Diagnosis not present

## 2017-08-15 DIAGNOSIS — I10 Essential (primary) hypertension: Secondary | ICD-10-CM | POA: Diagnosis not present

## 2017-08-15 DIAGNOSIS — E782 Mixed hyperlipidemia: Secondary | ICD-10-CM

## 2017-08-15 DIAGNOSIS — M7731 Calcaneal spur, right foot: Secondary | ICD-10-CM | POA: Diagnosis not present

## 2017-08-15 LAB — BAYER DCA HB A1C WAIVED: HB A1C (BAYER DCA - WAIVED): 7.3 % — ABNORMAL HIGH (ref <7.0)

## 2017-08-15 MED ORDER — OMEPRAZOLE 40 MG PO CPDR: 40.0000 mg | DELAYED_RELEASE_CAPSULE | Freq: Every day | ORAL | 1 refills | Status: DC

## 2017-08-15 MED ORDER — FINASTERIDE 5 MG PO TABS: 5.0000 mg | ORAL_TABLET | Freq: Every day | ORAL | 1 refills | Status: DC

## 2017-08-15 MED ORDER — AMLODIPINE BESYLATE 5 MG PO TABS: 5.0000 mg | ORAL_TABLET | Freq: Every day | ORAL | 1 refills | Status: DC

## 2017-08-15 MED ORDER — METFORMIN HCL 1000 MG PO TABS: ORAL_TABLET | ORAL | 1 refills | Status: DC

## 2017-08-15 MED ORDER — SIMVASTATIN 40 MG PO TABS: 20.0000 mg | ORAL_TABLET | Freq: Every day | ORAL | 1 refills | Status: DC

## 2017-08-15 MED ORDER — AMOXICILLIN-POT CLAVULANATE 875-125 MG PO TABS: 1.0000 | ORAL_TABLET | Freq: Two times a day (BID) | ORAL | 0 refills | Status: DC

## 2017-08-15 MED ORDER — MELOXICAM 15 MG PO TABS: 15.0000 mg | ORAL_TABLET | Freq: Every day | ORAL | 1 refills | Status: DC

## 2017-08-15 NOTE — Progress Notes (Signed)
Subjective:  Patient ID: Jared Norman,  male    DOB: 11-27-1940  Age: 77 y.o.    CC: Diabetes (pt here today for routine follow up of his chronic medical conditions)   HPI Jared Norman presents for  follow-up of hypertension. Patient has no history of headache chest pain or shortness of breath or recent cough. Patient also denies symptoms of TIA such as numbness weakness lateralizing. Patient checks  blood pressure at home. Recent readings have been good Patient denies side effects from medication. States taking it regularly.  Patient also  in for follow-up of elevated cholesterol. Doing well without complaints on current medication. Denies side effects of statin including myalgia and arthralgia and nausea. Also in today for liver function testing. Currently no chest pain, shortness of breath or other cardiovascular related symptoms noted.  Follow-up of diabetes. Patient does check blood sugar at home. Readings run around 130s-150 Wife is with him and gives history that he does not always eat correctly.  Patient admits to not exercising regularly. Patient denies symptoms such as polyuria, polydipsia, excessive hunger, nausea No significant hypoglycemic spells noted. Medications reviewed. Pt reports taking them regularly. Pt. denies complication/adverse reaction today.    History Jared Norman has a past medical history of BPH (benign prostatic hyperplasia), Cataract, Diabetes mellitus without complication (Jared Norman), Hyperlipidemia, Hypertension, Kidney stones, and Vitamin D deficiency.   He has a past surgical history that includes right foot surgery (Right, 2008); Cystoscopy with retrograde pyelogram, ureteroscopy and stent placement (Left, 12/20/2013); and Eye surgery.   His family history includes Cancer in his father; Hypertension in his brother.He reports that he has never smoked. He has never used smokeless tobacco. He reports that he does not drink alcohol or use drugs.  Current  Outpatient Medications on File Prior to Visit  Medication Sig Dispense Refill  . aspirin EC 81 MG tablet Take 81 mg by mouth every Monday, Wednesday, and Friday.    . cholecalciferol (VITAMIN D) 1000 UNITS tablet Take 2,000 Units by mouth daily.    Marland Kitchen EASY TOUCH LANCETS 26G MISC Use to check BS daily DX E11.9 100 each 3  . glucose blood test strip Test glucose up to 4 times daily 100 each 12  . losartan (COZAAR) 100 MG tablet Take 1 tablet (100 mg total) by mouth daily. 90 tablet 3  . magnesium gluconate (MAGONATE) 500 MG tablet Take 500 mg by mouth daily.     No current facility-administered medications on file prior to visit.     ROS Review of Systems  Constitutional: Negative.   HENT: Negative.   Eyes: Negative for visual disturbance.  Respiratory: Negative for cough and shortness of breath.   Cardiovascular: Negative for chest pain and leg swelling.  Gastrointestinal: Negative for abdominal pain, diarrhea, nausea and vomiting.  Genitourinary: Negative for difficulty urinating.  Musculoskeletal: Negative for arthralgias and myalgias.  Skin: Negative for rash.  Neurological: Negative for headaches.  Psychiatric/Behavioral: Negative for sleep disturbance.    Objective:  BP 129/66   Ht 5' 5" (1.651 m)   Wt 184 lb 2 oz (83.5 kg)   BMI 30.64 kg/m   BP Readings from Last 3 Encounters:  08/15/17 129/66  02/14/17 139/80  02/07/17 132/75    Wt Readings from Last 3 Encounters:  08/15/17 184 lb 2 oz (83.5 kg)  02/14/17 181 lb (82.1 kg)  02/07/17 183 lb (83 kg)     Physical Exam  Constitutional: He is oriented to person, place, and time.  He appears well-developed and well-nourished. No distress.  HENT:  Head: Normocephalic and atraumatic.  Right Ear: External ear normal.  Left Ear: External ear normal.  Nose: Nose normal.  Mouth/Throat: Oropharynx is clear and moist.  Eyes: Pupils are equal, round, and reactive to light. Conjunctivae and EOM are normal.  Neck: Normal  range of motion. Neck supple. No thyromegaly present.  Cardiovascular: Normal rate, regular rhythm and normal heart sounds.  No murmur heard. Pulmonary/Chest: Effort normal and breath sounds normal. No respiratory distress. He has no wheezes. He has no rales.  Abdominal: Soft. Bowel sounds are normal. He exhibits no distension. There is no tenderness.  Musculoskeletal: Normal range of motion. He exhibits tenderness (Right foot).  Lymphadenopathy:    He has no cervical adenopathy.  Neurological: He is alert and oriented to person, place, and time. He has normal reflexes.  Skin: Skin is warm and dry.  Psychiatric: He has a normal mood and affect. His behavior is normal. Judgment and thought content normal.        Assessment & Plan:   Jared Norman was seen today for diabetes.  Diagnoses and all orders for this visit:  Essential hypertension -     CBC with Differential/Platelet -     CMP14+EGFR  Diabetes mellitus without complication (HCC) -     Bayer DCA Hb A1c Waived  Mixed hyperlipidemia -     Lipid panel  Heel spur, right -     meloxicam (MOBIC) 15 MG tablet; Take 1 tablet (15 mg total) by mouth daily.  Other orders -     amoxicillin-clavulanate (AUGMENTIN) 875-125 MG tablet; Take 1 tablet by mouth 2 (two) times daily. -     amLODipine (NORVASC) 5 MG tablet; Take 1 tablet (5 mg total) by mouth daily. -     finasteride (PROSCAR) 5 MG tablet; Take 1 tablet (5 mg total) by mouth daily. -     metFORMIN (GLUCOPHAGE) 1000 MG tablet; TAKE ONE TABLET BY MOUTH ONCE DAILY WITH BREAKFAST -     omeprazole (PRILOSEC) 40 MG capsule; Take 1 capsule (40 mg total) by mouth daily. -     simvastatin (ZOCOR) 40 MG tablet; Take 0.5 tablets (20 mg total) by mouth at bedtime.   I have discontinued Ege L. Somera's terbinafine. I have also changed his meloxicam and amLODipine. Additionally, I am having him start on amoxicillin-clavulanate. Lastly, I am having him maintain his magnesium gluconate,  cholecalciferol, aspirin EC, EASY TOUCH LANCETS 26G, glucose blood, losartan, finasteride, metFORMIN, omeprazole, and simvastatin.  Meds ordered this encounter  Medications  . amoxicillin-clavulanate (AUGMENTIN) 875-125 MG tablet    Sig: Take 1 tablet by mouth 2 (two) times daily.    Dispense:  20 tablet    Refill:  0  . meloxicam (MOBIC) 15 MG tablet    Sig: Take 1 tablet (15 mg total) by mouth daily.    Dispense:  90 tablet    Refill:  1  . amLODipine (NORVASC) 5 MG tablet    Sig: Take 1 tablet (5 mg total) by mouth daily.    Dispense:  90 tablet    Refill:  1  . finasteride (PROSCAR) 5 MG tablet    Sig: Take 1 tablet (5 mg total) by mouth daily.    Dispense:  90 tablet    Refill:  1  . metFORMIN (GLUCOPHAGE) 1000 MG tablet    Sig: TAKE ONE TABLET BY MOUTH ONCE DAILY WITH BREAKFAST    Dispense:  90 tablet      Refill:  1  . omeprazole (PRILOSEC) 40 MG capsule    Sig: Take 1 capsule (40 mg total) by mouth daily.    Dispense:  90 capsule    Refill:  1  . simvastatin (ZOCOR) 40 MG tablet    Sig: Take 0.5 tablets (20 mg total) by mouth at bedtime.    Dispense:  90 tablet    Refill:  1     Follow-up: Return in about 3 months (around 11/14/2017).  Claretta Fraise, M.D.

## 2017-08-15 NOTE — Patient Instructions (Signed)
DASH Eating Plan DASH stands for "Dietary Approaches to Stop Hypertension." The DASH eating plan is a healthy eating plan that has been shown to reduce high blood pressure (hypertension). It may also reduce your risk for type 2 diabetes, heart disease, and stroke. The DASH eating plan may also help with weight loss. What are tips for following this plan? General guidelines  Avoid eating more than 2,300 mg (milligrams) of salt (sodium) a day. If you have hypertension, you may need to reduce your sodium intake to 1,500 mg a day.  Limit alcohol intake to no more than 1 drink a day for nonpregnant women and 2 drinks a day for men. One drink equals 12 oz of beer, 5 oz of wine, or 1 oz of hard liquor.  Work with your health care provider to maintain a healthy body weight or to lose weight. Ask what an ideal weight is for you.  Get at least 30 minutes of exercise that causes your heart to beat faster (aerobic exercise) most days of the week. Activities may include walking, swimming, or biking.  Work with your health care provider or diet and nutrition specialist (dietitian) to adjust your eating plan to your individual calorie needs. Reading food labels  Check food labels for the amount of sodium per serving. Choose foods with less than 5 percent of the Daily Value of sodium. Generally, foods with less than 300 mg of sodium per serving fit into this eating plan.  To find whole grains, look for the word "whole" as the first word in the ingredient list. Shopping  Buy products labeled as "low-sodium" or "no salt added."  Buy fresh foods. Avoid canned foods and premade or frozen meals. Cooking  Avoid adding salt when cooking. Use salt-free seasonings or herbs instead of table salt or sea salt. Check with your health care provider or pharmacist before using salt substitutes.  Do not fry foods. Cook foods using healthy methods such as baking, boiling, grilling, and broiling instead.  Cook with  heart-healthy oils, such as olive, canola, soybean, or sunflower oil. Meal planning   Eat a balanced diet that includes: ? 5 or more servings of fruits and vegetables each day. At each meal, try to fill half of your plate with fruits and vegetables. ? Up to 6-8 servings of whole grains each day. ? Less than 6 oz of lean meat, poultry, or fish each day. A 3-oz serving of meat is about the same size as a deck of cards. One egg equals 1 oz. ? 2 servings of low-fat dairy each day. ? A serving of nuts, seeds, or beans 5 times each week. ? Heart-healthy fats. Healthy fats called Omega-3 fatty acids are found in foods such as flaxseeds and coldwater fish, like sardines, salmon, and mackerel.  Limit how much you eat of the following: ? Canned or prepackaged foods. ? Food that is high in trans fat, such as fried foods. ? Food that is high in saturated fat, such as fatty meat. ? Sweets, desserts, sugary drinks, and other foods with added sugar. ? Full-fat dairy products.  Do not salt foods before eating.  Try to eat at least 2 vegetarian meals each week.  Eat more home-cooked food and less restaurant, buffet, and fast food.  When eating at a restaurant, ask that your food be prepared with less salt or no salt, if possible. What foods are recommended? The items listed may not be a complete list. Talk with your dietitian about what   dietary choices are best for you. Grains Whole-grain or whole-wheat bread. Whole-grain or whole-wheat pasta. Brown rice. Oatmeal. Quinoa. Bulgur. Whole-grain and low-sodium cereals. Pita bread. Low-fat, low-sodium crackers. Whole-wheat flour tortillas. Vegetables Fresh or frozen vegetables (raw, steamed, roasted, or grilled). Low-sodium or reduced-sodium tomato and vegetable juice. Low-sodium or reduced-sodium tomato sauce and tomato paste. Low-sodium or reduced-sodium canned vegetables. Fruits All fresh, dried, or frozen fruit. Canned fruit in natural juice (without  added sugar). Meat and other protein foods Skinless chicken or turkey. Ground chicken or turkey. Pork with fat trimmed off. Fish and seafood. Egg whites. Dried beans, peas, or lentils. Unsalted nuts, nut butters, and seeds. Unsalted canned beans. Lean cuts of beef with fat trimmed off. Low-sodium, lean deli meat. Dairy Low-fat (1%) or fat-free (skim) milk. Fat-free, low-fat, or reduced-fat cheeses. Nonfat, low-sodium ricotta or cottage cheese. Low-fat or nonfat yogurt. Low-fat, low-sodium cheese. Fats and oils Soft margarine without trans fats. Vegetable oil. Low-fat, reduced-fat, or light mayonnaise and salad dressings (reduced-sodium). Canola, safflower, olive, soybean, and sunflower oils. Avocado. Seasoning and other foods Herbs. Spices. Seasoning mixes without salt. Unsalted popcorn and pretzels. Fat-free sweets. What foods are not recommended? The items listed may not be a complete list. Talk with your dietitian about what dietary choices are best for you. Grains Baked goods made with fat, such as croissants, muffins, or some breads. Dry pasta or rice meal packs. Vegetables Creamed or fried vegetables. Vegetables in a cheese sauce. Regular canned vegetables (not low-sodium or reduced-sodium). Regular canned tomato sauce and paste (not low-sodium or reduced-sodium). Regular tomato and vegetable juice (not low-sodium or reduced-sodium). Pickles. Olives. Fruits Canned fruit in a light or heavy syrup. Fried fruit. Fruit in cream or butter sauce. Meat and other protein foods Fatty cuts of meat. Ribs. Fried meat. Bacon. Sausage. Bologna and other processed lunch meats. Salami. Fatback. Hotdogs. Bratwurst. Salted nuts and seeds. Canned beans with added salt. Canned or smoked fish. Whole eggs or egg yolks. Chicken or turkey with skin. Dairy Whole or 2% milk, cream, and half-and-half. Whole or full-fat cream cheese. Whole-fat or sweetened yogurt. Full-fat cheese. Nondairy creamers. Whipped toppings.  Processed cheese and cheese spreads. Fats and oils Butter. Stick margarine. Lard. Shortening. Ghee. Bacon fat. Tropical oils, such as coconut, palm kernel, or palm oil. Seasoning and other foods Salted popcorn and pretzels. Onion salt, garlic salt, seasoned salt, table salt, and sea salt. Worcestershire sauce. Tartar sauce. Barbecue sauce. Teriyaki sauce. Soy sauce, including reduced-sodium. Steak sauce. Canned and packaged gravies. Fish sauce. Oyster sauce. Cocktail sauce. Horseradish that you find on the shelf. Ketchup. Mustard. Meat flavorings and tenderizers. Bouillon cubes. Hot sauce and Tabasco sauce. Premade or packaged marinades. Premade or packaged taco seasonings. Relishes. Regular salad dressings. Where to find more information:  National Heart, Lung, and Blood Institute: www.nhlbi.nih.gov  American Heart Association: www.heart.org Summary  The DASH eating plan is a healthy eating plan that has been shown to reduce high blood pressure (hypertension). It may also reduce your risk for type 2 diabetes, heart disease, and stroke.  With the DASH eating plan, you should limit salt (sodium) intake to 2,300 mg a day. If you have hypertension, you may need to reduce your sodium intake to 1,500 mg a day.  When on the DASH eating plan, aim to eat more fresh fruits and vegetables, whole grains, lean proteins, low-fat dairy, and heart-healthy fats.  Work with your health care provider or diet and nutrition specialist (dietitian) to adjust your eating plan to your individual   calorie needs. This information is not intended to replace advice given to you by your health care provider. Make sure you discuss any questions you have with your health care provider. Document Released: 04/18/2011 Document Revised: 04/22/2016 Document Reviewed: 04/22/2016 Elsevier Interactive Patient Education  2018 [employer]  

## 2017-08-16 LAB — CMP14+EGFR
ALT: 27 IU/L (ref 0–44)
AST: 23 IU/L (ref 0–40)
Albumin/Globulin Ratio: 1.9 (ref 1.2–2.2)
Albumin: 4.2 g/dL (ref 3.5–4.8)
Alkaline Phosphatase: 69 IU/L (ref 39–117)
BUN/Creatinine Ratio: 15 (ref 10–24)
BUN: 19 mg/dL (ref 8–27)
Bilirubin Total: 0.6 mg/dL (ref 0.0–1.2)
CO2: 23 mmol/L (ref 20–29)
Calcium: 9.4 mg/dL (ref 8.6–10.2)
Chloride: 103 mmol/L (ref 96–106)
Creatinine, Ser: 1.25 mg/dL (ref 0.76–1.27)
GFR calc Af Amer: 64 mL/min/{1.73_m2} (ref >59)
GFR calc non Af Amer: 55 mL/min/{1.73_m2} — ABNORMAL LOW (ref >59)
Globulin, Total: 2.2 g/dL (ref 1.5–4.5)
Glucose: 190 mg/dL — ABNORMAL HIGH (ref 65–99)
Potassium: 4.9 mmol/L (ref 3.5–5.2)
Sodium: 142 mmol/L (ref 134–144)
Total Protein: 6.4 g/dL (ref 6.0–8.5)

## 2017-08-16 LAB — CBC WITH DIFFERENTIAL/PLATELET
Basophils Absolute: 0.1 10*3/uL (ref 0.0–0.2)
Basos: 1 %
EOS (ABSOLUTE): 0.2 10*3/uL (ref 0.0–0.4)
Eos: 3 %
Hematocrit: 40.8 % (ref 37.5–51.0)
Hemoglobin: 13.9 g/dL (ref 13.0–17.7)
Immature Grans (Abs): 0 10*3/uL (ref 0.0–0.1)
Immature Granulocytes: 0 %
Lymphocytes Absolute: 2.2 10*3/uL (ref 0.7–3.1)
Lymphs: 30 %
MCH: 31.6 pg (ref 26.6–33.0)
MCHC: 34.1 g/dL (ref 31.5–35.7)
MCV: 93 fL (ref 79–97)
Monocytes Absolute: 0.5 10*3/uL (ref 0.1–0.9)
Monocytes: 7 %
Neutrophils Absolute: 4.3 10*3/uL (ref 1.4–7.0)
Neutrophils: 59 %
Platelets: 205 10*3/uL (ref 150–379)
RBC: 4.4 x10E6/uL (ref 4.14–5.80)
RDW: 13.8 % (ref 12.3–15.4)
WBC: 7.3 10*3/uL (ref 3.4–10.8)

## 2017-08-16 LAB — LIPID PANEL
Chol/HDL Ratio: 3.5 ratio (ref 0.0–5.0)
Cholesterol, Total: 145 mg/dL (ref 100–199)
HDL: 41 mg/dL (ref >39)
LDL Calculated: 79 mg/dL (ref 0–99)
Triglycerides: 124 mg/dL (ref 0–149)
VLDL Cholesterol Cal: 25 mg/dL (ref 5–40)

## 2017-08-17 ENCOUNTER — Encounter: Payer: Self-pay | Admitting: Family Medicine

## 2017-08-18 ENCOUNTER — Telehealth: Payer: Self-pay | Admitting: Family Medicine

## 2017-08-18 NOTE — Telephone Encounter (Signed)
Reviewed all test results.

## 2017-08-19 DIAGNOSIS — L821 Other seborrheic keratosis: Secondary | ICD-10-CM | POA: Diagnosis not present

## 2017-08-19 DIAGNOSIS — L570 Actinic keratosis: Secondary | ICD-10-CM | POA: Diagnosis not present

## 2017-08-19 DIAGNOSIS — Z85828 Personal history of other malignant neoplasm of skin: Secondary | ICD-10-CM | POA: Diagnosis not present

## 2017-09-17 ENCOUNTER — Encounter (INDEPENDENT_AMBULATORY_CARE_PROVIDER_SITE_OTHER): Payer: PPO | Admitting: Ophthalmology

## 2017-09-17 DIAGNOSIS — E113513 Type 2 diabetes mellitus with proliferative diabetic retinopathy with macular edema, bilateral: Secondary | ICD-10-CM | POA: Diagnosis not present

## 2017-09-17 DIAGNOSIS — I10 Essential (primary) hypertension: Secondary | ICD-10-CM

## 2017-09-17 DIAGNOSIS — H43813 Vitreous degeneration, bilateral: Secondary | ICD-10-CM

## 2017-09-17 DIAGNOSIS — E11311 Type 2 diabetes mellitus with unspecified diabetic retinopathy with macular edema: Secondary | ICD-10-CM | POA: Diagnosis not present

## 2017-09-17 DIAGNOSIS — H35033 Hypertensive retinopathy, bilateral: Secondary | ICD-10-CM | POA: Diagnosis not present

## 2017-10-22 ENCOUNTER — Encounter (INDEPENDENT_AMBULATORY_CARE_PROVIDER_SITE_OTHER): Payer: PPO | Admitting: Ophthalmology

## 2017-10-22 DIAGNOSIS — I10 Essential (primary) hypertension: Secondary | ICD-10-CM

## 2017-10-22 DIAGNOSIS — H43813 Vitreous degeneration, bilateral: Secondary | ICD-10-CM

## 2017-10-22 DIAGNOSIS — H35033 Hypertensive retinopathy, bilateral: Secondary | ICD-10-CM | POA: Diagnosis not present

## 2017-10-22 DIAGNOSIS — E11311 Type 2 diabetes mellitus with unspecified diabetic retinopathy with macular edema: Secondary | ICD-10-CM

## 2017-10-22 DIAGNOSIS — E113513 Type 2 diabetes mellitus with proliferative diabetic retinopathy with macular edema, bilateral: Secondary | ICD-10-CM

## 2017-11-18 ENCOUNTER — Ambulatory Visit: Payer: PPO | Admitting: Family Medicine

## 2017-11-26 ENCOUNTER — Ambulatory Visit (INDEPENDENT_AMBULATORY_CARE_PROVIDER_SITE_OTHER): Payer: PPO

## 2017-11-26 ENCOUNTER — Ambulatory Visit (INDEPENDENT_AMBULATORY_CARE_PROVIDER_SITE_OTHER): Payer: PPO | Admitting: Family Medicine

## 2017-11-26 ENCOUNTER — Encounter: Payer: Self-pay | Admitting: Family Medicine

## 2017-11-26 VITALS — BP 173/83 | HR 67 | Temp 97.0°F | Ht 65.0 in | Wt 181.1 lb

## 2017-11-26 DIAGNOSIS — E782 Mixed hyperlipidemia: Secondary | ICD-10-CM | POA: Diagnosis not present

## 2017-11-26 DIAGNOSIS — M25511 Pain in right shoulder: Secondary | ICD-10-CM | POA: Diagnosis not present

## 2017-11-26 DIAGNOSIS — E119 Type 2 diabetes mellitus without complications: Secondary | ICD-10-CM | POA: Diagnosis not present

## 2017-11-26 LAB — BAYER DCA HB A1C WAIVED: HB A1C (BAYER DCA - WAIVED): 7.1 % — ABNORMAL HIGH (ref <7.0)

## 2017-11-26 MED ORDER — CHLORTHALIDONE 25 MG PO TABS: 25.0000 mg | ORAL_TABLET | ORAL | 1 refills | Status: DC

## 2017-11-26 NOTE — Patient Instructions (Signed)

## 2017-11-27 ENCOUNTER — Encounter (INDEPENDENT_AMBULATORY_CARE_PROVIDER_SITE_OTHER): Payer: PPO | Admitting: Ophthalmology

## 2017-11-27 DIAGNOSIS — I10 Essential (primary) hypertension: Secondary | ICD-10-CM

## 2017-11-27 DIAGNOSIS — E11311 Type 2 diabetes mellitus with unspecified diabetic retinopathy with macular edema: Secondary | ICD-10-CM

## 2017-11-27 DIAGNOSIS — H35033 Hypertensive retinopathy, bilateral: Secondary | ICD-10-CM | POA: Diagnosis not present

## 2017-11-27 DIAGNOSIS — H43813 Vitreous degeneration, bilateral: Secondary | ICD-10-CM

## 2017-11-27 DIAGNOSIS — E113513 Type 2 diabetes mellitus with proliferative diabetic retinopathy with macular edema, bilateral: Secondary | ICD-10-CM | POA: Diagnosis not present

## 2017-11-27 LAB — CBC WITH DIFFERENTIAL/PLATELET
Basophils Absolute: 0.1 10*3/uL (ref 0.0–0.2)
Basos: 1 %
EOS (ABSOLUTE): 0.2 10*3/uL (ref 0.0–0.4)
Eos: 2 %
Hematocrit: 43.4 % (ref 37.5–51.0)
Hemoglobin: 15 g/dL (ref 13.0–17.7)
Immature Grans (Abs): 0 10*3/uL (ref 0.0–0.1)
Immature Granulocytes: 1 %
Lymphocytes Absolute: 2.5 10*3/uL (ref 0.7–3.1)
Lymphs: 30 %
MCH: 31.6 pg (ref 26.6–33.0)
MCHC: 34.6 g/dL (ref 31.5–35.7)
MCV: 91 fL (ref 79–97)
Monocytes Absolute: 0.8 10*3/uL (ref 0.1–0.9)
Monocytes: 9 %
Neutrophils Absolute: 4.7 10*3/uL (ref 1.4–7.0)
Neutrophils: 57 %
Platelets: 223 10*3/uL (ref 150–450)
RBC: 4.75 x10E6/uL (ref 4.14–5.80)
RDW: 14.1 % (ref 12.3–15.4)
WBC: 8.2 10*3/uL (ref 3.4–10.8)

## 2017-11-27 LAB — LIPID PANEL
Chol/HDL Ratio: 3.3 ratio (ref 0.0–5.0)
Cholesterol, Total: 162 mg/dL (ref 100–199)
HDL: 49 mg/dL (ref >39)
LDL Calculated: 82 mg/dL (ref 0–99)
Triglycerides: 155 mg/dL — ABNORMAL HIGH (ref 0–149)
VLDL Cholesterol Cal: 31 mg/dL (ref 5–40)

## 2017-11-27 LAB — CMP14+EGFR
ALT: 46 IU/L — ABNORMAL HIGH (ref 0–44)
AST: 32 IU/L (ref 0–40)
Albumin/Globulin Ratio: 2 (ref 1.2–2.2)
Albumin: 4.6 g/dL (ref 3.5–4.8)
Alkaline Phosphatase: 68 IU/L (ref 39–117)
BUN/Creatinine Ratio: 17 (ref 10–24)
BUN: 20 mg/dL (ref 8–27)
Bilirubin Total: 1.4 mg/dL — ABNORMAL HIGH (ref 0.0–1.2)
CO2: 26 mmol/L (ref 20–29)
Calcium: 10 mg/dL (ref 8.6–10.2)
Chloride: 97 mmol/L (ref 96–106)
Creatinine, Ser: 1.19 mg/dL (ref 0.76–1.27)
GFR calc Af Amer: 68 mL/min/{1.73_m2} (ref >59)
GFR calc non Af Amer: 59 mL/min/{1.73_m2} — ABNORMAL LOW (ref >59)
Globulin, Total: 2.3 g/dL (ref 1.5–4.5)
Glucose: 148 mg/dL — ABNORMAL HIGH (ref 65–99)
Potassium: 5.2 mmol/L (ref 3.5–5.2)
Sodium: 141 mmol/L (ref 134–144)
Total Protein: 6.9 g/dL (ref 6.0–8.5)

## 2017-11-30 ENCOUNTER — Encounter: Payer: Self-pay | Admitting: Family Medicine

## 2017-11-30 NOTE — Progress Notes (Signed)
Subjective:  Patient ID: Jared Norman,  male    DOB: October 17, 1940  Age: 77 y.o.    CC: Medical Management of Chronic Issues   HPI Jared Norman presents for  follow-up of hypertension. Patient has no history of headache chest pain or shortness of breath or recent cough. Patient also denies symptoms of TIA such as numbness weakness lateralizing. Patient denies side effects from medication. States taking it regularly.  Patient also  in for follow-up of elevated cholesterol. Doing well without complaints on current medication. Denies side effects  including myalgia and arthralgia and nausea. Also in today for liver function testing. Currently no chest pain, shortness of breath or other cardiovascular related symptoms noted.  Follow-up of diabetes. Patient does check blood sugar at home. Readings run between 130 and 150 fasting. Not checking postprandial.  Patient denies symptoms such as excessive hunger or urinary frequency, excessive hunger, nausea No significant hypoglycemic spells noted. Medications reviewed. Pt reports taking them regularly. Pt. denies complication/adverse reaction today.   Pt. Tells me his brother who lives in [place] is near death. He has been traveling a lot to be with him. He also has a conference in [place]. Jared Norman coming up next week. (He is a retired Engineer, manufacturing systems.) [place] has a past medical history of BPH (benign prostatic hyperplasia), Cataract, Diabetes mellitus without complication ([place]), Hyperlipidemia, Hypertension, Kidney stones, and Vitamin D deficiency.   He has a past surgical history that includes right foot surgery (Right, 2008); Cystoscopy with retrograde pyelogram, ureteroscopy and stent placement (Left, 12/20/2013); and Eye surgery.   His family history includes Cancer in his father; Hypertension in his brother.He reports that he has never smoked. He has never used smokeless tobacco. He reports that he does not drink alcohol  or use drugs.  Current Outpatient Medications on File Prior to Visit  Medication Sig Dispense Refill  . amLODipine (NORVASC) 5 MG tablet Take 1 tablet (5 mg total) by mouth daily. 90 tablet 1  . aspirin EC 81 MG tablet Take 81 mg by mouth every Monday, Wednesday, and Friday.    . cholecalciferol (VITAMIN D) 1000 UNITS tablet Take 2,000 Units by mouth daily.    Marland Kitchen EASY TOUCH LANCETS 26G MISC Use to check BS daily DX E11.9 100 each 3  . finasteride (PROSCAR) 5 MG tablet Take 1 tablet (5 mg total) by mouth daily. 90 tablet 1  . glucose blood test strip Test glucose up to 4 times daily 100 each 12  . losartan (COZAAR) 100 MG tablet Take 1 tablet (100 mg total) by mouth daily. 90 tablet 3  . magnesium gluconate (MAGONATE) 500 MG tablet Take 500 mg by mouth daily.    . meloxicam (MOBIC) 15 MG tablet Take 1 tablet (15 mg total) by mouth daily. 90 tablet 1  . metFORMIN (GLUCOPHAGE) 1000 MG tablet TAKE ONE TABLET BY MOUTH ONCE DAILY WITH BREAKFAST 90 tablet 1  . omeprazole (PRILOSEC) 40 MG capsule Take 1 capsule (40 mg total) by mouth daily. 90 capsule 1  . simvastatin (ZOCOR) 40 MG tablet Take 0.5 tablets (20 mg total) by mouth at bedtime. 90 tablet 1   No current facility-administered medications on file prior to visit.     ROS Review of Systems  Constitutional: Negative.   HENT: Negative.   Eyes: Negative for visual disturbance.  Respiratory: Negative for cough and shortness of breath.   Cardiovascular: Negative for chest pain and leg swelling.  Gastrointestinal: Negative for abdominal  pain, diarrhea, nausea and vomiting.  Genitourinary: Negative for difficulty urinating.  Musculoskeletal: Negative for arthralgias (Right shoulder pain has been increasing slowly over an extended time. Now painful to raise overhead.) and myalgias.  Skin: Negative for rash.  Neurological: Negative for headaches.  Psychiatric/Behavioral: Negative for sleep disturbance.    Objective:  BP (!) 173/83   Pulse  67   Temp (!) 97 F (36.1 C) (Oral)   Ht '5\' 5"'  (1.651 m)   Wt 181 lb 1 oz (82.1 kg)   BMI 30.13 kg/m   BP Readings from Last 3 Encounters:  11/26/17 (!) 173/83  08/15/17 129/66  02/14/17 139/80    Wt Readings from Last 3 Encounters:  11/26/17 181 lb 1 oz (82.1 kg)  08/15/17 184 lb 2 oz (83.5 kg)  02/14/17 181 lb (82.1 kg)     Physical Exam  Constitutional: He is oriented to person, place, and time. He appears well-developed and well-nourished. No distress.  HENT:  Head: Normocephalic and atraumatic.  Right Ear: External ear normal.  Left Ear: External ear normal.  Nose: Nose normal.  Mouth/Throat: Oropharynx is clear and moist.  Eyes: Pupils are equal, round, and reactive to light. Conjunctivae and EOM are normal.  Neck: Normal range of motion. Neck supple.  Cardiovascular: Normal rate, regular rhythm and normal heart sounds.  No murmur heard. Pulmonary/Chest: Effort normal and breath sounds normal. No respiratory distress. He has no wheezes. He has no rales.  Abdominal: Soft. There is no tenderness.  Musculoskeletal: Normal range of motion. He exhibits tenderness (ball of right shoulder and proximal arm. Exacerbated by abduction, ext. rotation).  Neurological: He is alert and oriented to person, place, and time. He has normal reflexes.  Skin: Skin is warm and dry.  Psychiatric: He has a normal mood and affect. His behavior is normal. Judgment and thought content normal.    Diabetic Foot Exam - Simple   Simple Foot Form Diabetic Foot exam was performed with the following findings:  Yes 11/26/2017  8:30 AM  Visual Inspection No deformities, no ulcerations, no other skin breakdown bilaterally:  Yes Sensation Testing Intact to touch and monofilament testing bilaterally:  Yes Pulse Check Posterior Tibialis and Dorsalis pulse intact bilaterally:  Yes Comments       Assessment & Plan:   Vash was seen today for medical management of chronic issues.  Diagnoses and  all orders for this visit:  Mixed hyperlipidemia -     CBC with Differential/Platelet -     CMP14+EGFR -     Lipid panel  Diabetes mellitus without complication (HCC) -     Bayer DCA Hb A1c Waived  Right shoulder pain, unspecified chronicity -     DG Shoulder Right; Future  Other orders -     chlorthalidone (HYGROTON) 25 MG tablet; Take 1 tablet (25 mg total) by mouth every morning.   I have discontinued Jacquez L. Insalaco's amoxicillin-clavulanate. I am also having him start on chlorthalidone. Additionally, I am having him maintain his magnesium gluconate, cholecalciferol, aspirin EC, EASY TOUCH LANCETS 26G, glucose blood, losartan, meloxicam, amLODipine, finasteride, metFORMIN, omeprazole, and simvastatin.  Meds ordered this encounter  Medications  . chlorthalidone (HYGROTON) 25 MG tablet    Sig: Take 1 tablet (25 mg total) by mouth every morning.    Dispense:  90 tablet    Refill:  1   Pt. Agrees that BP needsto be brought under better control. [place] to take fluid med along with other meds. Will check at  home. Goal for DM pt 130/80  Follow-up: Return in about 3 months (around 02/26/2018).  Claretta Fraise, M.D.

## 2017-12-22 ENCOUNTER — Ambulatory Visit (INDEPENDENT_AMBULATORY_CARE_PROVIDER_SITE_OTHER): Payer: PPO | Admitting: Otolaryngology

## 2017-12-22 DIAGNOSIS — H6123 Impacted cerumen, bilateral: Secondary | ICD-10-CM

## 2017-12-22 DIAGNOSIS — H903 Sensorineural hearing loss, bilateral: Secondary | ICD-10-CM | POA: Diagnosis not present

## 2018-01-01 ENCOUNTER — Encounter (INDEPENDENT_AMBULATORY_CARE_PROVIDER_SITE_OTHER): Payer: PPO | Admitting: Ophthalmology

## 2018-01-01 DIAGNOSIS — H43813 Vitreous degeneration, bilateral: Secondary | ICD-10-CM

## 2018-01-01 DIAGNOSIS — E11311 Type 2 diabetes mellitus with unspecified diabetic retinopathy with macular edema: Secondary | ICD-10-CM | POA: Diagnosis not present

## 2018-01-01 DIAGNOSIS — H35033 Hypertensive retinopathy, bilateral: Secondary | ICD-10-CM

## 2018-01-01 DIAGNOSIS — E113513 Type 2 diabetes mellitus with proliferative diabetic retinopathy with macular edema, bilateral: Secondary | ICD-10-CM

## 2018-01-01 DIAGNOSIS — I10 Essential (primary) hypertension: Secondary | ICD-10-CM

## 2018-01-07 ENCOUNTER — Ambulatory Visit: Payer: PPO

## 2018-01-19 ENCOUNTER — Other Ambulatory Visit: Payer: Self-pay | Admitting: Family Medicine

## 2018-01-21 ENCOUNTER — Ambulatory Visit (INDEPENDENT_AMBULATORY_CARE_PROVIDER_SITE_OTHER): Payer: PPO | Admitting: *Deleted

## 2018-01-21 VITALS — BP 126/78 | HR 65 | Ht 65.0 in | Wt 181.8 lb

## 2018-01-21 DIAGNOSIS — Z0000 Encounter for general adult medical examination without abnormal findings: Secondary | ICD-10-CM | POA: Diagnosis not present

## 2018-01-21 DIAGNOSIS — H903 Sensorineural hearing loss, bilateral: Secondary | ICD-10-CM | POA: Insufficient documentation

## 2018-01-21 NOTE — Progress Notes (Addendum)
Subjective:   KOTARO BUER is a 77 y.o. male who presents for an Initial Medicare Annual Wellness Visit.  Mr. Fitzsimmons lives at home with is wife Marlowe Kays) of 4 years and dog Jearld Fenton).  He has 7 children, 16 grandchildren and 6 great grandchildren that he sees at least once a week.  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  He enjoys playing music on the guitar, organ and piano. Him and his family play instruments every Sunday at church.  Mr.  Onorato eat 3 unhealthy meals daily and does not exercise due to leg pain.  He has no ER visits, hospitalizations or surgeries in the past year.  Overall Mr. Cayson feels that health is about the same as it was a year ago.   Objective:    Today's Vitals   01/21/18 0824  BP: 126/78  Pulse: 65  Weight: 181 lb 12.8 oz (82.5 kg)  Height: 5\' 5"  (1.651 m)   Body mass index is 30.25 kg/m.  Advanced Directives 01/21/2018 07/24/2015 12/27/2013 12/20/2013 12/20/2013  Does Patient Have a Medical Advance Directive? No No No Patient does not have advance directive;Patient would not like information -  Would patient like information on creating a medical advance directive? Yes (MAU/Ambulatory/Procedural Areas - Information given) - No - patient declined information - -  Pre-existing out of facility DNR order (yellow form or pink MOST form) - - - No No    Current Medications (verified) Outpatient Encounter Medications as of 01/21/2018  Medication Sig  . amLODipine (NORVASC) 5 MG tablet Take 1 tablet (5 mg total) by mouth daily.  Marland Kitchen aspirin EC 81 MG tablet Take 81 mg by mouth every Monday, Wednesday, and Friday.  . chlorthalidone (HYGROTON) 25 MG tablet Take 1 tablet (25 mg total) by mouth every morning.  . cholecalciferol (VITAMIN D) 1000 UNITS tablet Take 2,000 Units by mouth daily.  Marland Kitchen EASY TOUCH LANCETS 26G MISC Use to check BS daily DX E11.9  . finasteride (PROSCAR) 5 MG tablet Take 1 tablet (5 mg total) by mouth daily.  Marland Kitchen glucose blood test  strip Test glucose up to 4 times daily  . losartan (COZAAR) 100 MG tablet TAKE 1 TABLET BY MOUTH ONCE DAILY  . magnesium gluconate (MAGONATE) 500 MG tablet Take 500 mg by mouth daily.  . meloxicam (MOBIC) 15 MG tablet Take 1 tablet (15 mg total) by mouth daily.  . metFORMIN (GLUCOPHAGE) 1000 MG tablet TAKE ONE TABLET BY MOUTH ONCE DAILY WITH BREAKFAST  . omeprazole (PRILOSEC) 40 MG capsule Take 1 capsule (40 mg total) by mouth daily.  . simvastatin (ZOCOR) 40 MG tablet Take 0.5 tablets (20 mg total) by mouth at bedtime.   No facility-administered encounter medications on file as of 01/21/2018.     Allergies (verified) Patient has no known allergies.   History: Past Medical History:  Diagnosis Date  . BPH (benign prostatic hyperplasia)   . Cataract   . Diabetes mellitus without complication ([place])   . Hyperlipidemia   . Hypertension   . Kidney stones   . Vitamin D deficiency    Past Surgical History:  Procedure Laterality Date  . CYSTOSCOPY WITH RETROGRADE PYELOGRAM, URETEROSCOPY AND STENT PLACEMENT Left 12/20/2013   Procedure: CYSTOSCOPY WITHLEFT RETROGRADE PYELOGRAM,  AND LEFT STENT PLACEMENT;  Surgeon: Malka So, MD;  Location: [facility];  Service: Urology;  Laterality: Left;  . EYE SURGERY    . right foot surgery Right 2008   tendon rupture  Family History  Problem Relation Age of Onset  . Hypertension Brother   . Cancer Father    Social History   Socioeconomic History  . Marital status: Married    Spouse name: Marlowe Kays  . Number of children: 7  . Years of education: 12th grade  . Highest education level: High school graduate  Occupational History  . Not on file  Social Needs  . Financial resource strain: Not hard at all  . Food insecurity:    Worry: Never true    Inability: Never true  . Transportation needs:    Medical: No    Non-medical: No  Tobacco Use  . Smoking status: Never Smoker  . Smokeless tobacco: Never Used  Substance and Sexual Activity  .  Alcohol use: No  . Drug use: No  . Sexual activity: Not Currently  Lifestyle  . Physical activity:    Days per week: 0 days    Minutes per session: 0 min  . Stress: Not at all  Relationships  . Social connections:    Talks on phone: More than three times a week    Gets together: More than three times a week    Attends religious service: More than 4 times per year    Active member of club or organization: Yes    Attends meetings of clubs or organizations: More than 4 times per year    Relationship status: Married  Other Topics Concern  . Not on file  Social History Narrative  . Not on file   Tobacco Counseling No tobacco use  Clinical Intake:  Pre-visit preparation completed: Yes        BMI - recorded: 30.1 Nutritional Status: BMI > 30  Obese Nutritional Risks: None Diabetes: Yes CBG done?: No Did pt. bring in CBG monitor from home?: No  How often do you need to have someone help you when you read instructions, pamphlets, or other written materials from your doctor or pharmacy?: 1 - Never What is the last grade level you completed in school?: 12th Grade  Interpreter Needed?: No  Information entered by :: Truett Mainland, LPN  Activities of Daily Living In your present state of health, do you have any difficulty performing the following activities: 01/21/2018  Hearing? N  Comment wears hearing aids  Vision? N  Difficulty concentrating or making decisions? N  Walking or climbing stairs? Y  Comment Some leg pain  Dressing or bathing? N  Doing errands, shopping? N  Some recent data might be hidden  Some difficulties with ADLs noted at this time   Immunizations and Health Maintenance Immunization History  Administered Date(s) Administered  . Influenza,inj,Quad PF,6+ Mos 02/26/2013, 04/19/2014, 02/20/2015, 02/15/2016, 02/07/2017  . Pneumococcal Conjugate-13 11/18/2014  . Pneumococcal Polysaccharide-23 05/13/2010  . Tdap 12/07/2013   There are no preventive  care reminders to display for this patient.  Patient Care Team: Claretta Fraise, MD as PCP - General (Family Medicine)  Indicate any recent Medical Services you may have received from other than [facility] providers in the past year (date may be approximate).    Assessment:   This is a routine wellness examination for Shreyas.  Hearing/Vision screen Wears hearing aids and glasses  Dietary issues and exercise activities discussed: Current Exercise Habits: The patient does not participate in regular exercise at present, Exercise limited by: orthopedic condition(s)  Goals    . Have 3 meals a day     Eat 3 healthy meals daily that consist of lean proteins, fruits  and vegetables Decrease carbohydrates       Depression Screen PHQ 2/9 Scores 01/21/2018 11/26/2017 08/15/2017 02/14/2017  PHQ - 2 Score 0 0 0 0    Fall Risk Fall Risk  01/21/2018 11/26/2017 08/15/2017 02/14/2017 02/07/2017  Falls in the past year? No No No No No  Number falls in past yr: - - - - -  Injury with Fall? - - - - -  Risk Factor Category  - - - - -    Is the patient's home free of loose throw rugs in walkways, pet beds, electrical cords, etc?   yes      Grab bars in the bathroom? yes      Handrails on the stairs?   yes      Adequate lighting?   yes  Timed Get Up and Go performed:   Cognitive Function: MMSE - Mini Mental State Exam 01/21/2018  Orientation to time 5  Orientation to Place 5  Registration 3  Attention/ Calculation 5  Recall 3  Language- name 2 objects 2  Language- repeat 1  Language- follow 3 step command 3  Language- read & follow direction 1  Write a sentence 1  Copy design 1  Total score 30  No memory loss noted at this time      Screening Tests Health Maintenance  Topic Date Due  . INFLUENZA VACCINE  03/02/2018 (Originally 12/11/2017)  . HEMOGLOBIN A1C  05/29/2018  . OPHTHALMOLOGY EXAM  07/03/2018  . FOOT EXAM  11/27/2018  . TETANUS/TDAP  12/08/2023  . PNA vac Low Risk Adult  Completed    Flu vaccine to be given at next office visit  Qualifies for Shingles Vaccine? Yes, will be done at pharmacy due to insurance coverage.   Cancer Screenings: Lung: Low Dose CT Chest recommended if Age 1-80 years, 30 pack-year currently smoking OR have quit w/in 15years. Patient does not qualify. Colorectal: NO   Additional Screenings: Hepatitis C Screening:       Plan:   Encouraged to eat 3 meals daily that consist of lean proteins, fruits and vegetables.  Encouraged to decrease carbohydrate intake.  Diabetic nutrition handout given and encouraged to read over.  Encouraged to try to exercise for at least 30 minutes, 3 times weekly.  Encouraged to look over advance directive packet and discuss with family.  Flu vaccine to be done at next office visit-vaccines not in stock at this time.  Encourage to have shingrix vaccine done at local pharmacy due to insurance coverage shingrix can not be given at this office.  Encouraged to keep follow up appointment with Dr. Livia Snellen on 03/02/2018 and to contact this office with any further questions or concerns.   I have personally reviewed and noted the following in the patient's chart:   . Medical and social history . Use of alcohol, tobacco or illicit drugs  . Current medications and supplements . Functional ability and status . Nutritional status . Physical activity . Advanced directives . List of other physicians . Hospitalizations, surgeries, and ER visits in previous 12 months . Vitals . Screenings to include cognitive, depression, and falls . Referrals and appointments  In addition, I have reviewed and discussed with patient certain preventive protocols, quality metrics, and best practice recommendations. A written personalized care plan for preventive services as well as general preventive health recommendations were provided to patient.     Wardell Heath, LPN   [id number]/43/[id number]     I have reviewed and agree with the  above AWV  documentation.  Claretta Fraise, M.D.

## 2018-01-21 NOTE — Patient Instructions (Addendum)
Keep Follow up appointment with Dr. Livia Snellen 03/02/2018 Flu shot to be done at next office visit Shingrix-shingles vaccine will need to be done at the pharmacy.    Jared Norman , Thank you for taking time to come for your Medicare Wellness Visit. I appreciate your ongoing commitment to your health goals. Please review the following plan we discussed and let me know if I can assist you in the future.   These are the goals we discussed: Goals    . Have 3 meals a day     Eat 3 healthy meals daily that consist of lean proteins, fruits and vegetables Decrease carbohydrates        This is a list of the screening recommended for you and due dates:  Health Maintenance  Topic Date Due  . Flu Shot  12/11/2017  . Hemoglobin A1C  05/29/2018  . Eye exam for diabetics  07/03/2018  . Complete foot exam   11/27/2018  . Tetanus Vaccine  12/08/2023  . Pneumonia vaccines  Completed     Diabetes Mellitus and Nutrition When you have diabetes (diabetes mellitus), it is very important to have healthy eating habits because your blood sugar (glucose) levels are greatly affected by what you eat and drink. Eating healthy foods in the appropriate amounts, at about the same times every day, can help you:  Control your blood glucose.  Lower your risk of heart disease.  Improve your blood pressure.  Reach or maintain a healthy weight.  Every person with diabetes is different, and each person has different needs for a meal plan. Your health care provider may recommend that you work with a diet and nutrition specialist (dietitian) to make a meal plan that is best for you. Your meal plan may vary depending on factors such as:  The calories you need.  The medicines you take.  Your weight.  Your blood glucose, blood pressure, and cholesterol levels.  Your activity level.  Other health conditions you have, such as heart or kidney disease.  How do carbohydrates affect me? Carbohydrates affect your blood  glucose level more than any other type of food. Eating carbohydrates naturally increases the amount of glucose in your blood. Carbohydrate counting is a method for keeping track of how many carbohydrates you eat. Counting carbohydrates is important to keep your blood glucose at a healthy level, especially if you use insulin or take certain oral diabetes medicines. It is important to know how many carbohydrates you can safely have in each meal. This is different for every person. Your dietitian can help you calculate how many carbohydrates you should have at each meal and for snack. Foods that contain carbohydrates include:  Bread, cereal, rice, pasta, and crackers.  Potatoes and corn.  Peas, beans, and lentils.  Milk and yogurt.  Fruit and juice.  Desserts, such as cakes, cookies, ice cream, and candy.  How does alcohol affect me? Alcohol can cause a sudden decrease in blood glucose (hypoglycemia), especially if you use insulin or take certain oral diabetes medicines. Hypoglycemia can be a life-threatening condition. Symptoms of hypoglycemia (sleepiness, dizziness, and confusion) are similar to symptoms of having too much alcohol. If your health care provider says that alcohol is safe for you, follow these guidelines:  Limit alcohol intake to no more than 1 drink per day for nonpregnant women and 2 drinks per day for men. One drink equals 12 oz of beer, 5 oz of wine, or 1 oz of hard liquor.  Do not  drink on an empty stomach.  Keep yourself hydrated with water, diet soda, or unsweetened iced tea.  Keep in mind that regular soda, juice, and other mixers may contain a lot of sugar and must be counted as carbohydrates.  What are tips for following this plan? Reading food labels  Start by checking the serving size on the label. The amount of calories, carbohydrates, fats, and other nutrients listed on the label are based on one serving of the food. Many foods contain more than one serving  per package.  Check the total grams (g) of carbohydrates in one serving. You can calculate the number of servings of carbohydrates in one serving by dividing the total carbohydrates by 15. For example, if a food has 30 g of total carbohydrates, it would be equal to 2 servings of carbohydrates.  Check the number of grams (g) of saturated and trans fats in one serving. Choose foods that have low or no amount of these fats.  Check the number of milligrams (mg) of sodium in one serving. Most people should limit total sodium intake to less than 2,300 mg per day.  Always check the nutrition information of foods labeled as "low-fat" or "nonfat". These foods may be higher in added sugar or refined carbohydrates and should be avoided.  Talk to your dietitian to identify your daily goals for nutrients listed on the label. Shopping  Avoid buying canned, premade, or processed foods. These foods tend to be high in fat, sodium, and added sugar.  Shop around the outside edge of the grocery store. This includes fresh fruits and vegetables, bulk grains, fresh meats, and fresh dairy. Cooking  Use low-heat cooking methods, such as baking, instead of high-heat cooking methods like deep frying.  Cook using healthy oils, such as olive, canola, or sunflower oil.  Avoid cooking with butter, cream, or high-fat meats. Meal planning  Eat meals and snacks regularly, preferably at the same times every day. Avoid going long periods of time without eating.  Eat foods high in fiber, such as fresh fruits, vegetables, beans, and whole grains. Talk to your dietitian about how many servings of carbohydrates you can eat at each meal.  Eat 4-6 ounces of lean protein each day, such as lean meat, chicken, fish, eggs, or tofu. 1 ounce is equal to 1 ounce of meat, chicken, or fish, 1 egg, or 1/4 cup of tofu.  Eat some foods each day that contain healthy fats, such as avocado, nuts, seeds, and fish. Lifestyle   Check your  blood glucose regularly.  Exercise at least 30 minutes 5 or more days each week, or as told by your health care provider.  Take medicines as told by your health care provider.  Do not use any products that contain nicotine or tobacco, such as cigarettes and e-cigarettes. If you need help quitting, ask your health care provider.  Work with a Social worker or diabetes educator to identify strategies to manage stress and any emotional and social challenges. What are some questions to ask my health care provider?  Do I need to meet with a diabetes educator?  Do I need to meet with a dietitian?  What number can I call if I have questions?  When are the best times to check my blood glucose? Where to find more information:  American Diabetes Association: diabetes.org/food-and-fitness/food  Academy of Nutrition and Dietetics: PokerClues.dk  [employer] of Diabetes and Digestive and Kidney Diseases (NIH): ContactWire.be Summary  A healthy meal plan will help  you control your blood glucose and maintain a healthy lifestyle.  Working with a diet and nutrition specialist (dietitian) can help you make a meal plan that is best for you.  Keep in mind that carbohydrates and alcohol have immediate effects on your blood glucose levels. It is important to count carbohydrates and to use alcohol carefully. This information is not intended to replace advice given to you by your health care provider. Make sure you discuss any questions you have with your health care provider. Document Released: 01/24/2005 Document Revised: 06/03/2016 Document Reviewed: 06/03/2016 Elsevier Interactive Patient Education  2018 [place] Directive Advance directives are legal documents that let you make choices ahead of time about your health care and medical treatment in case you  become unable to communicate for yourself. Advance directives are a way for you to communicate your wishes to family, friends, and health care providers. This can help convey your decisions about end-of-life care if you become unable to communicate. Discussing and writing advance directives should happen over time rather than all at once. Advance directives can be changed depending on your situation and what you want, even after you have signed the advance directives. If you do not have an advance directive, some states assign family decision makers to act on your behalf based on how closely you are related to them. Each state has its own laws regarding advance directives. You may want to check with your health care provider, attorney, or state representative about the laws in your state. There are different types of advance directives, such as:  Medical power of attorney.  Living will.  Do not resuscitate (DNR) or do not attempt resuscitation (DNAR) order.  Health care proxy and medical power of attorney A health care proxy, also called a health care agent, is a person who is appointed to make medical decisions for you in cases in which you are unable to make the decisions yourself. Generally, people choose someone they know well and trust to represent their preferences. Make sure to ask this person for an agreement to act as your proxy. A proxy may have to exercise judgment in the event of a medical decision for which your wishes are not known. A medical power of attorney is a legal document that names your health care proxy. Depending on the laws in your state, after the document is written, it may also need to be:  Signed.  Notarized.  Dated.  Copied.  Witnessed.  Incorporated into your medical record.  You may also want to appoint someone to manage your financial affairs in a situation in which you are unable to do so. This is called a durable power of attorney for finances. It is a  separate legal document from the durable power of attorney for health care. You may choose the same person or someone different from your health care proxy to act as your agent in financial matters. If you do not appoint a proxy, or if there is a concern that the proxy is not acting in your best interests, a court-appointed guardian may be designated to act on your behalf. Living will A living will is a set of instructions documenting your wishes about medical care when you cannot express them yourself. Health care providers should keep a copy of your living will in your medical record. You may want to give a copy to family members or friends. To alert caregivers in case of an emergency, you can place a  card in your wallet to let them know that you have a living will and where they can find it. A living will is used if you become:  Terminally ill.  Incapacitated.  Unable to communicate or make decisions.  Items to consider in your living will include:  The use or non-use of life-sustaining equipment, such as dialysis machines and breathing machines (ventilators).  A DNR or DNAR order, which is the instruction not to use cardiopulmonary resuscitation (CPR) if breathing or heartbeat stops.  The use or non-use of tube feeding.  Withholding of food and fluids.  Comfort (palliative) care when the goal becomes comfort rather than a cure.  Organ and tissue donation.  A living will does not give instructions for distributing your money and property if you should pass away. It is recommended that you seek the advice of a lawyer when writing a will. Decisions about taxes, beneficiaries, and asset distribution will be legally binding. This process can relieve your family and friends of any concerns surrounding disputes or questions that may come up about the distribution of your assets. DNR or DNAR A DNR or DNAR order is a request not to have CPR in the event that your heart stops beating or you stop  breathing. If a DNR or DNAR order has not been made and shared, a health care provider will try to help any patient whose heart has stopped or who has stopped breathing. If you plan to have surgery, talk with your health care provider about how your DNR or DNAR order will be followed if problems occur. Summary  Advance directives are the legal documents that allow you to make choices ahead of time about your health care and medical treatment in case you become unable to communicate for yourself.  The process of discussing and writing advance directives should happen over time. You can change the advance directives, even after you have signed them.  Advance directives include DNR or DNAR orders, living wills, and designating an agent as your medical power of attorney. This information is not intended to replace advice given to you by your health care provider. Make sure you discuss any questions you have with your health care provider. Document Released: 08/06/2007 Document Revised: 03/18/2016 Document Reviewed: 03/18/2016 Elsevier Interactive Patient Education  2017 [employer].

## 2018-02-04 ENCOUNTER — Telehealth: Payer: Self-pay | Admitting: Family Medicine

## 2018-02-04 ENCOUNTER — Other Ambulatory Visit: Payer: Self-pay | Admitting: *Deleted

## 2018-02-04 MED ORDER — BLOOD GLUCOSE MONITOR KIT: PACK | 3 refills | Status: DC

## 2018-02-04 MED ORDER — BLOOD GLUCOSE MONITOR KIT: PACK | 0 refills | Status: DC

## 2018-02-04 NOTE — Telephone Encounter (Signed)
Script for blood glucose meter has been faxed to pharmacy.

## 2018-02-09 ENCOUNTER — Encounter (INDEPENDENT_AMBULATORY_CARE_PROVIDER_SITE_OTHER): Payer: PPO | Admitting: Ophthalmology

## 2018-02-09 DIAGNOSIS — H35033 Hypertensive retinopathy, bilateral: Secondary | ICD-10-CM | POA: Diagnosis not present

## 2018-02-09 DIAGNOSIS — E113513 Type 2 diabetes mellitus with proliferative diabetic retinopathy with macular edema, bilateral: Secondary | ICD-10-CM | POA: Diagnosis not present

## 2018-02-09 DIAGNOSIS — I10 Essential (primary) hypertension: Secondary | ICD-10-CM

## 2018-02-09 DIAGNOSIS — E11311 Type 2 diabetes mellitus with unspecified diabetic retinopathy with macular edema: Secondary | ICD-10-CM

## 2018-02-09 DIAGNOSIS — H43813 Vitreous degeneration, bilateral: Secondary | ICD-10-CM | POA: Diagnosis not present

## 2018-02-12 ENCOUNTER — Encounter (INDEPENDENT_AMBULATORY_CARE_PROVIDER_SITE_OTHER): Payer: PPO | Admitting: Ophthalmology

## 2018-02-23 DIAGNOSIS — L821 Other seborrheic keratosis: Secondary | ICD-10-CM | POA: Diagnosis not present

## 2018-02-23 DIAGNOSIS — Z85828 Personal history of other malignant neoplasm of skin: Secondary | ICD-10-CM | POA: Diagnosis not present

## 2018-02-23 DIAGNOSIS — L570 Actinic keratosis: Secondary | ICD-10-CM | POA: Diagnosis not present

## 2018-03-02 ENCOUNTER — Ambulatory Visit (INDEPENDENT_AMBULATORY_CARE_PROVIDER_SITE_OTHER): Payer: PPO | Admitting: Family Medicine

## 2018-03-02 ENCOUNTER — Encounter: Payer: Self-pay | Admitting: Family Medicine

## 2018-03-02 VITALS — BP 123/68 | HR 67 | Temp 97.8°F | Ht 65.0 in | Wt 175.6 lb

## 2018-03-02 DIAGNOSIS — E559 Vitamin D deficiency, unspecified: Secondary | ICD-10-CM

## 2018-03-02 DIAGNOSIS — M7731 Calcaneal spur, right foot: Secondary | ICD-10-CM

## 2018-03-02 DIAGNOSIS — E119 Type 2 diabetes mellitus without complications: Secondary | ICD-10-CM

## 2018-03-02 DIAGNOSIS — E782 Mixed hyperlipidemia: Secondary | ICD-10-CM | POA: Diagnosis not present

## 2018-03-02 DIAGNOSIS — I10 Essential (primary) hypertension: Secondary | ICD-10-CM | POA: Diagnosis not present

## 2018-03-02 DIAGNOSIS — N401 Enlarged prostate with lower urinary tract symptoms: Secondary | ICD-10-CM | POA: Diagnosis not present

## 2018-03-02 LAB — URINALYSIS
Bilirubin, UA: NEGATIVE
Glucose, UA: NEGATIVE
Ketones, UA: NEGATIVE
Leukocytes, UA: NEGATIVE
Nitrite, UA: NEGATIVE
Protein, UA: NEGATIVE
Specific Gravity, UA: 1.02 (ref 1.005–1.030)
Urobilinogen, Ur: 0.2 mg/dL (ref 0.2–1.0)
pH, UA: 5 (ref 5.0–7.5)

## 2018-03-02 LAB — BAYER DCA HB A1C WAIVED: HB A1C (BAYER DCA - WAIVED): 7.1 % — ABNORMAL HIGH (ref <7.0)

## 2018-03-02 MED ORDER — SIMVASTATIN 40 MG PO TABS: 20.0000 mg | ORAL_TABLET | Freq: Every day | ORAL | 1 refills | Status: DC

## 2018-03-02 MED ORDER — OMEPRAZOLE 40 MG PO CPDR: 40.0000 mg | DELAYED_RELEASE_CAPSULE | Freq: Every day | ORAL | 1 refills | Status: DC

## 2018-03-02 MED ORDER — AMLODIPINE BESYLATE 5 MG PO TABS: 5.0000 mg | ORAL_TABLET | Freq: Every day | ORAL | 1 refills | Status: DC

## 2018-03-02 MED ORDER — LOSARTAN POTASSIUM 100 MG PO TABS: 100.0000 mg | ORAL_TABLET | Freq: Every day | ORAL | 1 refills | Status: DC

## 2018-03-02 MED ORDER — METFORMIN HCL 1000 MG PO TABS: ORAL_TABLET | ORAL | 1 refills | Status: DC

## 2018-03-02 MED ORDER — FINASTERIDE 5 MG PO TABS: 5.0000 mg | ORAL_TABLET | Freq: Every day | ORAL | 1 refills | Status: DC

## 2018-03-02 MED ORDER — MELOXICAM 15 MG PO TABS: 15.0000 mg | ORAL_TABLET | Freq: Every day | ORAL | 1 refills | Status: DC

## 2018-03-02 NOTE — Patient Instructions (Signed)
Use Dakin Foot solution for foot fungus.

## 2018-03-02 NOTE — Progress Notes (Signed)
Subjective:  Patient ID: Jared Norman,  male    DOB: 12-08-1940  Age: 77 y.o.    CC: Medical Management of Chronic Issues   HPI MOATAZ TAVIS presents for  follow-up of hypertension. Patient has no history of headache chest pain or shortness of breath or recent cough. Patient also denies symptoms of TIA such as numbness weakness lateralizing. Patient denies side effects from medication. States taking it regularly.  Patient also  in for follow-up of elevated cholesterol. Doing well without complaints on current medication. Denies side effects  including myalgia and arthralgia and nausea. Also in today for liver function testing. Currently no chest pain, shortness of breath or other cardiovascular related symptoms noted.  Follow-up of diabetes. Patient does check blood sugar at home. Readings run between 90 and 100.  It went up to 140 this morning because they had a pastor appreciation dinner and he overindulged yesterday.  This is a rare occurrence for him.  Patient denies symptoms such as excessive hunger or urinary frequency, excessive hunger, nausea No significant hypoglycemic spells noted. Medications reviewed. Pt reports taking them regularly. Pt. denies complication/adverse reaction today.    History Drexel has a past medical history of BPH (benign prostatic hyperplasia), Cataract, Diabetes mellitus without complication ([place]), Hyperlipidemia, Hypertension, Kidney stones, and Vitamin D deficiency.   He has a past surgical history that includes right foot surgery (Right, 2008); Cystoscopy with retrograde pyelogram, ureteroscopy and stent placement (Left, 12/20/2013); and Eye surgery.   His family history includes Cancer in his father; Hypertension in his brother.He reports that he has never smoked. He has never used smokeless tobacco. He reports that he does not drink alcohol or use drugs.  Current Outpatient Medications on File Prior to Visit  Medication Sig Dispense Refill  .  aspirin EC 81 MG tablet Take 81 mg by mouth every Monday, Wednesday, and Friday.    . blood glucose meter kit and supplies KIT Dispense based on patient and insurance preference. Use up to four times daily as directed. (FOR ICD-9 250.00, 250.01). 1 each 0  . chlorthalidone (HYGROTON) 25 MG tablet Take 1 tablet (25 mg total) by mouth every morning. 90 tablet 1  . cholecalciferol (VITAMIN D) 1000 UNITS tablet Take 2,000 Units by mouth daily.    Marland Kitchen EASY TOUCH LANCETS 26G MISC Use to check BS daily DX E11.9 100 each 3  . glucose blood test strip Test glucose up to 4 times daily 100 each 12  . magnesium gluconate (MAGONATE) 500 MG tablet Take 1,000 mg by mouth daily.      No current facility-administered medications on file prior to visit.     ROS Review of Systems  Constitutional: Negative.   HENT: Negative.   Eyes: Negative for visual disturbance.  Respiratory: Negative for cough and shortness of breath.   Cardiovascular: Negative for chest pain and leg swelling.  Gastrointestinal: Negative for abdominal pain, diarrhea, nausea and vomiting.  Genitourinary: Negative for difficulty urinating.  Musculoskeletal: Negative for arthralgias and myalgias.  Skin: Negative for rash.       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  Patient is curious since he finished the Lamisil several months ago.  Neurological: Negative for headaches.  Psychiatric/Behavioral: Negative for sleep disturbance.    Objective:  BP 123/68   Pulse 67   Temp 97.8 F (36.6 C) (Oral)   Ht _0  (1.651 m)   Wt 175 lb 9.6 oz (79.7 kg)   BMI 29.22 kg/m  BP Readings from Last 3 Encounters:  03/02/18 123/68  01/21/18 126/78  11/26/17 (!) 173/83    Wt Readings from Last 3 Encounters:  03/02/18 175 lb 9.6 oz (79.7 kg)  01/21/18 181 lb 12.8 oz (82.5 kg)  11/26/17 181 lb 1 oz (82.1 kg)     Physical Exam  Constitutional: He is oriented to person, place, and time. He appears well-developed and well-nourished. No  distress.  HENT:  Head: Normocephalic and atraumatic.  Right Ear: External ear normal.  Left Ear: External ear normal.  Nose: Nose normal.  Mouth/Throat: Oropharynx is clear and moist.  Eyes: Pupils are equal, round, and reactive to light. Conjunctivae and EOM are normal.  Neck: Normal range of motion. Neck supple.  Cardiovascular: Normal rate, regular rhythm and normal heart sounds.  No murmur heard. Pulmonary/Chest: Effort normal and breath sounds normal. No respiratory distress. He has no wheezes. He has no rales.  Abdominal: Soft. There is no tenderness.  Musculoskeletal: Normal range of motion.  Neurological: He is alert and oriented to person, place, and time. He has normal reflexes.  Skin: Skin is warm and dry.  Psychiatric: He has a normal mood and affect. His behavior is normal. Judgment and thought content normal.    Diabetic Foot Exam - Simple   Simple Foot Form Diabetic Foot exam was performed with the following findings:  Yes 03/02/2018  9:07 AM  Visual Inspection Sensation Testing Pulse Check Comments Onycholysis  and mattering under the nail of each first toe.  Dark discoloration of the nail as well.       Assessment & Plan:   Daveon was seen today for medical management of chronic issues.  Diagnoses and all orders for this visit:  Diabetes mellitus without complication ([place]) -     CBC with Differential/Platelet -     CMP14+EGFR -     Bayer DCA Hb A1c Waived -     Urinalysis  Heel spur, right -     CBC with Differential/Platelet -     CMP14+EGFR -     meloxicam (MOBIC) 15 MG tablet; Take 1 tablet (15 mg total) by mouth daily.  Vitamin D deficiency -     CBC with Differential/Platelet -     CMP14+EGFR -     VITAMIN D 25 Hydroxy (Vit-D Deficiency, Fractures) -     Urinalysis  Mixed hyperlipidemia  Essential hypertension  Benign prostatic hyperplasia with lower urinary tract symptoms, symptom details unspecified -     CBC with  Differential/Platelet -     CMP14+EGFR -     Urinalysis  Other orders -     simvastatin (ZOCOR) 40 MG tablet; Take 0.5 tablets (20 mg total) by mouth at bedtime. -     metFORMIN (GLUCOPHAGE) 1000 MG tablet; TAKE ONE TABLET BY MOUTH ONCE DAILY WITH BREAKFAST -     omeprazole (PRILOSEC) 40 MG capsule; Take 1 capsule (40 mg total) by mouth daily. -     losartan (COZAAR) 100 MG tablet; Take 1 tablet (100 mg total) by mouth daily. -     finasteride (PROSCAR) 5 MG tablet; Take 1 tablet (5 mg total) by mouth daily. -     amLODipine (NORVASC) 5 MG tablet; Take 1 tablet (5 mg total) by mouth daily.   I have changed Md L. Pinera's losartan. I am also having him maintain his magnesium gluconate, cholecalciferol, aspirin EC, EASY TOUCH LANCETS 26G, glucose blood, chlorthalidone, blood glucose meter kit and supplies, simvastatin, metFORMIN, omeprazole, meloxicam,  finasteride, and amLODipine.  Meds ordered this encounter  Medications  . simvastatin (ZOCOR) 40 MG tablet    Sig: Take 0.5 tablets (20 mg total) by mouth at bedtime.    Dispense:  90 tablet    Refill:  1  . metFORMIN (GLUCOPHAGE) 1000 MG tablet    Sig: TAKE ONE TABLET BY MOUTH ONCE DAILY WITH BREAKFAST    Dispense:  90 tablet    Refill:  1  . omeprazole (PRILOSEC) 40 MG capsule    Sig: Take 1 capsule (40 mg total) by mouth daily.    Dispense:  90 capsule    Refill:  1  . meloxicam (MOBIC) 15 MG tablet    Sig: Take 1 tablet (15 mg total) by mouth daily.    Dispense:  90 tablet    Refill:  1  . losartan (COZAAR) 100 MG tablet    Sig: Take 1 tablet (100 mg total) by mouth daily.    Dispense:  90 tablet    Refill:  1  . finasteride (PROSCAR) 5 MG tablet    Sig: Take 1 tablet (5 mg total) by mouth daily.    Dispense:  90 tablet    Refill:  1  . amLODipine (NORVASC) 5 MG tablet    Sig: Take 1 tablet (5 mg total) by mouth daily.    Dispense:  90 tablet    Refill:  1     Follow-up: Return in about 3 months (around  06/02/2018).  Claretta Fraise, M.D.

## 2018-03-03 ENCOUNTER — Other Ambulatory Visit: Payer: Self-pay | Admitting: Family Medicine

## 2018-03-03 LAB — CMP14+EGFR
ALT: 19 IU/L (ref 0–44)
AST: 17 IU/L (ref 0–40)
Albumin/Globulin Ratio: 2 (ref 1.2–2.2)
Albumin: 4.2 g/dL (ref 3.5–4.8)
Alkaline Phosphatase: 70 IU/L (ref 39–117)
BUN/Creatinine Ratio: 20 (ref 10–24)
BUN: 32 mg/dL — ABNORMAL HIGH (ref 8–27)
Bilirubin Total: 0.5 mg/dL (ref 0.0–1.2)
CO2: 24 mmol/L (ref 20–29)
Calcium: 9.5 mg/dL (ref 8.6–10.2)
Chloride: 103 mmol/L (ref 96–106)
Creatinine, Ser: 1.63 mg/dL — ABNORMAL HIGH (ref 0.76–1.27)
GFR calc Af Amer: 46 mL/min/{1.73_m2} — ABNORMAL LOW (ref >59)
GFR calc non Af Amer: 40 mL/min/{1.73_m2} — ABNORMAL LOW (ref >59)
Globulin, Total: 2.1 g/dL (ref 1.5–4.5)
Glucose: 138 mg/dL — ABNORMAL HIGH (ref 65–99)
Potassium: 4.8 mmol/L (ref 3.5–5.2)
Sodium: 141 mmol/L (ref 134–144)
Total Protein: 6.3 g/dL (ref 6.0–8.5)

## 2018-03-03 LAB — VITAMIN D 25 HYDROXY (VIT D DEFICIENCY, FRACTURES): Vit D, 25-Hydroxy: 46.4 ng/mL (ref 30.0–100.0)

## 2018-03-03 LAB — CBC WITH DIFFERENTIAL/PLATELET
Basophils Absolute: 0.1 10*3/uL (ref 0.0–0.2)
Basos: 1 %
EOS (ABSOLUTE): 0.2 10*3/uL (ref 0.0–0.4)
Eos: 3 %
Hematocrit: 39.7 % (ref 37.5–51.0)
Hemoglobin: 13.3 g/dL (ref 13.0–17.7)
Immature Grans (Abs): 0 10*3/uL (ref 0.0–0.1)
Immature Granulocytes: 0 %
Lymphocytes Absolute: 2.1 10*3/uL (ref 0.7–3.1)
Lymphs: 29 %
MCH: 31.2 pg (ref 26.6–33.0)
MCHC: 33.5 g/dL (ref 31.5–35.7)
MCV: 93 fL (ref 79–97)
Monocytes Absolute: 0.7 10*3/uL (ref 0.1–0.9)
Monocytes: 10 %
Neutrophils Absolute: 4.1 10*3/uL (ref 1.4–7.0)
Neutrophils: 57 %
Platelets: 240 10*3/uL (ref 150–450)
RBC: 4.26 x10E6/uL (ref 4.14–5.80)
RDW: 12.9 % (ref 12.3–15.4)
WBC: 7.2 10*3/uL (ref 3.4–10.8)

## 2018-03-30 ENCOUNTER — Encounter (INDEPENDENT_AMBULATORY_CARE_PROVIDER_SITE_OTHER): Payer: PPO | Admitting: Ophthalmology

## 2018-03-30 DIAGNOSIS — E113513 Type 2 diabetes mellitus with proliferative diabetic retinopathy with macular edema, bilateral: Secondary | ICD-10-CM | POA: Diagnosis not present

## 2018-03-30 DIAGNOSIS — E11311 Type 2 diabetes mellitus with unspecified diabetic retinopathy with macular edema: Secondary | ICD-10-CM | POA: Diagnosis not present

## 2018-03-30 DIAGNOSIS — H43813 Vitreous degeneration, bilateral: Secondary | ICD-10-CM

## 2018-03-30 DIAGNOSIS — H35033 Hypertensive retinopathy, bilateral: Secondary | ICD-10-CM

## 2018-03-30 DIAGNOSIS — I10 Essential (primary) hypertension: Secondary | ICD-10-CM | POA: Diagnosis not present

## 2018-04-27 ENCOUNTER — Encounter (INDEPENDENT_AMBULATORY_CARE_PROVIDER_SITE_OTHER): Payer: PPO | Admitting: Ophthalmology

## 2018-04-27 DIAGNOSIS — H35033 Hypertensive retinopathy, bilateral: Secondary | ICD-10-CM | POA: Diagnosis not present

## 2018-04-27 DIAGNOSIS — H43813 Vitreous degeneration, bilateral: Secondary | ICD-10-CM

## 2018-04-27 DIAGNOSIS — E11311 Type 2 diabetes mellitus with unspecified diabetic retinopathy with macular edema: Secondary | ICD-10-CM | POA: Diagnosis not present

## 2018-04-27 DIAGNOSIS — E113513 Type 2 diabetes mellitus with proliferative diabetic retinopathy with macular edema, bilateral: Secondary | ICD-10-CM

## 2018-04-27 DIAGNOSIS — I10 Essential (primary) hypertension: Secondary | ICD-10-CM | POA: Diagnosis not present

## 2018-05-11 DIAGNOSIS — D485 Neoplasm of uncertain behavior of skin: Secondary | ICD-10-CM | POA: Diagnosis not present

## 2018-05-11 DIAGNOSIS — L905 Scar conditions and fibrosis of skin: Secondary | ICD-10-CM | POA: Diagnosis not present

## 2018-05-13 DIAGNOSIS — I4510 Unspecified right bundle-branch block: Secondary | ICD-10-CM | POA: Diagnosis not present

## 2018-05-13 DIAGNOSIS — I10 Essential (primary) hypertension: Secondary | ICD-10-CM | POA: Diagnosis not present

## 2018-05-13 DIAGNOSIS — R0789 Other chest pain: Secondary | ICD-10-CM | POA: Diagnosis not present

## 2018-05-13 DIAGNOSIS — R072 Precordial pain: Secondary | ICD-10-CM | POA: Diagnosis not present

## 2018-05-13 DIAGNOSIS — K8050 Calculus of bile duct without cholangitis or cholecystitis without obstruction: Secondary | ICD-10-CM | POA: Diagnosis not present

## 2018-05-13 DIAGNOSIS — R079 Chest pain, unspecified: Secondary | ICD-10-CM | POA: Diagnosis not present

## 2018-05-13 DIAGNOSIS — R0689 Other abnormalities of breathing: Secondary | ICD-10-CM | POA: Diagnosis not present

## 2018-05-13 HISTORY — PX: LAPAROSCOPIC CHOLECYSTECTOMY: SUR755

## 2018-05-14 DIAGNOSIS — E119 Type 2 diabetes mellitus without complications: Secondary | ICD-10-CM | POA: Diagnosis not present

## 2018-05-14 DIAGNOSIS — I10 Essential (primary) hypertension: Secondary | ICD-10-CM | POA: Diagnosis not present

## 2018-05-14 DIAGNOSIS — K810 Acute cholecystitis: Secondary | ICD-10-CM | POA: Diagnosis not present

## 2018-05-14 DIAGNOSIS — D72829 Elevated white blood cell count, unspecified: Secondary | ICD-10-CM | POA: Diagnosis not present

## 2018-05-14 DIAGNOSIS — E785 Hyperlipidemia, unspecified: Secondary | ICD-10-CM | POA: Diagnosis not present

## 2018-05-14 DIAGNOSIS — R079 Chest pain, unspecified: Secondary | ICD-10-CM | POA: Diagnosis not present

## 2018-05-14 DIAGNOSIS — F419 Anxiety disorder, unspecified: Secondary | ICD-10-CM | POA: Diagnosis not present

## 2018-05-14 DIAGNOSIS — K760 Fatty (change of) liver, not elsewhere classified: Secondary | ICD-10-CM | POA: Diagnosis not present

## 2018-05-15 DIAGNOSIS — K8051 Calculus of bile duct without cholangitis or cholecystitis with obstruction: Secondary | ICD-10-CM | POA: Diagnosis not present

## 2018-05-15 DIAGNOSIS — E119 Type 2 diabetes mellitus without complications: Secondary | ICD-10-CM | POA: Diagnosis not present

## 2018-05-15 DIAGNOSIS — K811 Chronic cholecystitis: Secondary | ICD-10-CM | POA: Diagnosis not present

## 2018-05-15 DIAGNOSIS — K8000 Calculus of gallbladder with acute cholecystitis without obstruction: Secondary | ICD-10-CM | POA: Diagnosis not present

## 2018-05-15 DIAGNOSIS — I10 Essential (primary) hypertension: Secondary | ICD-10-CM | POA: Diagnosis not present

## 2018-05-15 DIAGNOSIS — D72829 Elevated white blood cell count, unspecified: Secondary | ICD-10-CM | POA: Diagnosis not present

## 2018-05-15 DIAGNOSIS — F419 Anxiety disorder, unspecified: Secondary | ICD-10-CM | POA: Diagnosis not present

## 2018-05-15 DIAGNOSIS — Z87891 Personal history of nicotine dependence: Secondary | ICD-10-CM | POA: Diagnosis not present

## 2018-05-15 DIAGNOSIS — E785 Hyperlipidemia, unspecified: Secondary | ICD-10-CM | POA: Diagnosis not present

## 2018-05-15 DIAGNOSIS — K810 Acute cholecystitis: Secondary | ICD-10-CM | POA: Diagnosis not present

## 2018-05-15 DIAGNOSIS — R079 Chest pain, unspecified: Secondary | ICD-10-CM | POA: Diagnosis not present

## 2018-05-26 ENCOUNTER — Ambulatory Visit (INDEPENDENT_AMBULATORY_CARE_PROVIDER_SITE_OTHER): Payer: PPO | Admitting: Family

## 2018-05-26 ENCOUNTER — Encounter: Payer: Self-pay | Admitting: Family

## 2018-05-26 ENCOUNTER — Encounter (INDEPENDENT_AMBULATORY_CARE_PROVIDER_SITE_OTHER): Payer: PPO | Admitting: Ophthalmology

## 2018-05-26 VITALS — BP 107/71 | HR 85 | Temp 98.3°F | Ht 65.0 in | Wt 168.4 lb

## 2018-05-26 DIAGNOSIS — Z09 Encounter for follow-up examination after completed treatment for conditions other than malignant neoplasm: Secondary | ICD-10-CM

## 2018-05-26 DIAGNOSIS — R1013 Epigastric pain: Secondary | ICD-10-CM

## 2018-05-26 DIAGNOSIS — R0789 Other chest pain: Secondary | ICD-10-CM | POA: Diagnosis not present

## 2018-05-26 DIAGNOSIS — K219 Gastro-esophageal reflux disease without esophagitis: Secondary | ICD-10-CM

## 2018-05-26 DIAGNOSIS — R112 Nausea with vomiting, unspecified: Secondary | ICD-10-CM | POA: Diagnosis not present

## 2018-05-26 MED ORDER — SUCRALFATE 1 GM/10ML PO SUSP: 1.0000 g | Freq: Three times a day (TID) | ORAL | 0 refills | Status: DC

## 2018-05-26 MED ORDER — PANTOPRAZOLE SODIUM 40 MG PO TBEC: 40.0000 mg | DELAYED_RELEASE_TABLET | Freq: Every day | ORAL | 3 refills | Status: DC

## 2018-05-26 NOTE — Patient Instructions (Signed)
Gastritis, Adult    Gastritis is swelling (inflammation) of the stomach. Gastritis can develop quickly (acute). It can also develop slowly over time (chronic). It is important to get help for this condition. If you do not get help, your stomach can bleed, and you can get sores (ulcers) in your stomach.  What are the causes?  This condition may be caused by:   Germs that get to your stomach.   Drinking too much alcohol.   Medicines you are taking.   Too much acid in the stomach.   A disease of the intestines or stomach.   Stress.   An allergic reaction.   Crohn's disease.   Some cancer treatments (radiation).  Sometimes the cause of this condition is not known.  What are the signs or symptoms?  Symptoms of this condition include:   Pain in your stomach.   A burning feeling in your stomach.   Feeling sick to your stomach (nauseous).   Throwing up (vomiting).   Feeling too full after you eat.   Weight loss.   Bad breath.   Throwing up blood.   Blood in your poop (stool).  How is this diagnosed?  This condition may be diagnosed with:   Your medical history and symptoms.   A physical exam.   Tests. These can include:  ? Blood tests.  ? Stool tests.  ? A procedure to look inside your stomach (upper endoscopy).  ? A test in which a sample of tissue is taken for testing (biopsy).  How is this treated?  Treatment for this condition depends on what caused it. You may be given:   Antibiotic medicine, if your condition was caused by germs.   H2 blockers and similar medicines, if your condition was caused by too much acid.  Follow these instructions at home:  Medicines   Take over-the-counter and prescription medicines only as told by your doctor.   If you were prescribed an antibiotic medicine, take it as told by your doctor. Do not stop taking it even if you start to feel better.  Eating and drinking     Eat small meals often, instead of large meals.   Avoid foods and drinks that make your symptoms  worse.   Drink enough fluid to keep your pee (urine) pale yellow.  Alcohol use   Do not drink alcohol if:  ? Your doctor tells you not to drink.  ? You are pregnant, may be pregnant, or are planning to become pregnant.   If you drink alcohol:  ? Limit your use to:   0-1 drink a day for women.   0-2 drinks a day for men.  ? Be aware of how much alcohol is in your drink. In the [place] one drink equals one 12 oz bottle of beer (355 mL), one 5 oz glass of wine (148 mL), or one 1 oz glass of hard liquor (44 mL).  General instructions   Talk with your doctor about ways to manage stress. You can exercise or do deep breathing, meditation, or yoga.   Do not smoke or use products that have nicotine or tobacco. If you need help quitting, ask your doctor.   Keep all follow-up visits as told by your doctor. This is important.  Contact a doctor if:   Your symptoms get worse.   Your symptoms go away and then come back.  Get help right away if:   You throw up blood or something that looks like coffee   grounds.   You have black or dark red poop.   You throw up any time you try to drink fluids.   Your stomach pain gets worse.   You have a fever.   You do not feel better after one week.  Summary   Gastritis is swelling (inflammation) of the stomach.   You must get help for this condition. If you do not get help, your stomach can bleed, and you can get sores (ulcers).   This condition is diagnosed with medical history, physical exam, or tests.   You can be treated with medicines for germs or medicines to block too much acid in your stomach.  This information is not intended to replace advice given to you by your health care provider. Make sure you discuss any questions you have with your health care provider.  Document Released: 10/16/2007 Document Revised: 09/16/2017 Document Reviewed: 09/16/2017  Elsevier Interactive Patient Education  2019 [employer]

## 2018-05-26 NOTE — Progress Notes (Signed)
Subjective:    Patient ID: Jared Norman, male    DOB: 03-Mar-1941, 78 y.o.   MRN: [id number]  Chief Complaint  Patient presents with  . nausea and vomitting    had gallbladder removed, vomitting again   Pt presents to the office with intermittent chest pain, nausea, and vomiting. He has this prior to 05/13/17 and was seen in the ED and then had his gallbladder removed on 05/15/17. After his surgery, his chest pain and vomiting had slightly improved. However, last night he had the same pain and states this was the same pain as he had before his gallbladder was removed.   At the ED, he had negative troponin and KUB.  Chest Pain   Associated symptoms include nausea and vomiting. Pertinent negatives include no cough or fever.  Emesis   This is a new problem. The current episode started 1 to 4 weeks ago. The problem has been waxing and waning. There has been no fever. Associated symptoms include chest pain. Pertinent negatives include no chills, coughing, fever or myalgias. He has tried bed rest and sleep for the symptoms. The treatment provided mild relief.  Gastroesophageal Reflux  He complains of belching, chest pain, heartburn and nausea. He reports no coughing. This is a recurrent problem. The current episode started in the past 7 days. The problem occurs frequently. He has tried a PPI for the symptoms. The treatment provided no relief.      Review of Systems  Constitutional: Negative for chills and fever.  Respiratory: Negative for cough.   Cardiovascular: Positive for chest pain.  Gastrointestinal: Positive for heartburn, nausea and vomiting.  Musculoskeletal: Negative for myalgias.  All other systems reviewed and are negative.      Objective:   Physical Exam Vitals signs reviewed.  Constitutional:      General: He is not in acute distress.    Appearance: He is well-developed.  HENT:     Head: Normocephalic.     Right Ear: Tympanic membrane normal.     Left Ear: Tympanic  membrane normal.  Eyes:     General:        Right eye: No discharge.        Left eye: No discharge.     Pupils: Pupils are equal, round, and reactive to light.  Neck:     Musculoskeletal: Normal range of motion and neck supple.     Thyroid: No thyromegaly.  Cardiovascular:     Rate and Rhythm: Normal rate and regular rhythm.     Heart sounds: Normal heart sounds. No murmur.  Pulmonary:     Effort: Pulmonary effort is normal. No respiratory distress.     Breath sounds: Normal breath sounds. No wheezing.  Abdominal:     General: Bowel sounds are normal. There is no distension.     Palpations: Abdomen is soft.     Tenderness: There is no abdominal tenderness (no tenderness noted on exam).  Musculoskeletal: Normal range of motion.        General: No tenderness.  Skin:    General: Skin is warm and dry.     Findings: No erythema or rash.  Neurological:     Mental Status: He is alert and oriented to person, place, and time.     Cranial Nerves: No cranial nerve deficit.     Deep Tendon Reflexes: Reflexes are normal and symmetric.  Psychiatric:        Behavior: Behavior normal.  Thought Content: Thought content normal.        Judgment: Judgment normal.       BP 107/71   Pulse 85   Temp 98.3 F (36.8 C) (Oral)   Ht '5\' 5"'  (1.651 m)   Wt 168 lb 6.4 oz (76.4 kg)   BMI 28.02 kg/m      Assessment & Plan:  Jared Norman comes in today with chief complaint of nausea and vomitting (had gallbladder removed, vomitting again)   Diagnosis and orders addressed:  1. Chest tightness - EKG 12-Lead - CMP14+EGFR - CBC with Differential/Platelet - H Pylori, IGM, IGG, IGA AB - Ambulatory referral to Gastroenterology  2. Hospital discharge follow-up - CMP14+EGFR - CBC with Differential/Platelet - Ambulatory referral to Gastroenterology  3. Nausea and vomiting, intractability of vomiting not specified, unspecified vomiting type - CMP14+EGFR - CBC with  Differential/Platelet - sucralfate (CARAFATE) 1 GM/10ML suspension; Take 10 mLs (1 g total) by mouth 4 (four) times daily -  with meals and at bedtime.  Dispense: 420 mL; Refill: 0 - H Pylori, IGM, IGG, IGA AB - Ambulatory referral to Gastroenterology  4. Gastroesophageal reflux disease, esophagitis presence not specified - CMP14+EGFR - CBC with Differential/Platelet - sucralfate (CARAFATE) 1 GM/10ML suspension; Take 10 mLs (1 g total) by mouth 4 (four) times daily -  with meals and at bedtime.  Dispense: 420 mL; Refill: 0 - Ambulatory referral to Gastroenterology  5. Epigastric pain - pantoprazole (PROTONIX) 40 MG tablet; Take 1 tablet (40 mg total) by mouth daily.  Dispense: 30 tablet; Refill: 3 - sucralfate (CARAFATE) 1 GM/10ML suspension; Take 10 mLs (1 g total) by mouth 4 (four) times daily -  with meals and at bedtime.  Dispense: 420 mL; Refill: 0 - H Pylori, IGM, IGG, IGA AB - Ambulatory referral to Gastroenterology   We will stop Prilosec and start Protonix.  Give Carafate  Labs pending EKG stable, no changes from hospital -Diet discussed- Avoid fried, spicy, citrus foods, caffeine and alcohol -Do not eat 2-3 hours before bedtime -Encouraged small frequent meals -Avoid NSAID's  GI referral pending  Keep follow up with PCP and RTO if symptoms worsen or do not improve    Evelina Dun, FNP

## 2018-05-27 LAB — CMP14+EGFR
ALT: 26 IU/L (ref 0–44)
AST: 16 IU/L (ref 0–40)
Albumin/Globulin Ratio: 1.8 (ref 1.2–2.2)
Albumin: 4 g/dL (ref 3.5–4.8)
Alkaline Phosphatase: 81 IU/L (ref 39–117)
BUN/Creatinine Ratio: 14 (ref 10–24)
BUN: 23 mg/dL (ref 8–27)
Bilirubin Total: 1.3 mg/dL — ABNORMAL HIGH (ref 0.0–1.2)
CO2: 25 mmol/L (ref 20–29)
Calcium: 9.4 mg/dL (ref 8.6–10.2)
Chloride: 95 mmol/L — ABNORMAL LOW (ref 96–106)
Creatinine, Ser: 1.64 mg/dL — ABNORMAL HIGH (ref 0.76–1.27)
GFR calc Af Amer: 46 mL/min/{1.73_m2} — ABNORMAL LOW (ref >59)
GFR calc non Af Amer: 39 mL/min/{1.73_m2} — ABNORMAL LOW (ref >59)
Globulin, Total: 2.2 g/dL (ref 1.5–4.5)
Glucose: 137 mg/dL — ABNORMAL HIGH (ref 65–99)
Potassium: 4.1 mmol/L (ref 3.5–5.2)
Sodium: 141 mmol/L (ref 134–144)
Total Protein: 6.2 g/dL (ref 6.0–8.5)

## 2018-05-27 LAB — CBC WITH DIFFERENTIAL/PLATELET
Basophils Absolute: 0.1 10*3/uL (ref 0.0–0.2)
Basos: 1 %
EOS (ABSOLUTE): 0.1 10*3/uL (ref 0.0–0.4)
Eos: 1 %
Hematocrit: 38.6 % (ref 37.5–51.0)
Hemoglobin: 13.2 g/dL (ref 13.0–17.7)
Immature Grans (Abs): 0 10*3/uL (ref 0.0–0.1)
Immature Granulocytes: 0 %
Lymphocytes Absolute: 2.5 10*3/uL (ref 0.7–3.1)
Lymphs: 22 %
MCH: 31.1 pg (ref 26.6–33.0)
MCHC: 34.2 g/dL (ref 31.5–35.7)
MCV: 91 fL (ref 79–97)
Monocytes Absolute: 1 10*3/uL — ABNORMAL HIGH (ref 0.1–0.9)
Monocytes: 9 %
Neutrophils Absolute: 7.8 10*3/uL — ABNORMAL HIGH (ref 1.4–7.0)
Neutrophils: 67 %
Platelets: 366 10*3/uL (ref 150–450)
RBC: 4.24 x10E6/uL (ref 4.14–5.80)
RDW: 12.7 % (ref 11.6–15.4)
WBC: 11.5 10*3/uL — ABNORMAL HIGH (ref 3.4–10.8)

## 2018-05-27 LAB — H PYLORI, IGM, IGG, IGA AB
H pylori, IgM Abs: <9 units (ref 0.0–8.9)
H. pylori, IgA Abs: <9 units (ref 0.0–8.9)
H. pylori, IgG AbS: 0.45 Index Value (ref 0.00–0.79)

## 2018-05-29 ENCOUNTER — Ambulatory Visit (INDEPENDENT_AMBULATORY_CARE_PROVIDER_SITE_OTHER): Payer: PPO | Admitting: Family Medicine

## 2018-05-29 ENCOUNTER — Encounter: Payer: Self-pay | Admitting: Family Medicine

## 2018-05-29 VITALS — BP 132/80 | HR 75 | Temp 98.4°F | Ht 65.0 in | Wt 171.0 lb

## 2018-05-29 DIAGNOSIS — R1110 Vomiting, unspecified: Secondary | ICD-10-CM

## 2018-05-29 DIAGNOSIS — I10 Essential (primary) hypertension: Secondary | ICD-10-CM | POA: Diagnosis not present

## 2018-05-29 DIAGNOSIS — E782 Mixed hyperlipidemia: Secondary | ICD-10-CM | POA: Diagnosis not present

## 2018-05-29 DIAGNOSIS — R0789 Other chest pain: Secondary | ICD-10-CM | POA: Diagnosis not present

## 2018-05-29 DIAGNOSIS — E119 Type 2 diabetes mellitus without complications: Secondary | ICD-10-CM

## 2018-05-29 MED ORDER — BLOOD GLUCOSE MONITOR SYSTEM W/DEVICE KIT: 1.0000 | PACK | Freq: Two times a day (BID) | 0 refills | Status: DC

## 2018-05-29 NOTE — Progress Notes (Signed)
Subjective:  Patient ID: Jared Norman, male    DOB: 11/25/1940  Age: 78 y.o. MRN: [id number]  CC: Hospitalization Follow-up   HPI Jared Norman presents for patient presented to the emergency department at [facility] on January 1.  He was having a crushing substernal pain.  MI ruled out via troponins.  Gallbladder ultrasound found that he did have cholelithiasis and on January 3 he underwent cholecystectomy.  Since that time he notes some improvement but still he is having significant substernal tightness.  Sometimes related to meals sometimes not.  It is episodic.  It is nonradiating.  It is associated with vomiting.  Pain can last anywhere from 3 to 20 minutes.  It is frequently relieved by a episode of vomiting.  Risk factors for cardiac disease include male age 36, diabetes, hypertension, hyperlipidemia.  Family history is negative.  He is a non-smoker.  Depression screen [facility] 2/9 05/26/2018 03/02/2018 01/21/2018  Decreased Interest 0 0 0  Down, Depressed, Hopeless 0 0 0  PHQ - 2 Score 0 0 0    History Jared Norman has a past medical history of BPH (benign prostatic hyperplasia), Cataract, Diabetes mellitus without complication ([name]), Hyperlipidemia, Hypertension, Kidney stones, and Vitamin D deficiency.   He has a past surgical history that includes right foot surgery (Right, 2008); Cystoscopy with retrograde pyelogram, ureteroscopy and stent placement (Left, 12/20/2013); and Eye surgery.   His family history includes Cancer in his father; Hypertension in his brother.He reports that he has never smoked. He has never used smokeless tobacco. He reports that he does not drink alcohol or use drugs.    ROS Review of Systems  Constitutional: Negative.   HENT: Negative.   Eyes: Negative for visual disturbance.  Respiratory: Negative for cough and shortness of breath.   Cardiovascular: Positive for chest pain. Negative for leg swelling.  Gastrointestinal: Negative for abdominal  pain, diarrhea, nausea and vomiting.  Genitourinary: Negative for difficulty urinating.  Musculoskeletal: Negative for arthralgias and myalgias.  Skin: Negative for rash.  Neurological: Negative for headaches.  Psychiatric/Behavioral: Negative for sleep disturbance.    Objective:  BP 132/80   Pulse 75   Temp 98.4 F (36.9 C) (Oral)   Ht '5\' 5"'  (1.651 m)   Wt 171 lb (77.6 kg)   BMI 28.46 kg/m   BP Readings from Last 3 Encounters:  05/29/18 132/80  05/26/18 107/71  03/02/18 123/68    Wt Readings from Last 3 Encounters:  05/29/18 171 lb (77.6 kg)  05/26/18 168 lb 6.4 oz (76.4 kg)  03/02/18 175 lb 9.6 oz (79.7 kg)     Physical Exam Vitals signs reviewed.  Constitutional:      Appearance: He is well-developed.  HENT:     Head: Normocephalic and atraumatic.     Mouth/Throat:     Pharynx: No oropharyngeal exudate or posterior oropharyngeal erythema.  Neck:     Musculoskeletal: Normal range of motion.  Cardiovascular:     Rate and Rhythm: Normal rate and regular rhythm.     Heart sounds: No murmur.  Pulmonary:     Effort: No respiratory distress.     Breath sounds: Normal breath sounds.  Musculoskeletal: Normal range of motion.  Skin:    General: Skin is warm and dry.  Neurological:     Mental Status: He is alert and oriented to person, place, and time.       Assessment & Plan:   Jared Norman was seen today for hospitalization follow-up.  Diagnoses  and all orders for this visit:  Chest tightness -     Ambulatory referral to Cardiology -     Ambulatory referral to Gastroenterology  Diabetes mellitus without complication ([place])  Essential hypertension  Mixed hyperlipidemia  Non-intractable vomiting, presence of nausea not specified, unspecified vomiting type -     Ambulatory referral to Gastroenterology       I am having Jared Norman maintain his magnesium gluconate, cholecalciferol, aspirin EC, EASY TOUCH LANCETS 26G, glucose blood, chlorthalidone,  blood glucose meter kit and supplies, simvastatin, metFORMIN, omeprazole, meloxicam, losartan, finasteride, amLODipine, lisinopril, pantoprazole, and sucralfate.  Allergies as of 05/29/2018   No Known Allergies     Medication List       Accurate as of May 29, 2018 12:13 PM. Always use your most recent med list.        amLODipine 5 MG tablet Commonly known as:  NORVASC Take 1 tablet (5 mg total) by mouth daily.   aspirin EC 81 MG tablet Take 81 mg by mouth every Monday, Wednesday, and Friday.   blood glucose meter kit and supplies Kit Dispense based on patient and insurance preference. Use up to four times daily as directed. (FOR ICD-9 250.00, 250.01).   chlorthalidone 25 MG tablet Commonly known as:  HYGROTON Take 1 tablet (25 mg total) by mouth every morning.   cholecalciferol 1000 units tablet Commonly known as:  VITAMIN D Take 2,000 Units by mouth daily.   EASY TOUCH LANCETS 26G Misc Use to check BS daily DX E11.9   finasteride 5 MG tablet Commonly known as:  PROSCAR Take 1 tablet (5 mg total) by mouth daily.   glucose blood test strip Test glucose up to 4 times daily   lisinopril 40 MG tablet Commonly known as:  PRINIVIL,ZESTRIL Take by mouth.   losartan 100 MG tablet Commonly known as:  COZAAR Take 1 tablet (100 mg total) by mouth daily.   magnesium gluconate 500 MG tablet Commonly known as:  MAGONATE Take 1,000 mg by mouth daily.   meloxicam 15 MG tablet Commonly known as:  MOBIC Take 1 tablet (15 mg total) by mouth daily.   metFORMIN 1000 MG tablet Commonly known as:  GLUCOPHAGE TAKE ONE TABLET BY MOUTH ONCE DAILY WITH BREAKFAST   omeprazole 40 MG capsule Commonly known as:  PRILOSEC Take 1 capsule (40 mg total) by mouth daily.   pantoprazole 40 MG tablet Commonly known as:  PROTONIX Take 1 tablet (40 mg total) by mouth daily.   simvastatin 40 MG tablet Commonly known as:  ZOCOR Take 0.5 tablets (20 mg total) by mouth at bedtime.     sucralfate 1 GM/10ML suspension Commonly known as:  CARAFATE Take 10 mLs (1 g total) by mouth 4 (four) times daily -  with meals and at bedtime.      Due to his significant risk factors for cardiac disease and the presence of a right bundle branch block on his EKG, he needs to see cardiology for his cardiac condition.  Should that not render a definitive cause of his chest tightness, he will need to see GI for rule out of esophageal stricture.    Follow-up: Return in about 1 month (around 06/29/2018).  Claretta Fraise, M.D.

## 2018-05-29 NOTE — Addendum Note (Signed)
Addended by: Marylin Crosby on: 05/29/2018 01:58 PM   Modules accepted: Orders

## 2018-06-01 ENCOUNTER — Telehealth: Payer: Self-pay | Admitting: Family Medicine

## 2018-06-01 ENCOUNTER — Encounter: Payer: Self-pay | Admitting: Gastroenterology

## 2018-06-01 ENCOUNTER — Ambulatory Visit: Payer: PPO | Admitting: Family Medicine

## 2018-06-01 DIAGNOSIS — K811 Chronic cholecystitis: Secondary | ICD-10-CM | POA: Insufficient documentation

## 2018-06-01 DIAGNOSIS — R079 Chest pain, unspecified: Secondary | ICD-10-CM | POA: Insufficient documentation

## 2018-06-01 MED ORDER — EASY TOUCH LANCETS 26G MISC: 3 refills | Status: DC

## 2018-06-01 MED ORDER — GLUCOSE BLOOD VI STRP: ORAL_STRIP | 12 refills | Status: DC

## 2018-06-01 NOTE — Telephone Encounter (Signed)
What is the name of the medication? Lancets and strips  Have you contacted your pharmacy to request a refill? No needs new rx  Which pharmacy would you like this sent to? [facility]. Pt was here 05/29/2018 to see [facility]   Patient notified that their request is being sent to the clinical staff for review and that they should receive a call once it is complete. If they do not receive a call within 24 hours they can check with their pharmacy or our office.

## 2018-06-01 NOTE — Telephone Encounter (Signed)
Pt aware rx sent over for lancets and test strips.

## 2018-06-02 ENCOUNTER — Encounter (INDEPENDENT_AMBULATORY_CARE_PROVIDER_SITE_OTHER): Payer: PPO | Admitting: Ophthalmology

## 2018-06-02 DIAGNOSIS — E11311 Type 2 diabetes mellitus with unspecified diabetic retinopathy with macular edema: Secondary | ICD-10-CM | POA: Diagnosis not present

## 2018-06-02 DIAGNOSIS — I10 Essential (primary) hypertension: Secondary | ICD-10-CM

## 2018-06-02 DIAGNOSIS — E113513 Type 2 diabetes mellitus with proliferative diabetic retinopathy with macular edema, bilateral: Secondary | ICD-10-CM | POA: Diagnosis not present

## 2018-06-02 DIAGNOSIS — H35033 Hypertensive retinopathy, bilateral: Secondary | ICD-10-CM

## 2018-06-02 DIAGNOSIS — H43813 Vitreous degeneration, bilateral: Secondary | ICD-10-CM

## 2018-06-04 ENCOUNTER — Encounter: Payer: Self-pay | Admitting: Cardiology

## 2018-06-04 ENCOUNTER — Ambulatory Visit: Payer: PPO | Admitting: Cardiology

## 2018-06-04 VITALS — BP 146/76 | HR 84 | Ht 63.5 in | Wt 174.0 lb

## 2018-06-04 DIAGNOSIS — I10 Essential (primary) hypertension: Secondary | ICD-10-CM

## 2018-06-04 DIAGNOSIS — E119 Type 2 diabetes mellitus without complications: Secondary | ICD-10-CM

## 2018-06-04 DIAGNOSIS — R079 Chest pain, unspecified: Secondary | ICD-10-CM | POA: Diagnosis not present

## 2018-06-04 DIAGNOSIS — E782 Mixed hyperlipidemia: Secondary | ICD-10-CM

## 2018-06-04 DIAGNOSIS — R0602 Shortness of breath: Secondary | ICD-10-CM | POA: Diagnosis not present

## 2018-06-04 DIAGNOSIS — K219 Gastro-esophageal reflux disease without esophagitis: Secondary | ICD-10-CM | POA: Diagnosis not present

## 2018-06-04 DIAGNOSIS — R072 Precordial pain: Secondary | ICD-10-CM | POA: Diagnosis not present

## 2018-06-04 NOTE — Patient Instructions (Signed)
Medication Instructions:  Your physician recommends that you continue on your current medications as directed. Please refer to the Current Medication list given to you today.  If you need a refill on your cardiac medications before your next appointment, please call your pharmacy.   Lab work: None today If you have labs (blood work) drawn today and your tests are completely normal, you will receive your results only by: Marland Kitchen MyChart Message (if you have MyChart) OR . A paper copy in the mail If you have any lab test that is abnormal or we need to change your treatment, we will call you to review the results.  Testing/Procedures: Your physician has requested that you have an echocardiogram. Echocardiography is a painless test that uses sound waves to create images of your heart. It provides your doctor with information about the size and shape of your heart and how well your heart's chambers and valves are working. This procedure takes approximately one hour. There are no restrictions for this procedure.  Your physician has requested that you have a lexiscan myoview. For further information please visit [email or web address]. Please follow instruction sheet, as given.    Follow-Up: We will call you with tests results, follow up pending tests.  Any Other Special Instructions Will Be Listed Below (If Applicable). none

## 2018-06-04 NOTE — Progress Notes (Signed)
Cardiology Office Note  Date: 06/04/2018   ID: Jared Norman, DOB 02-05-1941, MRN [id number]  PCP: Claretta Fraise, MD  Consulting Cardiologist: Rozann Lesches, MD   Chief Complaint  Patient presents with  . Chest Pain    History of Present Illness: Jared Norman is a 78 y.o. male referred for cardiology consultation by Dr. Livia Snellen for evaluation of chest pain.  Records indicate evaluation at [facility] in early January with substernal chest pain, negative troponin levels.  He was diagnosed with cholelithiasis by abdominal ultrasound and underwent laparoscopic cholecystectomy with Dr. Ladona Horns on January 3.  Since that time he has continued to have intermittent episodes of chest discomfort, also nausea and vomiting.  He is here with his wife today (a former Marine scientist).  Symptom onset was late December 2019, fairly intense chest pressure without exertional precipitant. He did correlate this with meals to some degree, a sense of indigestion and at times dysphasia.  Emesis would actually help relieve his symptoms.  Not entirely clear how much impact cholecystectomy made, he has noticed significant improvement since switching from Prilosec to Protonix.  Cardiac risk factors include age and gender, type 2 diabetes mellitus, hypertension, and hyperlipidemia.  I reviewed his ECG.  He has not undergone any previous objective ischemic testing.  He has a pending gastroenterology consultation as well.  He is a Health visitor.  Past Medical History:  Diagnosis Date  . BPH (benign prostatic hyperplasia)   . Cataract   . Hyperlipidemia   . Hypertension   . Kidney stones   . Type 2 diabetes mellitus ([place])   . Vitamin D deficiency     Past Surgical History:  Procedure Laterality Date  . CYSTOSCOPY WITH RETROGRADE PYELOGRAM, URETEROSCOPY AND STENT PLACEMENT Left 12/20/2013   Procedure: CYSTOSCOPY WITHLEFT RETROGRADE PYELOGRAM,  AND LEFT STENT PLACEMENT;  Surgeon: Malka So,  MD;  Location: [facility];  Service: Urology;  Laterality: Left;  . EYE SURGERY    . FOOT SURGERY Right 2008   Tendon rupture  . LAPAROSCOPIC CHOLECYSTECTOMY  05/2018   Dr. Ladona Horns    Current Outpatient Medications  Medication Sig Dispense Refill  . amLODipine (NORVASC) 5 MG tablet Take 1 tablet (5 mg total) by mouth daily. 90 tablet 1  . aspirin EC 81 MG tablet Take 81 mg by mouth every Monday, Wednesday, and Friday.    . blood glucose meter kit and supplies KIT Dispense based on patient and insurance preference. Use up to four times daily as directed. (FOR ICD-9 250.00, 250.01). 1 each 0  . Blood Glucose Monitoring Suppl (BLOOD GLUCOSE MONITOR SYSTEM) w/Device KIT 1 Device by Does not apply route 2 (two) times daily. 1 each 0  . chlorthalidone (HYGROTON) 25 MG tablet Take 1 tablet (25 mg total) by mouth every morning. 90 tablet 1  . cholecalciferol (VITAMIN D) 1000 UNITS tablet Take 2,000 Units by mouth daily.    Marland Kitchen EASY TOUCH LANCETS 26G MISC Use to check BS daily DX E11.9 100 each 3  . finasteride (PROSCAR) 5 MG tablet Take 1 tablet (5 mg total) by mouth daily. 90 tablet 1  . glucose blood test strip Test glucose up to 4 times daily 100 each 12  . losartan (COZAAR) 100 MG tablet Take 1 tablet (100 mg total) by mouth daily. 90 tablet 1  . magnesium gluconate (MAGONATE) 500 MG tablet Take 1,000 mg by mouth daily.     . meloxicam (MOBIC) 15 MG tablet Take  1 tablet (15 mg total) by mouth daily. 90 tablet 1  . metFORMIN (GLUCOPHAGE) 1000 MG tablet TAKE ONE TABLET BY MOUTH ONCE DAILY WITH BREAKFAST 90 tablet 1  . pantoprazole (PROTONIX) 40 MG tablet Take 1 tablet (40 mg total) by mouth daily. (Patient taking differently: Take 40 mg by mouth 2 (two) times daily. ) 30 tablet 3  . simvastatin (ZOCOR) 40 MG tablet Take 0.5 tablets (20 mg total) by mouth at bedtime. 90 tablet 1   No current facility-administered medications for this visit.    Allergies:  Patient has no known allergies.   Social  History: The patient  reports that he has never smoked. He has never used smokeless tobacco. He reports that he does not drink alcohol or use drugs.   Family History: The patient's family history includes Cancer in his father; Hypertension in his brother.   ROS:  Please see the history of present illness. Otherwise, complete review of systems is positive for hearing loss.  All other systems are reviewed and negative.   Physical Exam: VS:  BP (!) 146/76 (BP Location: Right Arm)   Pulse 84   Ht 5' 3.5" (1.613 m)   Wt 174 lb (78.9 kg)   SpO2 98%   BMI 30.34 kg/m , BMI Body mass index is 30.34 kg/m.  Wt Readings from Last 3 Encounters:  06/04/18 174 lb (78.9 kg)  05/29/18 171 lb (77.6 kg)  05/26/18 168 lb 6.4 oz (76.4 kg)    General: Elderly male, appears comfortable at rest. HEENT: Conjunctiva and lids normal, oropharynx clear. Neck: Supple, no elevated JVP or carotid bruits, no thyromegaly. Lungs: Clear to auscultation, nonlabored breathing at rest. Cardiac: Regular rate and rhythm, no S3 or significant systolic murmur, no pericardial rub. Abdomen: Soft, nontender, bowel sounds present. Extremities: No pitting edema, distal pulses 2+. Skin: Warm and dry. Musculoskeletal: No kyphosis. Neuropsychiatric: Alert and oriented x3, affect grossly appropriate.  ECG: I personally reviewed the tracing from 05/26/2018 which showed sinus rhythm with right bundle branch block.  Recent Labwork: 05/26/2018: ALT 26; AST 16; BUN 23; Creatinine, Ser 1.64; Hemoglobin 13.2; Platelets 366; Potassium 4.1; Sodium 141     Component Value Date/Time   CHOL 162 11/26/2017 0932   TRIG 155 (H) 11/26/2017 0932   TRIG 119 06/01/2013 1101   HDL 49 11/26/2017 0932   HDL 45 06/01/2013 1101   CHOLHDL 3.3 11/26/2017 0932   CHOLHDL 3.5 04/19/2014 1042   VLDL 15 04/19/2014 1042   LDLCALC 82 11/26/2017 0932   LDLCALC 68 06/01/2013 1101   Assessment and Plan:  1.  Recurrent chest pain as outlined above.   Description is most consistent with gastrointestinal etiology as there are components of dysphasia, indigestion, and emesis.  Symptoms have not been exertional in nature.  Having said that he does have significant cardiac risk factors and has not undergone any previous ischemic testing.  It is reassuring to note that troponin levels were negative after a prolonged episode of chest pain during his original evaluation.  ECG shows sinus rhythm with a right bundle branch block.  Plan is to obtain objective cardiac evaluation, echocardiogram to assess cardiac structure and function, and a Lexiscan Myoview to evaluate for ischemia.  Changes were made in current medications.  2.  Essential hypertension, on Norvasc and Cozaar.  Follows with Dr. Livia Snellen.  3.  Type 2 diabetes mellitus, Glucophage.  Follows with Dr. Livia Snellen.  4.  History of reflux, switched from Prilosec to Protonix recently with improvement  in above symptoms.  5.  Mixed hyperlipidemia on Zocor.  Recent LDL 82.  Current medicines were reviewed with the patient today.   Orders Placed This Encounter  Procedures  . NM Myocar Multi W/Spect W/Wall Motion / EF  . ECHOCARDIOGRAM COMPLETE    Disposition: Call with test results.  Signed, Satira Sark, MD, [facility] 06/04/2018 9:24 AM    [facility]. [address] [place], [place] [address] Phone: [phone]; Fax: [phone]

## 2018-06-09 ENCOUNTER — Encounter: Payer: Self-pay | Admitting: Internal Medicine

## 2018-06-09 ENCOUNTER — Other Ambulatory Visit: Payer: Self-pay | Admitting: Family Medicine

## 2018-06-09 ENCOUNTER — Telehealth: Payer: Self-pay | Admitting: Family Medicine

## 2018-06-09 DIAGNOSIS — R1013 Epigastric pain: Secondary | ICD-10-CM

## 2018-06-09 MED ORDER — PANTOPRAZOLE SODIUM 40 MG PO TBEC: 40.0000 mg | DELAYED_RELEASE_TABLET | Freq: Two times a day (BID) | ORAL | 5 refills | Status: DC

## 2018-06-09 NOTE — Telephone Encounter (Signed)
I sent in the requested prescription 

## 2018-06-09 NOTE — Telephone Encounter (Signed)
What is the name of the medication? Jared Norman 40 mg Patient was told by Dr. Livia Snellen he could take 2 a day and he is running out. Has GI appt 2-4 for consult and needs a refill stating he take two a day  Have you contacted your pharmacy to request a refill? Yes  Which pharmacy would you like this sent to? [facility] in [place]   Patient notified that their request is being sent to the clinical staff for review and that they should receive a call once it is complete. If they do not receive a call within 24 hours they can check with their pharmacy or our office.

## 2018-06-09 NOTE — Telephone Encounter (Signed)
Please advise 

## 2018-06-09 NOTE — Telephone Encounter (Signed)
Pt aware Rx sent to pharmacy 

## 2018-06-10 ENCOUNTER — Encounter: Payer: Self-pay | Admitting: Nurse Practitioner

## 2018-06-15 ENCOUNTER — Ambulatory Visit (HOSPITAL_COMMUNITY)
Admission: RE | Admit: 2018-06-15 | Discharge: 2018-06-15 | Disposition: A | Discharge status: Home / Self Care | Payer: PPO | Source: Ambulatory Visit | Attending: Cardiology | Admitting: Cardiology

## 2018-06-15 ENCOUNTER — Encounter (HOSPITAL_BASED_OUTPATIENT_CLINIC_OR_DEPARTMENT_OTHER)
Admission: RE | Admit: 2018-06-15 | Discharge: 2018-06-15 | Disposition: A | Discharge status: Home / Self Care | Payer: PPO | Source: Ambulatory Visit | Attending: Cardiology | Admitting: Cardiology

## 2018-06-15 ENCOUNTER — Encounter (HOSPITAL_COMMUNITY): Payer: Self-pay

## 2018-06-15 ENCOUNTER — Encounter (HOSPITAL_COMMUNITY)
Admission: RE | Admit: 2018-06-15 | Discharge: 2018-06-15 | Disposition: A | Discharge status: Home / Self Care | Payer: PPO | Source: Ambulatory Visit | Attending: Cardiology | Admitting: Cardiology

## 2018-06-15 DIAGNOSIS — R0602 Shortness of breath: Secondary | ICD-10-CM | POA: Insufficient documentation

## 2018-06-15 DIAGNOSIS — R079 Chest pain, unspecified: Secondary | ICD-10-CM | POA: Insufficient documentation

## 2018-06-15 LAB — NM MYOCAR MULTI W/SPECT W/WALL MOTION / EF
LV dias vol: 58 mL (ref 62–150)
LV sys vol: 17 mL
Peak HR: 91 {beats}/min
RATE: 0.28
Rest HR: 74 {beats}/min
SDS: 1
SRS: 0
SSS: 1
TID: 0.82

## 2018-06-15 MED ORDER — SODIUM CHLORIDE 0.9% FLUSH
INTRAVENOUS | Status: AC
  Administered 2018-06-15: 10 mL via INTRAVENOUS
  Filled 2018-06-15: qty 10

## 2018-06-15 MED ORDER — TECHNETIUM TC 99M TETROFOSMIN IV KIT
10.0000 | PACK | Freq: Once | INTRAVENOUS | Status: AC | PRN
  Administered 2018-06-15: 11 via INTRAVENOUS

## 2018-06-15 MED ORDER — TECHNETIUM TC 99M TETROFOSMIN IV KIT
30.0000 | PACK | Freq: Once | INTRAVENOUS | Status: AC | PRN
  Administered 2018-06-15: 29 via INTRAVENOUS

## 2018-06-15 MED ORDER — REGADENOSON 0.4 MG/5ML IV SOLN
INTRAVENOUS | Status: AC
  Administered 2018-06-15: 0.4 mg via INTRAVENOUS
  Filled 2018-06-15: qty 5

## 2018-06-15 NOTE — Progress Notes (Signed)
*  PRELIMINARY RESULTS* Echocardiogram 2D Echocardiogram has been performed.  Jared Norman 06/15/2018, 11:51 AM

## 2018-06-16 ENCOUNTER — Encounter: Payer: Self-pay | Admitting: Gastroenterology

## 2018-06-16 ENCOUNTER — Ambulatory Visit (INDEPENDENT_AMBULATORY_CARE_PROVIDER_SITE_OTHER): Payer: PPO | Admitting: Gastroenterology

## 2018-06-16 VITALS — BP 110/70 | HR 84 | Ht 63.75 in | Wt 177.2 lb

## 2018-06-16 DIAGNOSIS — R079 Chest pain, unspecified: Secondary | ICD-10-CM | POA: Diagnosis not present

## 2018-06-16 DIAGNOSIS — R1310 Dysphagia, unspecified: Secondary | ICD-10-CM

## 2018-06-16 DIAGNOSIS — K219 Gastro-esophageal reflux disease without esophagitis: Secondary | ICD-10-CM | POA: Diagnosis not present

## 2018-06-16 DIAGNOSIS — R112 Nausea with vomiting, unspecified: Secondary | ICD-10-CM

## 2018-06-16 NOTE — Progress Notes (Signed)
History of Present Illness: This is a 78 year old male referred by Sharion Balloon, FNP for the evaluation of chest pain, nausea and vomiting.  He is accompanied by his wife who provides a significant portion of the history.  He relates a history of reflux symptoms treated with omeprazole daily for several years.  He relates intermittent solid food dysphagia which was active about a year or so ago however much less active recently.  He developed worsening episodes of with severe chest pain associated with nausea and vomiting that led to hospitalization at [facility] on New Year's Day.  Unfortunately no records available from [facility].  Apparently troponins were negative and EKG showed a stable RBBB - cardiac etiologies were felt to be excluded.  He underwent laparoscopic cholecystectomy on 05/15/2018.  He was discharged 3 days later.  He has had a follow-up appointment with his surgeon and is doing well from a postop standpoint.  He had recurrent problems with chest pain and nausea following discharge from the hospital and he was changed to pantoprazole 40 mg twice daily along with Carafate.  Carafate was stopped after several days. His chest pain, nausea and vomiting have not recurred since starting pantoprazole twice daily.  He relates a few episodes of pain with swallowing during his hospitalization and shortly after.  He underwent cardiac evaluation including an echocardiogram and a stress test.  Ejection fraction 65% and no abnormalities to suggest ischemia noted.  He denies weight loss, abdominal pain, constipation, diarrhea, change in stool caliber, melena, hematochezia.   No Known Allergies Outpatient Medications Prior to Visit  Medication Sig Dispense Refill  . amLODipine (NORVASC) 5 MG tablet Take 1 tablet (5 mg total) by mouth daily. 90 tablet 1  . aspirin EC 81 MG tablet Take 81 mg by mouth every Monday, Wednesday, and Friday.    . blood glucose meter kit and supplies KIT Dispense  based on patient and insurance preference. Use up to four times daily as directed. (FOR ICD-9 250.00, 250.01). 1 each 0  . Blood Glucose Monitoring Suppl (BLOOD GLUCOSE MONITOR SYSTEM) w/Device KIT 1 Device by Does not apply route 2 (two) times daily. 1 each 0  . chlorthalidone (HYGROTON) 25 MG tablet Take 1 tablet (25 mg total) by mouth every morning. 90 tablet 1  . cholecalciferol (VITAMIN D) 1000 UNITS tablet Take 2,000 Units by mouth daily.    Marland Kitchen EASY TOUCH LANCETS 26G MISC Use to check BS daily DX E11.9 100 each 3  . finasteride (PROSCAR) 5 MG tablet Take 1 tablet (5 mg total) by mouth daily. 90 tablet 1  . glucose blood test strip Test glucose up to 4 times daily 100 each 12  . losartan (COZAAR) 100 MG tablet Take 1 tablet (100 mg total) by mouth daily. 90 tablet 1  . magnesium gluconate (MAGONATE) 500 MG tablet Take 1,000 mg by mouth daily.     . meloxicam (MOBIC) 15 MG tablet Take 1 tablet (15 mg total) by mouth daily. 90 tablet 1  . metFORMIN (GLUCOPHAGE) 1000 MG tablet TAKE ONE TABLET BY MOUTH ONCE DAILY WITH BREAKFAST 90 tablet 1  . pantoprazole (PROTONIX) 40 MG tablet Take 1 tablet (40 mg total) by mouth 2 (two) times daily. 60 tablet 5  . simvastatin (ZOCOR) 40 MG tablet Take 0.5 tablets (20 mg total) by mouth at bedtime. 90 tablet 1   No facility-administered medications prior to visit.    Past Medical History:  Diagnosis Date  . Arthritis   .  BPH (benign prostatic hyperplasia)   . Cataract   . Gallstones   . Hyperlipidemia   . Hypertension   . Kidney stones   . Type 2 diabetes mellitus ([place])   . Vitamin D deficiency    Past Surgical History:  Procedure Laterality Date  . CATARACT EXTRACTION, BILATERAL    . CYSTOSCOPY WITH RETROGRADE PYELOGRAM, URETEROSCOPY AND STENT PLACEMENT Left 12/20/2013   Procedure: CYSTOSCOPY WITHLEFT RETROGRADE PYELOGRAM,  AND LEFT STENT PLACEMENT;  Surgeon: Malka So, MD;  Location: [facility];  Service: Urology;  Laterality: Left;  . FOOT SURGERY  Right 2008   Tendon rupture  . LAPAROSCOPIC CHOLECYSTECTOMY  05/2018   Dr. Ladona Horns   Social History   Socioeconomic History  . Marital status: Married    Spouse name: Marlowe Kays  . Number of children: 7  . Years of education: 12th grade  . Highest education level: High school graduate  Occupational History  . Occupation: Company secretary  . Occupation: [employer]  . Financial resource strain: Not hard at all  . Food insecurity:    Worry: Never true    Inability: Never true  . Transportation needs:    Medical: No    Non-medical: No  Tobacco Use  . Smoking status: Never Smoker  . Smokeless tobacco: Never Used  Substance and Sexual Activity  . Alcohol use: No  . Drug use: No  . Sexual activity: Not Currently  Lifestyle  . Physical activity:    Days per week: 0 days    Minutes per session: 0 min  . Stress: Not at all  Relationships  . Social connections:    Talks on phone: More than three times a week    Gets together: More than three times a week    Attends religious service: More than 4 times per year    Active member of club or organization: Yes    Attends meetings of clubs or organizations: More than 4 times per year    Relationship status: Married  Other Topics Concern  . Not on file  Social History Narrative  . Not on file   Family History  Problem Relation Age of Onset  . ALS Brother        Agent Orange  . Hypertension Brother   . Diabetes Brother   . Bladder Cancer Father   . Blindness Father   . Blindness Paternal Grandfather        Review of Systems: Pertinent positive and negative review of systems were noted in the above HPI section. All other review of systems were otherwise negative.    Physical Exam: General: Well developed, well nourished, no acute distress Head: Normocephalic and atraumatic Eyes:  sclerae anicteric, EOMI Ears: Normal auditory acuity Mouth: No deformity or lesions Neck: Supple, no masses or thyromegaly Lungs: Clear  throughout to auscultation Heart: Regular rate and rhythm; no murmurs, rubs or bruits Abdomen: Soft, non tender and non distended. No masses, hepatosplenomegaly or hernias noted. Normal Bowel sounds Rectal: Not done Musculoskeletal: Symmetrical with no gross deformities  Skin: No lesions on visible extremities Pulses:  Normal pulses noted Extremities: No clubbing, cyanosis, edema or deformities noted Neurological: Alert oriented x 4, grossly nonfocal Cervical Nodes:  No significant cervical adenopathy Inguinal Nodes: No significant inguinal adenopathy Psychological:  Alert and cooperative. Normal mood and affect   Assessment and Recommendations:  1. Chest pain, N/V, dysphagia/odynophagia. R/O GERD, esophagitis, stricture, ulcer, neoplasm.  Continue pantoprazole 40 mg twice daily.  Follow standard  antireflux measures.  Schedule EGD with possible dilation. The risks (including bleeding, perforation, infection, missed lesions, medication reactions and possible hospitalization or surgery if complications occur), benefits, and alternatives to endoscopy with possible biopsy and possible dilation were discussed with the patient and they consent to proceed.   2.  Status post laparoscopic cholecystectomy on 05/15/2018.  3.  CRC screening, average risk.  We will further discuss screening options with him after his acute problems are under good control.   cc: Sharion Balloon, [place] [address] [place], [place] [address]

## 2018-06-16 NOTE — Patient Instructions (Signed)
You have been scheduled for an endoscopy. Please follow written instructions given to you at your visit today. If you use inhalers (even only as needed), please bring them with you on the day of your procedure. Your physician has requested that you go to www.startemmi.com and enter the access code given to you at your visit today. This web site gives a general overview about your procedure. However, you should still follow specific instructions given to you by our office regarding your preparation for the procedure.  Patient advised to avoid spicy, acidic, citrus, chocolate, mints, fruit and fruit juices.  Limit the intake of caffeine, alcohol and Soda.  Don't exercise too soon after eating.  Don't lie down within 3-4 hours of eating.  Elevate the head of your bed.  Normal BMI (Body Mass Index- based on height and weight) is between 23 and 30. Your BMI today is Body mass index is 30.66 kg/m. Marland Kitchen Please consider follow up  regarding your BMI with your Primary Care Provider.  Thank you for choosing me and [facility].  Pricilla Riffle. Dagoberto Ligas., MD., [facility]

## 2018-06-24 ENCOUNTER — Ambulatory Visit (AMBULATORY_SURGERY_CENTER): Payer: PPO | Admitting: Gastroenterology

## 2018-06-24 ENCOUNTER — Encounter: Payer: Self-pay | Admitting: Gastroenterology

## 2018-06-24 VITALS — BP 153/88 | HR 80 | Temp 97.1°F | Resp 10 | Ht 63.0 in | Wt 177.0 lb

## 2018-06-24 DIAGNOSIS — I10 Essential (primary) hypertension: Secondary | ICD-10-CM | POA: Diagnosis not present

## 2018-06-24 DIAGNOSIS — R1310 Dysphagia, unspecified: Secondary | ICD-10-CM | POA: Diagnosis not present

## 2018-06-24 DIAGNOSIS — K449 Diaphragmatic hernia without obstruction or gangrene: Secondary | ICD-10-CM | POA: Diagnosis not present

## 2018-06-24 DIAGNOSIS — K219 Gastro-esophageal reflux disease without esophagitis: Secondary | ICD-10-CM | POA: Diagnosis not present

## 2018-06-24 DIAGNOSIS — R0789 Other chest pain: Secondary | ICD-10-CM

## 2018-06-24 DIAGNOSIS — K2970 Gastritis, unspecified, without bleeding: Secondary | ICD-10-CM

## 2018-06-24 DIAGNOSIS — E119 Type 2 diabetes mellitus without complications: Secondary | ICD-10-CM | POA: Diagnosis not present

## 2018-06-24 MED ORDER — SODIUM CHLORIDE 0.9 % IV SOLN: 500.0000 mL | Freq: Once | INTRAVENOUS | Status: DC

## 2018-06-24 NOTE — Progress Notes (Signed)
Report given to PACU, vss 

## 2018-06-24 NOTE — Progress Notes (Signed)
Pt's states no medical or surgical changes since previsit or office visit. 

## 2018-06-24 NOTE — Op Note (Signed)
[facility] Patient Name: Jared Norman Procedure Date: 06/24/2018 2:47 PM MRN: [id number] Endoscopist: Ladene Artist , MD Age: 78 Referring MD:  Date of Birth: [birth year] Gender: Male Account #: [id number] Procedure:                Upper GI endoscopy Indications:              Dysphagia, Odynophagia, Unexplained chest pain Medicines:                Monitored Anesthesia Care Procedure:                Pre-Anesthesia Assessment:                           - Prior to the procedure, a History and Physical                            was performed, and patient medications and                            allergies were reviewed. The patient's tolerance of                            previous anesthesia was also reviewed. The risks                            and benefits of the procedure and the sedation                            options and risks were discussed with the patient.                            All questions were answered, and informed consent                            was obtained. Prior Anticoagulants: The patient has                            taken no previous anticoagulant or antiplatelet                            agents. ASA Grade Assessment: II - A patient with                            mild systemic disease. After reviewing the risks                            and benefits, the patient was deemed in                            satisfactory condition to undergo the procedure.                           After obtaining informed consent, the endoscope was  passed under direct vision. Throughout the                            procedure, the patient's blood pressure, pulse, and                            oxygen saturations were monitored continuously. The                            Endoscope was introduced through the mouth, and                            advanced to the second part of duodenum. The upper                            GI  endoscopy was accomplished without difficulty.                            The patient tolerated the procedure well. Scope In: Scope Out: Findings:                 No endoscopic abnormality was evident in the                            esophagus to explain the patient's complaint of                            dysphagia. It was decided, however, to proceed with                            dilation of the entire esophagus. A guidewire was                            placed and the scope was withdrawn. Dilation was                            performed with a Savary dilator with no resistance                            at 17 mm.                           A small hiatal hernia was present.                           Patchy mildly erythematous mucosa without bleeding                            was found in the gastric body and in the gastric                            antrum. Biopsies were taken with a cold forceps for  histology.                           The exam of the stomach was otherwise normal.                           The duodenal bulb and second portion of the                            duodenum were normal. Complications:            No immediate complications. Estimated Blood Loss:     Estimated blood loss: none. Impression:               - No endoscopic esophageal abnormality to explain                            patient's dysphagia. Esophagus dilated.                           - Small hiatal hernia.                           - Erythematous mucosa in the gastric body and                            antrum. R/O gastritis. Biopsied.                           - Normal duodenal bulb and second portion of the                            duodenum. Recommendation:           - Patient has a contact number available for                            emergencies. The signs and symptoms of potential                            delayed complications were discussed with the                             patient. Return to normal activities tomorrow.                            Written discharge instructions were provided to the                            patient.                           - Clear liquid diet for 2 hours, then advance as                            tolerated to soft diet today.                           -  Resume prior diet tomorrow.                           - Follow antireflux measures long term.                           - Continue present medications including Protonix                            40 mg po bid for 1 month and then change to                            Protonix 40 mg po qam.                           - Return to primary care physician as previously                            scheduled for ongoing care. Ladene Artist, MD 06/24/2018 3:54:56 PM This report has been signed electronically.

## 2018-06-24 NOTE — Patient Instructions (Signed)
Handouts given on gastritis and post dilation diet.  Clear liquids for 2 hours then, advance to soft foods for the rest of today. Resume prior diet tomorrow. Follow antireflux regimen long-term. Continue prior meds but take PROTONIX 40MG  TWICE DAILY FOR ONE MONTH THEN TAKE ONCE DAILY IN THE MORNING.  ALWAYS TAKE 30MINUTES ON AN EMPTY STOMACH 30 MINUTES BEFORE YOUR NEXT MEAL. Dr Fuller Plan took biopsies today to test for a specific infection in your stomach lining (called Hpylori or helicobacter).  If this test comes back positive he will order you some medicines to "cure" this infection.    YOU HAD AN ENDOSCOPIC PROCEDURE TODAY AT [facility]:   Refer to the procedure report that was given to you for any specific questions about what was found during the examination.  If the procedure report does not answer your questions, please call your gastroenterologist to clarify.  If you requested that your care partner not be given the details of your procedure findings, then the procedure report has been included in a sealed envelope for you to review at your convenience later.  YOU SHOULD EXPECT: Some feelings of bloating in the abdomen. Passage of more gas than usual.  Walking can help get rid of the air that was put into your GI tract during the procedure and reduce the bloating. If you had a lower endoscopy (such as a colonoscopy or flexible sigmoidoscopy) you may notice spotting of blood in your stool or on the toilet paper. If you underwent a bowel prep for your procedure, you may not have a normal bowel movement for a few days.  Please Note:  You might notice some irritation and congestion in your nose or some drainage.  This is from the oxygen used during your procedure.  There is no need for concern and it should clear up in a day or so.  SYMPTOMS TO REPORT IMMEDIATELY:     Following upper endoscopy (EGD)  Vomiting of blood or coffee ground material  New chest pain or pain under the  shoulder blades  Painful or persistently difficult swallowing  New shortness of breath  Fever of 100F or higher  Black, tarry-looking stools  For urgent or emergent issues, a gastroenterologist can be reached at any hour by calling [phone].   DIET:  We do recommend a small meal at first, but then you may proceed to your regular diet.  Drink plenty of fluids but you should avoid alcoholic beverages for 24 hours.  ACTIVITY:  You should plan to take it easy for the rest of today and you should NOT DRIVE or use heavy machinery until tomorrow (because of the sedation medicines used during the test).    FOLLOW UP: Our staff will call the number listed on your records the next business day following your procedure to check on you and address any questions or concerns that you may have regarding the information given to you following your procedure. If we do not reach you, we will leave a message.  However, if you are feeling well and you are not experiencing any problems, there is no need to return our call.  We will assume that you have returned to your regular daily activities without incident.  If any biopsies were taken you will be contacted by phone or by letter within the next 1-3 weeks.  Please call us at [phone] if you have not heard about the biopsies in 3 weeks.    SIGNATURES/CONFIDENTIALITY: You and/or  your care partner have signed paperwork which will be entered into your electronic medical record.  These signatures attest to the fact that that the information above on your After Visit Summary has been reviewed and is understood.  Full responsibility of the confidentiality of this discharge information lies with you and/or your care-partner.

## 2018-06-24 NOTE — Progress Notes (Signed)
Called to room to assist during endoscopic procedure.  Patient ID and intended procedure confirmed with present staff. Received instructions for my participation in the procedure from the performing physician.  

## 2018-06-25 ENCOUNTER — Telehealth: Payer: Self-pay | Admitting: *Deleted

## 2018-06-25 NOTE — Telephone Encounter (Signed)
  Follow up Call-  Call back number 06/24/2018  Post procedure Call Back phone  # [phone]  Permission to leave phone message Yes  Some recent data might be hidden     Patient questions:  Do you have a fever, pain , or abdominal swelling? No. Pain Score  0 *  Have you tolerated food without any problems? Yes.    Have you been able to return to your normal activities? Yes.    Do you have any questions about your discharge instructions: Diet   No. Medications  No. Follow up visit  No.  Do you have questions or concerns about your Care? No.  Actions: * If pain score is 4 or above: No action needed, pain <4.

## 2018-06-30 ENCOUNTER — Encounter (INDEPENDENT_AMBULATORY_CARE_PROVIDER_SITE_OTHER): Payer: PPO | Admitting: Ophthalmology

## 2018-06-30 DIAGNOSIS — H35033 Hypertensive retinopathy, bilateral: Secondary | ICD-10-CM

## 2018-06-30 DIAGNOSIS — I10 Essential (primary) hypertension: Secondary | ICD-10-CM | POA: Diagnosis not present

## 2018-06-30 DIAGNOSIS — E113513 Type 2 diabetes mellitus with proliferative diabetic retinopathy with macular edema, bilateral: Secondary | ICD-10-CM | POA: Diagnosis not present

## 2018-06-30 DIAGNOSIS — H43813 Vitreous degeneration, bilateral: Secondary | ICD-10-CM | POA: Diagnosis not present

## 2018-06-30 DIAGNOSIS — E11311 Type 2 diabetes mellitus with unspecified diabetic retinopathy with macular edema: Secondary | ICD-10-CM

## 2018-07-16 ENCOUNTER — Encounter: Payer: Self-pay | Admitting: Gastroenterology

## 2018-07-27 ENCOUNTER — Other Ambulatory Visit: Payer: Self-pay

## 2018-07-27 ENCOUNTER — Encounter (INDEPENDENT_AMBULATORY_CARE_PROVIDER_SITE_OTHER): Payer: PPO | Admitting: Ophthalmology

## 2018-07-27 DIAGNOSIS — H35033 Hypertensive retinopathy, bilateral: Secondary | ICD-10-CM | POA: Diagnosis not present

## 2018-07-27 DIAGNOSIS — H43813 Vitreous degeneration, bilateral: Secondary | ICD-10-CM | POA: Diagnosis not present

## 2018-07-27 DIAGNOSIS — I10 Essential (primary) hypertension: Secondary | ICD-10-CM

## 2018-07-27 DIAGNOSIS — E113513 Type 2 diabetes mellitus with proliferative diabetic retinopathy with macular edema, bilateral: Secondary | ICD-10-CM

## 2018-07-27 DIAGNOSIS — E11311 Type 2 diabetes mellitus with unspecified diabetic retinopathy with macular edema: Secondary | ICD-10-CM

## 2018-08-12 ENCOUNTER — Ambulatory Visit: Payer: PPO | Admitting: Nurse Practitioner

## 2018-08-17 DIAGNOSIS — F329 Major depressive disorder, single episode, unspecified: Secondary | ICD-10-CM | POA: Diagnosis not present

## 2018-08-17 DIAGNOSIS — Z85828 Personal history of other malignant neoplasm of skin: Secondary | ICD-10-CM | POA: Diagnosis not present

## 2018-08-17 DIAGNOSIS — Z0000 Encounter for general adult medical examination without abnormal findings: Secondary | ICD-10-CM | POA: Diagnosis not present

## 2018-08-17 DIAGNOSIS — E039 Hypothyroidism, unspecified: Secondary | ICD-10-CM | POA: Diagnosis not present

## 2018-08-17 DIAGNOSIS — L570 Actinic keratosis: Secondary | ICD-10-CM | POA: Diagnosis not present

## 2018-08-17 DIAGNOSIS — M6281 Muscle weakness (generalized): Secondary | ICD-10-CM | POA: Diagnosis not present

## 2018-08-17 DIAGNOSIS — M5416 Radiculopathy, lumbar region: Secondary | ICD-10-CM | POA: Diagnosis not present

## 2018-08-17 DIAGNOSIS — L821 Other seborrheic keratosis: Secondary | ICD-10-CM | POA: Diagnosis not present

## 2018-08-17 DIAGNOSIS — D485 Neoplasm of uncertain behavior of skin: Secondary | ICD-10-CM | POA: Diagnosis not present

## 2018-08-17 DIAGNOSIS — Z6831 Body mass index (BMI) 31.0-31.9, adult: Secondary | ICD-10-CM | POA: Diagnosis not present

## 2018-08-17 DIAGNOSIS — Z7189 Other specified counseling: Secondary | ICD-10-CM | POA: Diagnosis not present

## 2018-08-17 DIAGNOSIS — M48061 Spinal stenosis, lumbar region without neurogenic claudication: Secondary | ICD-10-CM | POA: Diagnosis not present

## 2018-08-17 DIAGNOSIS — F419 Anxiety disorder, unspecified: Secondary | ICD-10-CM | POA: Diagnosis not present

## 2018-08-17 DIAGNOSIS — I10 Essential (primary) hypertension: Secondary | ICD-10-CM | POA: Diagnosis not present

## 2018-08-17 DIAGNOSIS — Z23 Encounter for immunization: Secondary | ICD-10-CM | POA: Diagnosis not present

## 2018-08-17 DIAGNOSIS — E538 Deficiency of other specified B group vitamins: Secondary | ICD-10-CM | POA: Diagnosis not present

## 2018-08-17 DIAGNOSIS — M25612 Stiffness of left shoulder, not elsewhere classified: Secondary | ICD-10-CM | POA: Diagnosis not present

## 2018-08-17 DIAGNOSIS — F5105 Insomnia due to other mental disorder: Secondary | ICD-10-CM | POA: Diagnosis not present

## 2018-08-17 DIAGNOSIS — M25512 Pain in left shoulder: Secondary | ICD-10-CM | POA: Diagnosis not present

## 2018-08-17 DIAGNOSIS — Z1321 Encounter for screening for nutritional disorder: Secondary | ICD-10-CM | POA: Diagnosis not present

## 2018-08-17 DIAGNOSIS — D649 Anemia, unspecified: Secondary | ICD-10-CM | POA: Diagnosis not present

## 2018-08-17 DIAGNOSIS — Z9889 Other specified postprocedural states: Secondary | ICD-10-CM | POA: Diagnosis not present

## 2018-08-19 ENCOUNTER — Other Ambulatory Visit: Payer: Self-pay | Admitting: Family Medicine

## 2018-08-20 ENCOUNTER — Ambulatory Visit: Payer: PPO | Admitting: Nurse Practitioner

## 2018-08-24 ENCOUNTER — Other Ambulatory Visit: Payer: Self-pay

## 2018-08-24 ENCOUNTER — Encounter (INDEPENDENT_AMBULATORY_CARE_PROVIDER_SITE_OTHER): Payer: PPO | Admitting: Ophthalmology

## 2018-08-24 DIAGNOSIS — H43813 Vitreous degeneration, bilateral: Secondary | ICD-10-CM | POA: Diagnosis not present

## 2018-08-24 DIAGNOSIS — M5136 Other intervertebral disc degeneration, lumbar region: Secondary | ICD-10-CM | POA: Diagnosis not present

## 2018-08-24 DIAGNOSIS — Z124 Encounter for screening for malignant neoplasm of cervix: Secondary | ICD-10-CM | POA: Diagnosis not present

## 2018-08-24 DIAGNOSIS — E113513 Type 2 diabetes mellitus with proliferative diabetic retinopathy with macular edema, bilateral: Secondary | ICD-10-CM

## 2018-08-24 DIAGNOSIS — M461 Sacroiliitis, not elsewhere classified: Secondary | ICD-10-CM | POA: Diagnosis not present

## 2018-08-24 DIAGNOSIS — E11311 Type 2 diabetes mellitus with unspecified diabetic retinopathy with macular edema: Secondary | ICD-10-CM

## 2018-08-24 DIAGNOSIS — R87610 Atypical squamous cells of undetermined significance on cytologic smear of cervix (ASC-US): Secondary | ICD-10-CM | POA: Diagnosis not present

## 2018-08-24 DIAGNOSIS — M2560 Stiffness of unspecified joint, not elsewhere classified: Secondary | ICD-10-CM | POA: Diagnosis not present

## 2018-08-24 DIAGNOSIS — I10 Essential (primary) hypertension: Secondary | ICD-10-CM

## 2018-08-24 DIAGNOSIS — H35033 Hypertensive retinopathy, bilateral: Secondary | ICD-10-CM

## 2018-08-24 DIAGNOSIS — M6281 Muscle weakness (generalized): Secondary | ICD-10-CM | POA: Diagnosis not present

## 2018-09-18 ENCOUNTER — Other Ambulatory Visit: Payer: Self-pay

## 2018-09-18 ENCOUNTER — Encounter: Payer: Self-pay | Admitting: Family Medicine

## 2018-09-18 ENCOUNTER — Ambulatory Visit (INDEPENDENT_AMBULATORY_CARE_PROVIDER_SITE_OTHER): Payer: PPO | Admitting: Family Medicine

## 2018-09-18 DIAGNOSIS — K219 Gastro-esophageal reflux disease without esophagitis: Secondary | ICD-10-CM

## 2018-09-18 DIAGNOSIS — E119 Type 2 diabetes mellitus without complications: Secondary | ICD-10-CM

## 2018-09-18 DIAGNOSIS — I10 Essential (primary) hypertension: Secondary | ICD-10-CM | POA: Diagnosis not present

## 2018-09-18 MED ORDER — SIMVASTATIN 40 MG PO TABS: 20.0000 mg | ORAL_TABLET | Freq: Every day | ORAL | 1 refills | Status: DC

## 2018-09-18 MED ORDER — METFORMIN HCL 1000 MG PO TABS: ORAL_TABLET | ORAL | 1 refills | Status: DC

## 2018-09-18 MED ORDER — AMLODIPINE BESYLATE 5 MG PO TABS: 5.0000 mg | ORAL_TABLET | Freq: Every day | ORAL | 1 refills | Status: DC

## 2018-09-18 MED ORDER — CHLORTHALIDONE 25 MG PO TABS: 25.0000 mg | ORAL_TABLET | ORAL | 1 refills | Status: DC

## 2018-09-18 MED ORDER — LOSARTAN POTASSIUM 100 MG PO TABS: 100.0000 mg | ORAL_TABLET | Freq: Every day | ORAL | 1 refills | Status: DC

## 2018-09-18 NOTE — Progress Notes (Signed)
Subjective:    Patient ID: Jared Norman, male    DOB: Apr 16, 1941, 78 y.o.   MRN: [id number]   HPI: Jared Norman is a 78 y.o. male presenting for diabetes follow up. . Denies hypoglycemic spells.  Follow-up of diabetes. Patient does check blood sugar at home. Glucose 120-130 fasting Patient denies symptoms such as polyuria, polydipsia, excessive hunger, nausea No significant hypoglycemic spells noted. Medications as noted below. Taking them regularly without complication/adverse reaction being reported today.  Acid reflux occasional only.Not taking PPI regularly. There is no chest pain or heartburn. No hematemesis and no melena. No dysphagia or choking. Onset is remote. Progression is stable. Complicating factors, none.   Follow-up of hypertension. Patient has no history of headache chest pain or shortness of breath or recent cough. Patient also denies symptoms of TIA such as numbness weakness lateralizing. Patient checks  blood pressure at home and has not had any elevated readings recently. Patient denies side effects from his medication. States taking it regularly.   Depression screen [facility] 2/9 05/26/2018 03/02/2018 01/21/2018 11/26/2017 08/15/2017  Decreased Interest 0 0 0 0 0  Down, Depressed, Hopeless 0 0 0 0 0  PHQ - 2 Score 0 0 0 0 0     Relevant past medical, surgical, family and social history reviewed and updated as indicated.  Interim medical history since our last visit reviewed. Allergies and medications reviewed and updated.  ROS:  Review of Systems  Constitutional: Negative.   HENT: Negative.   Eyes: Negative for visual disturbance.  Respiratory: Negative for cough and shortness of breath.   Cardiovascular: Negative for chest pain and leg swelling.  Gastrointestinal: Negative for abdominal pain, diarrhea, nausea and vomiting.  Genitourinary: Negative for difficulty urinating.  Musculoskeletal: Negative for arthralgias and myalgias.  Skin: Negative for rash.   Neurological: Negative for headaches.  Psychiatric/Behavioral: Negative for sleep disturbance.     Social History   Tobacco Use  Smoking Status Never Smoker  Smokeless Tobacco Never Used       Objective:     Wt Readings from Last 3 Encounters:  06/24/18 177 lb (80.3 kg)  06/16/18 177 lb 4 oz (80.4 kg)  06/04/18 174 lb (78.9 kg)     Exam deferred. Pt. Harboring due to COVID 19. Phone visit performed.   Assessment & Plan:  No diagnosis found.  Meds ordered this encounter  Medications  . amLODipine (NORVASC) 5 MG tablet    Sig: Take 1 tablet (5 mg total) by mouth daily.    Dispense:  90 tablet    Refill:  1  . chlorthalidone (HYGROTON) 25 MG tablet    Sig: Take 1 tablet (25 mg total) by mouth every morning.    Dispense:  90 tablet    Refill:  1  . losartan (COZAAR) 100 MG tablet    Sig: Take 1 tablet (100 mg total) by mouth daily.    Dispense:  90 tablet    Refill:  1  . metFORMIN (GLUCOPHAGE) 1000 MG tablet    Sig: TAKE ONE TABLET BY MOUTH ONCE DAILY WITH BREAKFAST    Dispense:  90 tablet    Refill:  1  . simvastatin (ZOCOR) 40 MG tablet    Sig: Take 0.5 tablets (20 mg total) by mouth at bedtime.    Dispense:  90 tablet    Refill:  1    No orders of the defined types were placed in this encounter.     There are no  diagnoses linked to this encounter.  Virtual Visit via telephone Note  I discussed the limitations, risks, security and privacy concerns of performing an evaluation and management service by telephone and the availability of in person appointments. The patient was identified with two identifiers. Pt.expressed understanding and agreed to proceed. Pt. Is at home. Dr. Livia Snellen is in his office.  Follow Up Instructions:   I discussed the assessment and treatment plan with the patient. The patient was provided an opportunity to ask questions and all were answered. The patient agreed with the plan and demonstrated an understanding of the instructions.    The patient was advised to call back or seek an in-person evaluation if the symptoms worsen or if the condition fails to improve as anticipated.   Total minutes including chart review and phone contact time: 25  Pantoprazole is an all or none med. Take for a minimum of six weeks to avoid an ulcer. Resume if symptoms recur. Always for at least 6 weeks.  Follow up plan: Return in about 3 months (around 12/19/2018).  Claretta Fraise, MD [place]

## 2018-09-21 ENCOUNTER — Encounter (INDEPENDENT_AMBULATORY_CARE_PROVIDER_SITE_OTHER): Payer: PPO | Admitting: Ophthalmology

## 2018-09-21 ENCOUNTER — Other Ambulatory Visit: Payer: Self-pay

## 2018-09-21 DIAGNOSIS — H43813 Vitreous degeneration, bilateral: Secondary | ICD-10-CM

## 2018-09-21 DIAGNOSIS — E113513 Type 2 diabetes mellitus with proliferative diabetic retinopathy with macular edema, bilateral: Secondary | ICD-10-CM

## 2018-09-21 DIAGNOSIS — H35033 Hypertensive retinopathy, bilateral: Secondary | ICD-10-CM

## 2018-09-21 DIAGNOSIS — E11311 Type 2 diabetes mellitus with unspecified diabetic retinopathy with macular edema: Secondary | ICD-10-CM

## 2018-09-21 DIAGNOSIS — I10 Essential (primary) hypertension: Secondary | ICD-10-CM

## 2018-10-18 ENCOUNTER — Other Ambulatory Visit: Payer: Self-pay | Admitting: Family Medicine

## 2018-10-18 DIAGNOSIS — M7731 Calcaneal spur, right foot: Secondary | ICD-10-CM

## 2018-10-19 ENCOUNTER — Other Ambulatory Visit: Payer: Self-pay

## 2018-10-19 ENCOUNTER — Encounter (INDEPENDENT_AMBULATORY_CARE_PROVIDER_SITE_OTHER): Payer: PPO | Admitting: Ophthalmology

## 2018-10-19 DIAGNOSIS — I10 Essential (primary) hypertension: Secondary | ICD-10-CM

## 2018-10-19 DIAGNOSIS — H35033 Hypertensive retinopathy, bilateral: Secondary | ICD-10-CM | POA: Diagnosis not present

## 2018-10-19 DIAGNOSIS — H43813 Vitreous degeneration, bilateral: Secondary | ICD-10-CM

## 2018-10-19 DIAGNOSIS — E113513 Type 2 diabetes mellitus with proliferative diabetic retinopathy with macular edema, bilateral: Secondary | ICD-10-CM

## 2018-10-19 DIAGNOSIS — E11311 Type 2 diabetes mellitus with unspecified diabetic retinopathy with macular edema: Secondary | ICD-10-CM | POA: Diagnosis not present

## 2018-11-18 ENCOUNTER — Encounter (INDEPENDENT_AMBULATORY_CARE_PROVIDER_SITE_OTHER): Payer: PPO | Admitting: Ophthalmology

## 2018-11-18 ENCOUNTER — Other Ambulatory Visit: Payer: Self-pay

## 2018-11-18 DIAGNOSIS — E11311 Type 2 diabetes mellitus with unspecified diabetic retinopathy with macular edema: Secondary | ICD-10-CM

## 2018-11-18 DIAGNOSIS — E113513 Type 2 diabetes mellitus with proliferative diabetic retinopathy with macular edema, bilateral: Secondary | ICD-10-CM

## 2018-11-18 DIAGNOSIS — H43813 Vitreous degeneration, bilateral: Secondary | ICD-10-CM | POA: Diagnosis not present

## 2018-11-18 DIAGNOSIS — I10 Essential (primary) hypertension: Secondary | ICD-10-CM

## 2018-11-18 DIAGNOSIS — H35033 Hypertensive retinopathy, bilateral: Secondary | ICD-10-CM

## 2018-12-08 ENCOUNTER — Other Ambulatory Visit: Payer: Self-pay | Admitting: Family Medicine

## 2018-12-16 ENCOUNTER — Encounter (INDEPENDENT_AMBULATORY_CARE_PROVIDER_SITE_OTHER): Payer: PPO | Admitting: Ophthalmology

## 2018-12-16 ENCOUNTER — Other Ambulatory Visit: Payer: Self-pay

## 2018-12-16 DIAGNOSIS — H43813 Vitreous degeneration, bilateral: Secondary | ICD-10-CM | POA: Diagnosis not present

## 2018-12-16 DIAGNOSIS — I10 Essential (primary) hypertension: Secondary | ICD-10-CM | POA: Diagnosis not present

## 2018-12-16 DIAGNOSIS — H35033 Hypertensive retinopathy, bilateral: Secondary | ICD-10-CM | POA: Diagnosis not present

## 2018-12-16 DIAGNOSIS — E11311 Type 2 diabetes mellitus with unspecified diabetic retinopathy with macular edema: Secondary | ICD-10-CM

## 2018-12-16 DIAGNOSIS — E113513 Type 2 diabetes mellitus with proliferative diabetic retinopathy with macular edema, bilateral: Secondary | ICD-10-CM | POA: Diagnosis not present

## 2018-12-21 ENCOUNTER — Other Ambulatory Visit: Payer: Self-pay

## 2018-12-21 ENCOUNTER — Ambulatory Visit (INDEPENDENT_AMBULATORY_CARE_PROVIDER_SITE_OTHER): Payer: PPO | Admitting: Otolaryngology

## 2018-12-21 ENCOUNTER — Ambulatory Visit: Payer: PPO | Admitting: Family Medicine

## 2018-12-21 DIAGNOSIS — H6123 Impacted cerumen, bilateral: Secondary | ICD-10-CM

## 2019-01-07 ENCOUNTER — Other Ambulatory Visit: Payer: Self-pay | Admitting: Family Medicine

## 2019-01-07 DIAGNOSIS — M7731 Calcaneal spur, right foot: Secondary | ICD-10-CM

## 2019-01-11 ENCOUNTER — Other Ambulatory Visit: Payer: Self-pay

## 2019-01-12 ENCOUNTER — Ambulatory Visit (INDEPENDENT_AMBULATORY_CARE_PROVIDER_SITE_OTHER): Payer: PPO | Admitting: Family Medicine

## 2019-01-12 ENCOUNTER — Ambulatory Visit: Payer: PPO | Admitting: Family Medicine

## 2019-01-12 ENCOUNTER — Encounter: Payer: Self-pay | Admitting: Family Medicine

## 2019-01-12 VITALS — BP 140/88 | HR 99 | Temp 97.7°F | Ht 63.0 in | Wt 180.0 lb

## 2019-01-12 DIAGNOSIS — R1013 Epigastric pain: Secondary | ICD-10-CM | POA: Diagnosis not present

## 2019-01-12 DIAGNOSIS — I10 Essential (primary) hypertension: Secondary | ICD-10-CM

## 2019-01-12 DIAGNOSIS — E119 Type 2 diabetes mellitus without complications: Secondary | ICD-10-CM

## 2019-01-12 LAB — BAYER DCA HB A1C WAIVED: HB A1C (BAYER DCA - WAIVED): 8.3 % — ABNORMAL HIGH (ref <7.0)

## 2019-01-12 MED ORDER — PANTOPRAZOLE SODIUM 40 MG PO TBEC: 40.0000 mg | DELAYED_RELEASE_TABLET | Freq: Two times a day (BID) | ORAL | 1 refills | Status: DC

## 2019-01-12 MED ORDER — FINASTERIDE 5 MG PO TABS: 5.0000 mg | ORAL_TABLET | Freq: Every day | ORAL | 1 refills | Status: DC

## 2019-01-12 MED ORDER — AMLODIPINE BESYLATE 5 MG PO TABS: 5.0000 mg | ORAL_TABLET | Freq: Every day | ORAL | 1 refills | Status: DC

## 2019-01-12 MED ORDER — METFORMIN HCL 1000 MG PO TABS: ORAL_TABLET | ORAL | 1 refills | Status: DC

## 2019-01-12 MED ORDER — LOSARTAN POTASSIUM 100 MG PO TABS: 100.0000 mg | ORAL_TABLET | Freq: Every day | ORAL | 1 refills | Status: DC

## 2019-01-12 MED ORDER — SIMVASTATIN 40 MG PO TABS: 20.0000 mg | ORAL_TABLET | Freq: Every day | ORAL | 1 refills | Status: DC

## 2019-01-12 NOTE — Progress Notes (Signed)
Subjective:  Patient ID: Jared Norman, male    DOB: Jan 11, 1941  Age: 78 y.o. MRN: [id number]  CC: Medical Management of Chronic Issues   HPI Jared Norman presents forFollow-up of diabetes. Patient checks blood sugar at home.   144 fasting. Patient denies symptoms such as polyuria, polydipsia, excessive hunger, nausea No significant hypoglycemic spells noted. Medications reviewed. Pt reports taking them regularly without complication/adverse reaction being reported today.  Checking feet daily.   History Jared Norman has a past medical history of Arthritis, BPH (benign prostatic hyperplasia), Cataract, Gallstones, Hyperlipidemia, Hypertension, Kidney stones, Type 2 diabetes mellitus ([place]), and Vitamin D deficiency.   He has a past surgical history that includes Cystoscopy with retrograde pyelogram, ureteroscopy and stent placement (Left, 12/20/2013); Foot surgery (Right, 2008); Laparoscopic cholecystectomy (05/2018); and Cataract extraction, bilateral.   His family history includes ALS in his brother; Bladder Cancer in his father; Blindness in his father and paternal grandfather; Diabetes in his brother; Hypertension in his brother.He reports that he has never smoked. He has never used smokeless tobacco. He reports that he does not drink alcohol or use drugs.  Current Outpatient Medications on File Prior to Visit  Medication Sig Dispense Refill  . aspirin EC 81 MG tablet Take 81 mg by mouth every Monday, Wednesday, and Friday.    . cholecalciferol (VITAMIN D) 1000 UNITS tablet Take 2,000 Units by mouth daily.    Marland Kitchen EASY TOUCH LANCETS 26G MISC Use to check BS daily DX E11.9 100 each 3  . glucose blood test strip Test glucose up to 4 times daily 100 each 12  . magnesium gluconate (MAGONATE) 500 MG tablet Take 1,000 mg by mouth daily.     . meloxicam (MOBIC) 15 MG tablet Take 1 tablet by mouth once daily 90 tablet 0   No current facility-administered medications on file prior to visit.      ROS Review of Systems  Constitutional: Negative.   HENT: Negative.   Eyes: Negative for visual disturbance.  Respiratory: Negative for cough and shortness of breath.   Cardiovascular: Negative for chest pain and leg swelling.  Gastrointestinal: Negative for abdominal pain, diarrhea, nausea and vomiting.  Genitourinary: Negative for difficulty urinating.  Musculoskeletal: Negative for arthralgias and myalgias.  Skin: Negative for rash.  Neurological: Negative for headaches.  Psychiatric/Behavioral: Negative for sleep disturbance.    Objective:  BP 140/88   Pulse 99   Temp 97.7 F (36.5 C) (Oral)   Ht '5\' 3"'$  (1.6 m)   Wt 180 lb (81.6 kg)   BMI 31.89 kg/m   BP Readings from Last 3 Encounters:  01/12/19 140/88  06/24/18 (!) 153/88  06/16/18 110/70    Wt Readings from Last 3 Encounters:  01/12/19 180 lb (81.6 kg)  06/24/18 177 lb (80.3 kg)  06/16/18 177 lb 4 oz (80.4 kg)     Physical Exam Constitutional:      General: He is not in acute distress.    Appearance: He is well-developed.  HENT:     Head: Normocephalic and atraumatic.     Right Ear: External ear normal.     Left Ear: External ear normal.     Nose: Nose normal.  Eyes:     Conjunctiva/sclera: Conjunctivae normal.     Pupils: Pupils are equal, round, and reactive to light.  Neck:     Musculoskeletal: Normal range of motion and neck supple.  Cardiovascular:     Rate and Rhythm: Normal rate and regular rhythm.  Heart sounds: Normal heart sounds. No murmur.  Pulmonary:     Effort: Pulmonary effort is normal. No respiratory distress.     Breath sounds: Normal breath sounds. No wheezing or rales.  Abdominal:     Palpations: Abdomen is soft.     Tenderness: There is no abdominal tenderness.  Musculoskeletal: Normal range of motion.  Skin:    General: Skin is warm and dry.  Neurological:     Mental Status: He is alert and oriented to person, place, and time.     Deep Tendon Reflexes: Reflexes are  normal and symmetric.  Psychiatric:        Behavior: Behavior normal.        Thought Content: Thought content normal.        Judgment: Judgment normal.       Assessment & Plan:   Jared Norman was seen today for medical management of chronic issues.  Diagnoses and all orders for this visit:  Diabetes mellitus without complication ([place]) -     Bayer DCA Hb A1c Waived  Essential hypertension -     CBC with Differential/Platelet -     CMP14+EGFR  Epigastric pain -     pantoprazole (PROTONIX) 40 MG tablet; Take 1 tablet (40 mg total) by mouth 2 (two) times daily.  Other orders -     metFORMIN (GLUCOPHAGE) 1000 MG tablet; TAKE ONE TABLET BY MOUTH ONCE DAILY WITH BREAKFAST -     finasteride (PROSCAR) 5 MG tablet; Take 1 tablet (5 mg total) by mouth daily. -     Discontinue: losartan (COZAAR) 100 MG tablet; Take 1 tablet (100 mg total) by mouth daily. -     simvastatin (ZOCOR) 40 MG tablet; Take 0.5 tablets (20 mg total) by mouth at bedtime. -     amLODipine (NORVASC) 5 MG tablet; Take 1 tablet (5 mg total) by mouth daily.      I have discontinued Jared Norman's blood glucose meter kit and supplies, Blood Glucose Monitor System, chlorthalidone, and losartan. I have also changed his finasteride. Additionally, I am having him maintain his magnesium gluconate, cholecalciferol, aspirin EC, Easy Touch Lancets 26G, glucose blood, meloxicam, metFORMIN, simvastatin, pantoprazole, and amLODipine.  Meds ordered this encounter  Medications  . metFORMIN (GLUCOPHAGE) 1000 MG tablet    Sig: TAKE ONE TABLET BY MOUTH ONCE DAILY WITH BREAKFAST    Dispense:  90 tablet    Refill:  1  . finasteride (PROSCAR) 5 MG tablet    Sig: Take 1 tablet (5 mg total) by mouth daily.    Dispense:  90 tablet    Refill:  1  . DISCONTD: losartan (COZAAR) 100 MG tablet    Sig: Take 1 tablet (100 mg total) by mouth daily.    Dispense:  90 tablet    Refill:  1  . simvastatin (ZOCOR) 40 MG tablet    Sig: Take 0.5  tablets (20 mg total) by mouth at bedtime.    Dispense:  90 tablet    Refill:  1  . pantoprazole (PROTONIX) 40 MG tablet    Sig: Take 1 tablet (40 mg total) by mouth 2 (two) times daily.    Dispense:  180 tablet    Refill:  1  . amLODipine (NORVASC) 5 MG tablet    Sig: Take 1 tablet (5 mg total) by mouth daily.    Dispense:  90 tablet    Refill:  1     Follow-up: No follow-ups on file.  Claretta Fraise,  M.D. 

## 2019-01-13 ENCOUNTER — Other Ambulatory Visit: Payer: Self-pay | Admitting: Family Medicine

## 2019-01-13 LAB — CMP14+EGFR
ALT: 26 IU/L (ref 0–44)
AST: 20 IU/L (ref 0–40)
Albumin/Globulin Ratio: 2.3 — ABNORMAL HIGH (ref 1.2–2.2)
Albumin: 4.3 g/dL (ref 3.7–4.7)
Alkaline Phosphatase: 66 IU/L (ref 39–117)
BUN/Creatinine Ratio: 18 (ref 10–24)
BUN: 23 mg/dL (ref 8–27)
Bilirubin Total: 0.7 mg/dL (ref 0.0–1.2)
CO2: 27 mmol/L (ref 20–29)
Calcium: 9.8 mg/dL (ref 8.6–10.2)
Chloride: 99 mmol/L (ref 96–106)
Creatinine, Ser: 1.26 mg/dL (ref 0.76–1.27)
GFR calc Af Amer: 62 mL/min/{1.73_m2} (ref >59)
GFR calc non Af Amer: 54 mL/min/{1.73_m2} — ABNORMAL LOW (ref >59)
Globulin, Total: 1.9 g/dL (ref 1.5–4.5)
Glucose: 140 mg/dL — ABNORMAL HIGH (ref 65–99)
Potassium: 5.5 mmol/L — ABNORMAL HIGH (ref 3.5–5.2)
Sodium: 139 mmol/L (ref 134–144)
Total Protein: 6.2 g/dL (ref 6.0–8.5)

## 2019-01-13 LAB — CBC WITH DIFFERENTIAL/PLATELET
Basophils Absolute: 0.1 10*3/uL (ref 0.0–0.2)
Basos: 1 %
EOS (ABSOLUTE): 0.3 10*3/uL (ref 0.0–0.4)
Eos: 4 %
Hematocrit: 40.9 % (ref 37.5–51.0)
Hemoglobin: 13.4 g/dL (ref 13.0–17.7)
Immature Grans (Abs): 0 10*3/uL (ref 0.0–0.1)
Immature Granulocytes: 0 %
Lymphocytes Absolute: 2.8 10*3/uL (ref 0.7–3.1)
Lymphs: 38 %
MCH: 28.9 pg (ref 26.6–33.0)
MCHC: 32.8 g/dL (ref 31.5–35.7)
MCV: 88 fL (ref 79–97)
Monocytes Absolute: 0.8 10*3/uL (ref 0.1–0.9)
Monocytes: 11 %
Neutrophils Absolute: 3.4 10*3/uL (ref 1.4–7.0)
Neutrophils: 46 %
Platelets: 216 10*3/uL (ref 150–450)
RBC: 4.64 x10E6/uL (ref 4.14–5.80)
RDW: 13.5 % (ref 11.6–15.4)
WBC: 7.2 10*3/uL (ref 3.4–10.8)

## 2019-01-13 MED ORDER — LOSARTAN POTASSIUM 100 MG PO TABS: 50.0000 mg | ORAL_TABLET | Freq: Every day | ORAL | 1 refills | Status: DC

## 2019-01-15 ENCOUNTER — Encounter: Payer: Self-pay | Admitting: Family Medicine

## 2019-01-20 ENCOUNTER — Other Ambulatory Visit: Payer: Self-pay

## 2019-01-20 ENCOUNTER — Encounter (INDEPENDENT_AMBULATORY_CARE_PROVIDER_SITE_OTHER): Payer: PPO | Admitting: Ophthalmology

## 2019-01-20 DIAGNOSIS — H35033 Hypertensive retinopathy, bilateral: Secondary | ICD-10-CM

## 2019-01-20 DIAGNOSIS — E11311 Type 2 diabetes mellitus with unspecified diabetic retinopathy with macular edema: Secondary | ICD-10-CM

## 2019-01-20 DIAGNOSIS — E113513 Type 2 diabetes mellitus with proliferative diabetic retinopathy with macular edema, bilateral: Secondary | ICD-10-CM

## 2019-01-20 DIAGNOSIS — H43813 Vitreous degeneration, bilateral: Secondary | ICD-10-CM

## 2019-01-20 DIAGNOSIS — I10 Essential (primary) hypertension: Secondary | ICD-10-CM | POA: Diagnosis not present

## 2019-01-22 ENCOUNTER — Emergency Department (HOSPITAL_COMMUNITY)
Admission: EM | Admit: 2019-01-22 | Discharge: 2019-01-23 | Disposition: A | Discharge status: Home / Self Care | Payer: PPO | Attending: Emergency Medicine | Admitting: Emergency Medicine

## 2019-01-22 ENCOUNTER — Encounter (HOSPITAL_COMMUNITY): Payer: Self-pay

## 2019-01-22 ENCOUNTER — Other Ambulatory Visit: Payer: Self-pay

## 2019-01-22 DIAGNOSIS — I959 Hypotension, unspecified: Secondary | ICD-10-CM | POA: Diagnosis not present

## 2019-01-22 DIAGNOSIS — N179 Acute kidney failure, unspecified: Secondary | ICD-10-CM | POA: Diagnosis not present

## 2019-01-22 DIAGNOSIS — Z7982 Long term (current) use of aspirin: Secondary | ICD-10-CM | POA: Diagnosis not present

## 2019-01-22 DIAGNOSIS — E860 Dehydration: Secondary | ICD-10-CM | POA: Diagnosis not present

## 2019-01-22 DIAGNOSIS — Z7984 Long term (current) use of oral hypoglycemic drugs: Secondary | ICD-10-CM | POA: Diagnosis not present

## 2019-01-22 DIAGNOSIS — E119 Type 2 diabetes mellitus without complications: Secondary | ICD-10-CM | POA: Insufficient documentation

## 2019-01-22 DIAGNOSIS — Z79899 Other long term (current) drug therapy: Secondary | ICD-10-CM | POA: Insufficient documentation

## 2019-01-22 DIAGNOSIS — I10 Essential (primary) hypertension: Secondary | ICD-10-CM | POA: Diagnosis not present

## 2019-01-22 DIAGNOSIS — R42 Dizziness and giddiness: Secondary | ICD-10-CM | POA: Diagnosis present

## 2019-01-22 LAB — URINALYSIS, ROUTINE W REFLEX MICROSCOPIC
Bilirubin Urine: NEGATIVE
Glucose, UA: 50 mg/dL — AB
Hgb urine dipstick: NEGATIVE
Ketones, ur: 5 mg/dL — AB
Nitrite: NEGATIVE
Protein, ur: NEGATIVE mg/dL
Specific Gravity, Urine: 1.017 (ref 1.005–1.030)
pH: 5 (ref 5.0–8.0)

## 2019-01-22 LAB — BASIC METABOLIC PANEL
Anion gap: 16 — ABNORMAL HIGH (ref 5–15)
BUN: 50 mg/dL — ABNORMAL HIGH (ref 8–23)
CO2: 19 mmol/L — ABNORMAL LOW (ref 22–32)
Calcium: 9.6 mg/dL (ref 8.9–10.3)
Chloride: 100 mmol/L (ref 98–111)
Creatinine, Ser: 2.31 mg/dL — ABNORMAL HIGH (ref 0.61–1.24)
GFR calc Af Amer: 30 mL/min — ABNORMAL LOW (ref >60)
GFR calc non Af Amer: 26 mL/min — ABNORMAL LOW (ref >60)
Glucose, Bld: 161 mg/dL — ABNORMAL HIGH (ref 70–99)
Potassium: 3.9 mmol/L (ref 3.5–5.1)
Sodium: 135 mmol/L (ref 135–145)

## 2019-01-22 LAB — CBC
HCT: 43.6 % (ref 39.0–52.0)
Hemoglobin: 14 g/dL (ref 13.0–17.0)
MCH: 29.5 pg (ref 26.0–34.0)
MCHC: 32.1 g/dL (ref 30.0–36.0)
MCV: 92 fL (ref 80.0–100.0)
Platelets: 277 10*3/uL (ref 150–400)
RBC: 4.74 MIL/uL (ref 4.22–5.81)
RDW: 14.1 % (ref 11.5–15.5)
WBC: 10.3 10*3/uL (ref 4.0–10.5)
nRBC: 0 % (ref 0.0–0.2)

## 2019-01-22 LAB — CBG MONITORING, ED: Glucose-Capillary: 155 mg/dL — ABNORMAL HIGH (ref 70–99)

## 2019-01-22 MED ORDER — SODIUM CHLORIDE 0.9 % IV BOLUS: 1000.0000 mL | Freq: Once | INTRAVENOUS | Status: DC

## 2019-01-22 MED ORDER — SODIUM CHLORIDE 0.9 % IV BOLUS
1500.0000 mL | Freq: Once | INTRAVENOUS | Status: AC
  Administered 2019-01-22: 1500 mL via INTRAVENOUS

## 2019-01-22 MED ORDER — SODIUM CHLORIDE 0.9% FLUSH: 3.0000 mL | Freq: Once | INTRAVENOUS | Status: DC

## 2019-01-22 NOTE — ED Notes (Signed)
During orthostatic vital signs, pt denied dizziness or lightheadedness. Pt stated normally he feels dizzy from lying to standing in the past several days.

## 2019-01-22 NOTE — ED Notes (Signed)
Pt requested crackers with peanut butter d/t not eating since lunch.

## 2019-01-22 NOTE — ED Provider Notes (Signed)
[facility] DEPT Provider Note   CSN: [id number] Arrival date & time: 01/22/19  1552     History   Chief Complaint Chief Complaint  Patient presents with  . Loss of Consciousness  . Hypotension    HPI Jared Norman is a 78 y.o. male.    78 year old male with a history of hypertension, dyslipidemia, diabetes, BPH presents to the emergency department for complaints of lightheadedness.  He has been feeling increasingly lightheaded over the past few days which is aggravated by position change.  He did have a syncopal event 2 days ago, but was caught by his son and lowered to the ground.  Did not fall or strike his head.  Took his blood pressure and noted it to be in the [id number] systolic.  As a result, he has not taken his blood pressure medication x2 days.  Has been feeling slightly less lightheaded.  Believes that his symptom onset is due to a rapid change in his diet.  He has been eating and drinking very little after an appointment with his primary care doctor where his hemoglobin A1c and fasting sugar were elevated.  He has lost approximately 12 pounds in the past 1.5 weeks.  No fevers, chest pain, SOB, V/D.  The history is provided by the patient. No language interpreter was used.  Loss of Consciousness   Past Medical History:  Diagnosis Date  . Arthritis   . BPH (benign prostatic hyperplasia)   . Cataract   . Gallstones   . Hyperlipidemia   . Hypertension   . Kidney stones   . Type 2 diabetes mellitus ([place])   . Vitamin D deficiency     Patient Active Problem List   Diagnosis Date Noted  . GERD (gastroesophageal reflux disease) 05/26/2018  . Bilateral sensorineural hearing loss 01/21/2018  . Diabetic neuropathy ([place]) 05/26/2015  . Arthritis of knee 05/26/2015  . BPH (benign prostatic hyperplasia)   . Vitamin D deficiency   . Diabetes mellitus without complication ([place])   . Hyperlipidemia   . Hypertension     Past Surgical History:   Procedure Laterality Date  . CATARACT EXTRACTION, BILATERAL    . CYSTOSCOPY WITH RETROGRADE PYELOGRAM, URETEROSCOPY AND STENT PLACEMENT Left 12/20/2013   Procedure: CYSTOSCOPY WITHLEFT RETROGRADE PYELOGRAM,  AND LEFT STENT PLACEMENT;  Surgeon: Malka So, MD;  Location: [facility] ORS;  Service: Urology;  Laterality: Left;  . FOOT SURGERY Right 2008   Tendon rupture  . LAPAROSCOPIC CHOLECYSTECTOMY  05/2018   Dr. Ladona Horns        Home Medications    Prior to Admission medications   Medication Sig Start Date End Date Taking? Authorizing Provider  aspirin EC 81 MG tablet Take 81 mg by mouth every Monday, Wednesday, and Friday.   Yes [provider]  cholecalciferol (VITAMIN D) 1000 UNITS tablet Take 2,000 Units by mouth daily.   Yes [provider]  finasteride (PROSCAR) 5 MG tablet Take 1 tablet (5 mg total) by mouth daily. 01/12/19  Yes Claretta Fraise, MD  losartan (COZAAR) 100 MG tablet Take 0.5 tablets (50 mg total) by mouth daily. 01/13/19  Yes Stacks, Cletus Gash, MD  magnesium gluconate (MAGONATE) 500 MG tablet Take 1,000 mg by mouth daily.    Yes [provider]  meloxicam (MOBIC) 15 MG tablet Take 1 tablet by mouth once daily 01/07/19  Yes Stacks, Cletus Gash, MD  metFORMIN (GLUCOPHAGE) 1000 MG tablet TAKE ONE TABLET BY MOUTH ONCE DAILY WITH BREAKFAST 01/12/19  Yes  Claretta Fraise, MD  pantoprazole (PROTONIX) 40 MG tablet Take 1 tablet (40 mg total) by mouth 2 (two) times daily. 01/12/19  Yes Claretta Fraise, MD  simvastatin (ZOCOR) 40 MG tablet Take 0.5 tablets (20 mg total) by mouth at bedtime. Patient taking differently: Take 20 mg by mouth daily.  01/12/19  Yes Stacks, Cletus Gash, MD  amLODipine (NORVASC) 5 MG tablet Take 1 tablet (5 mg total) by mouth daily. Patient not taking: Reported on 01/22/2019 01/12/19   Claretta Fraise, MD  EASY TOUCH LANCETS 26G MISC Use to check BS daily DX E11.9 06/01/18   Claretta Fraise, MD  glucose blood test strip Test glucose up to 4 times daily 06/01/18    Claretta Fraise, MD    Family History Family History  Problem Relation Age of Onset  . ALS Brother        Agent Orange  . Hypertension Brother   . Diabetes Brother   . Bladder Cancer Father   . Blindness Father   . Blindness Paternal Grandfather     Social History Social History   Tobacco Use  . Smoking status: Never Smoker  . Smokeless tobacco: Never Used  Substance Use Topics  . Alcohol use: No  . Drug use: No     Allergies   Patient has no known allergies.   Review of Systems Review of Systems  Cardiovascular: Positive for syncope.  Ten systems reviewed and are negative for acute change, except as noted in the HPI.     Physical Exam Updated Vital Signs BP 110/74   Pulse 65   Temp 97.9 F (36.6 C) (Oral)   Resp 13   SpO2 93%   Physical Exam Vitals signs and nursing note reviewed.  Constitutional:      General: He is not in acute distress.    Appearance: He is well-developed. He is not diaphoretic.     Comments: Nontoxic appearing and in NAD  HENT:     Head: Normocephalic and atraumatic.  Eyes:     General: No scleral icterus.    Conjunctiva/sclera: Conjunctivae normal.  Neck:     Musculoskeletal: Normal range of motion.  Cardiovascular:     Rate and Rhythm: Normal rate and regular rhythm.     Pulses: Normal pulses.  Pulmonary:     Effort: Pulmonary effort is normal. No respiratory distress.     Breath sounds: No stridor. No wheezing or rales.     Comments: Respirations even and unlabored Musculoskeletal: Normal range of motion.  Skin:    General: Skin is warm and dry.     Coloration: Skin is not pale.     Findings: No erythema or rash.  Neurological:     Mental Status: He is alert and oriented to person, place, and time.  Psychiatric:        Behavior: Behavior normal.      ED Treatments / Results  Labs (all labs ordered are listed, but only abnormal results are displayed) Labs Reviewed  BASIC METABOLIC PANEL - Abnormal; Notable for  the following components:      Result Value   CO2 19 (*)    Glucose, Bld 161 (*)    BUN 50 (*)    Creatinine, Ser 2.31 (*)    GFR calc non Af Amer 26 (*)    GFR calc Af Amer 30 (*)    Anion gap 16 (*)    All other components within normal limits  URINALYSIS, ROUTINE W REFLEX MICROSCOPIC - Abnormal; Notable  for the following components:   APPearance HAZY (*)    Glucose, UA 50 (*)    Ketones, ur 5 (*)    Leukocytes,Ua SMALL (*)    Bacteria, UA RARE (*)    All other components within normal limits  BASIC METABOLIC PANEL - Abnormal; Notable for the following components:   Potassium 3.4 (*)    Glucose, Bld 166 (*)    BUN 47 (*)    Creatinine, Ser 1.78 (*)    Calcium 8.5 (*)    GFR calc non Af Amer 35 (*)    GFR calc Af Amer 41 (*)    All other components within normal limits  CBG MONITORING, ED - Abnormal; Notable for the following components:   Glucose-Capillary 155 (*)    All other components within normal limits  CBC    EKG None  Radiology No results found.  Procedures Procedures (including critical care time)  Medications Ordered in ED Medications  sodium chloride flush (NS) 0.9 % injection 3 mL (0 mLs Intravenous Hold 01/22/19 2302)  sodium chloride 0.9 % bolus 1,500 mL (0 mLs Intravenous Stopped 01/23/19 0045)     Initial Impression / Assessment and Plan / ED Course  I have reviewed the triage vital signs and the nursing notes.  Pertinent labs & imaging results that were available during my care of the patient were reviewed by me and considered in my medical decision making (see chart for details).        45:25 PM 78 year old male with rapid change in diet over the past 1.5 weeks after a visit to his primary care doctor where his hemoglobin A1c and fasting glucose were elevated.  He has been eating and drinking very little since this appointment with syncopal episode 2 days ago and persistent orthostasis.  His orthostatic vital signs in the emergency  department are reassuring, but labs suggestive of acute ketosis with acute kidney injury.  The patient is not intent on admission to the hospital.  Plan for hydration with 1.5 L IV fluids and recheck of creatinine.  1:45 AM Patient has remained hemodynamically stable while in the emergency department.  Creatinine is improved following IV fluid hydration.  Patient feels comfortable with outpatient follow-up.  He is to have his kidney function rechecked within 1 week by his primary care doctor.  Advised that he remain off of his blood pressure medications until he sees his PCP.  Return precautions discussed and provided. Patient discharged in stable condition with no unaddressed concerns.   Final Clinical Impressions(s) / ED Diagnoses   Final diagnoses:  Acute kidney injury [facility])  Dehydration    ED Discharge Orders    None       Antonietta Breach, PA-C 01/23/19 0214    Fatima Blank, MD 01/24/19 [phone]

## 2019-01-22 NOTE — ED Triage Notes (Addendum)
Patient brought in by wife.    Patient reports having low BP at home. Patient states last week he went to PCP and his CBG was too high. Patient decided to stop eating and lost 10lbs last week and lost 2lbs this week. Now patients states his BP is low when he checks it at home in the lower 70s.   Patient states he quit taking he high BP medication yesterday and has not had it since.   Patient reports passing out and falling yesterday. Patient states his son lowered him down to the floor. Denies hitting his head.    Hx. Hypertension.     BP in triage- 92/58 98% RA P-95

## 2019-01-23 LAB — BASIC METABOLIC PANEL
Anion gap: 11 (ref 5–15)
BUN: 47 mg/dL — ABNORMAL HIGH (ref 8–23)
CO2: 22 mmol/L (ref 22–32)
Calcium: 8.5 mg/dL — ABNORMAL LOW (ref 8.9–10.3)
Chloride: 105 mmol/L (ref 98–111)
Creatinine, Ser: 1.78 mg/dL — ABNORMAL HIGH (ref 0.61–1.24)
GFR calc Af Amer: 41 mL/min — ABNORMAL LOW (ref >60)
GFR calc non Af Amer: 35 mL/min — ABNORMAL LOW (ref >60)
Glucose, Bld: 166 mg/dL — ABNORMAL HIGH (ref 70–99)
Potassium: 3.4 mmol/L — ABNORMAL LOW (ref 3.5–5.1)
Sodium: 138 mmol/L (ref 135–145)

## 2019-01-23 NOTE — Discharge Instructions (Signed)
We recommend follow-up with your primary care doctor within the week to have your kidney function rechecked.  Drink plenty of fluids to prevent dehydration.  Do not resume your blood pressure medication until you follow-up with your doctor.  You may return for any new or concerning symptoms.

## 2019-01-25 ENCOUNTER — Other Ambulatory Visit: Payer: Self-pay

## 2019-01-25 ENCOUNTER — Encounter: Payer: Self-pay | Admitting: Family Medicine

## 2019-01-25 ENCOUNTER — Ambulatory Visit (INDEPENDENT_AMBULATORY_CARE_PROVIDER_SITE_OTHER): Payer: PPO | Admitting: Family Medicine

## 2019-01-25 VITALS — BP 116/72 | HR 95 | Temp 96.4°F | Ht 63.0 in | Wt 171.8 lb

## 2019-01-25 DIAGNOSIS — I951 Orthostatic hypotension: Secondary | ICD-10-CM

## 2019-01-25 DIAGNOSIS — N289 Disorder of kidney and ureter, unspecified: Secondary | ICD-10-CM

## 2019-01-25 DIAGNOSIS — E1142 Type 2 diabetes mellitus with diabetic polyneuropathy: Secondary | ICD-10-CM

## 2019-01-25 DIAGNOSIS — R55 Syncope and collapse: Secondary | ICD-10-CM | POA: Diagnosis not present

## 2019-01-25 NOTE — Progress Notes (Signed)
Chief Complaint  Patient presents with  . Hospitalization Follow-up    9/11- [facility]    HPI  Patient presents today for recheck of recent hospitalization. He went to E.D. for lightheadedness and syncope. Occurred two days before trip to E.D. Pt. Waited until wife returned from trip two days later to go to E.D. (E.D. report reviewed.) Had witnessed event. Son lowered him to ground so no injury occurred. Reports increase in sx was occurring with rising from seated position. His BP was 73A systolic at the time of the syncopal event. In E.D. he was found to be dehydrated. He was rehydrated with IVs and sent home. He has had no further symptoms since that event.   presents forFollow-up of diabetes. Patient checks blood sugar at home.   121 fasting and not checking postprandial Patient denies symptoms such as polyuria, polydipsia, excessive hunger, nausea No significant hypoglycemic spells noted. Medications reviewed. Pt reports taking them regularly without complication/adverse reaction being reported today.    PMH: Smoking status noted ROS: Review of Systems  Constitutional: Negative.   HENT: Negative.   Eyes: Negative for visual disturbance.  Respiratory: Negative for cough and shortness of breath.   Cardiovascular: Negative for chest pain and leg swelling.  Gastrointestinal: Negative for abdominal pain, diarrhea, nausea and vomiting.  Genitourinary: Negative for difficulty urinating.  Musculoskeletal: Negative for arthralgias and myalgias.  Skin: Negative for rash.  Neurological: Positive for dizziness, syncope, weakness, light-headedness and headaches. Negative for seizures and speech difficulty.  Psychiatric/Behavioral: Negative for sleep disturbance.     Objective: BP 116/72   Pulse 95   Temp (!) 96.4 F (35.8 C) (Temporal)   Ht _0  (1.6 m)   Wt 171 lb 12.8 oz (77.9 kg)   SpO2 100%   BMI 30.43 kg/m  Gen: NAD, alert, cooperative with exam HEENT: NCAT, EOMI, PERRL CV: RRR,  good S1/S2, no murmur Resp: CTABL, no wheezes, non-labored Abd: SNTND, BS present, no guarding or organomegaly Ext: No edema, warm Neuro: Alert and oriented, No gross deficits  Assessment and plan:  1. Orthostatic hypotension   2. Kidney function abnormal   3. Diabetic polyneuropathy associated with type 2 diabetes mellitus ([place])   4. Syncope and collapse     Limit carbs to 600 calories 120 gms a day. Hold amlodipine, losartan and meloxicam  Orders Placed This Encounter  Procedures  . CMP14+EGFR  . Magnesium  . Referral to Nutrition and Diabetes Services    Referral Priority:   Routine    Referral Type:   Consultation    Referral Reason:   Specialty Services Required    Number of Visits Requested:   1    Follow up 1 week Claretta Fraise, MD

## 2019-01-25 NOTE — Patient Instructions (Signed)
1.) limit carbs to 120 grams per day =  calories  2.) Hold amlodipine, losartan and meloxicam

## 2019-01-26 LAB — CMP14+EGFR
ALT: 29 IU/L (ref 0–44)
AST: 24 IU/L (ref 0–40)
Albumin/Globulin Ratio: 1.8 (ref 1.2–2.2)
Albumin: 4.2 g/dL (ref 3.7–4.7)
Alkaline Phosphatase: 69 IU/L (ref 39–117)
BUN/Creatinine Ratio: 18 (ref 10–24)
BUN: 29 mg/dL — ABNORMAL HIGH (ref 8–27)
Bilirubin Total: 0.8 mg/dL (ref 0.0–1.2)
CO2: 21 mmol/L (ref 20–29)
Calcium: 9.8 mg/dL (ref 8.6–10.2)
Chloride: 100 mmol/L (ref 96–106)
Creatinine, Ser: 1.63 mg/dL — ABNORMAL HIGH (ref 0.76–1.27)
GFR calc Af Amer: 46 mL/min/{1.73_m2} — ABNORMAL LOW (ref >59)
GFR calc non Af Amer: 39 mL/min/{1.73_m2} — ABNORMAL LOW (ref >59)
Globulin, Total: 2.4 g/dL (ref 1.5–4.5)
Glucose: 115 mg/dL — ABNORMAL HIGH (ref 65–99)
Potassium: 4.7 mmol/L (ref 3.5–5.2)
Sodium: 140 mmol/L (ref 134–144)
Total Protein: 6.6 g/dL (ref 6.0–8.5)

## 2019-01-26 LAB — MAGNESIUM: Magnesium: 1.9 mg/dL (ref 1.6–2.3)

## 2019-02-01 ENCOUNTER — Other Ambulatory Visit: Payer: Self-pay

## 2019-02-01 ENCOUNTER — Encounter: Payer: Self-pay | Admitting: Family Medicine

## 2019-02-02 ENCOUNTER — Encounter: Payer: Self-pay | Admitting: Family Medicine

## 2019-02-02 ENCOUNTER — Ambulatory Visit (INDEPENDENT_AMBULATORY_CARE_PROVIDER_SITE_OTHER): Payer: PPO | Admitting: Family Medicine

## 2019-02-02 VITALS — BP 135/73 | HR 73 | Temp 98.0°F | Resp 18 | Ht 63.0 in | Wt 173.0 lb

## 2019-02-02 DIAGNOSIS — I951 Orthostatic hypotension: Secondary | ICD-10-CM | POA: Diagnosis not present

## 2019-02-02 DIAGNOSIS — E119 Type 2 diabetes mellitus without complications: Secondary | ICD-10-CM

## 2019-02-02 NOTE — Progress Notes (Signed)
Subjective:  Patient ID: Jared Norman, male    DOB: [birth year]  Age: 78 y.o. MRN: [id number]  CC: Medical Management of Chronic Issues (1 week follow up )   HPI Jared Norman presents for Recheck of BP due to recent orthostasis and syncope episode. I took him off of his BP meds last week. Jared Norman says most of his readings are 248G systolic and 50I diastolic. No further dizziness or synocope   Depression screen [facility] 2/9 02/02/2019 01/12/2019 05/26/2018  Decreased Interest 0 0 0  Down, Depressed, Hopeless 0 0 0  PHQ - 2 Score 0 0 0    History Jared Norman has a past medical history of Arthritis, BPH (benign prostatic hyperplasia), Cataract, Gallstones, Hyperlipidemia, Hypertension, Kidney stones, Type 2 diabetes mellitus ([place]), and Vitamin D deficiency.   He has a past surgical history that includes Cystoscopy with retrograde pyelogram, ureteroscopy and stent placement (Left, 12/20/2013); Foot surgery (Right, 2008); Laparoscopic cholecystectomy (05/2018); and Cataract extraction, bilateral.   His family history includes ALS in his brother; Bladder Cancer in his father; Blindness in his father and paternal grandfather; Diabetes in his brother; Hypertension in his brother.He reports that he has never smoked. He has never used smokeless tobacco. He reports that he does not drink alcohol or use drugs.    ROS Review of Systems  Constitutional: Negative for fever.  Respiratory: Negative for shortness of breath.   Cardiovascular: Negative for chest pain.  Musculoskeletal: Negative for arthralgias.  Skin: Negative for rash.    Objective:  BP 135/73   Pulse 73   Temp 98 F (36.7 C)   Resp 18   Ht '5\' 3"'  (1.6 m)   Wt 173 lb (78.5 kg)   SpO2 100%   BMI 30.65 kg/m   BP Readings from Last 3 Encounters:  02/02/19 135/73  01/25/19 116/72  01/23/19 132/77    Wt Readings from Last 3 Encounters:  02/02/19 173 lb (78.5 kg)  01/25/19 171 lb 12.8 oz (77.9 kg)  01/12/19 180 lb (81.6 kg)      Physical Exam Vitals signs reviewed.  Constitutional:      Appearance: He is well-developed.  HENT:     Head: Normocephalic and atraumatic.     Right Ear: External ear normal.     Left Ear: External ear normal.     Mouth/Throat:     Pharynx: No oropharyngeal exudate or posterior oropharyngeal erythema.  Eyes:     Pupils: Pupils are equal, round, and reactive to light.  Neck:     Musculoskeletal: Normal range of motion and neck supple.  Cardiovascular:     Rate and Rhythm: Normal rate and regular rhythm.     Heart sounds: No murmur.  Pulmonary:     Effort: No respiratory distress.     Breath sounds: Normal breath sounds.  Neurological:     Mental Status: He is alert and oriented to person, place, and time.       Assessment & Plan:   Jared Norman was seen today for medical management of chronic issues.  Diagnoses and all orders for this visit:  Orthostatic hypotension -     BMP8+EGFR  Diabetes mellitus without complication ([place]) -     BMP8+EGFR       I am having Jared Norman maintain his magnesium gluconate, cholecalciferol, aspirin EC, Easy Touch Lancets 26G, glucose blood, meloxicam, metFORMIN, finasteride, simvastatin, pantoprazole, amLODipine, and losartan.  Allergies as of 02/02/2019   No Known Allergies  Medication List       Accurate as of February 02, 2019  5:54 PM. If you have any questions, ask your nurse or doctor.        amLODipine 5 MG tablet Commonly known as: NORVASC Take 1 tablet (5 mg total) by mouth daily.   aspirin EC 81 MG tablet Take 81 mg by mouth every Monday, Wednesday, and Friday.   cholecalciferol 1000 units tablet Commonly known as: VITAMIN D Take 2,000 Units by mouth daily.   Easy Touch Lancets 26G Misc Use to check BS daily DX E11.9   finasteride 5 MG tablet Commonly known as: PROSCAR Take 1 tablet (5 mg total) by mouth daily.   glucose blood test strip Test glucose up to 4 times daily   losartan 100 MG tablet  Commonly known as: COZAAR Take 0.5 tablets (50 mg total) by mouth daily.   magnesium gluconate 500 MG tablet Commonly known as: MAGONATE Take 1,000 mg by mouth daily.   meloxicam 15 MG tablet Commonly known as: MOBIC Take 1 tablet by mouth once daily   metFORMIN 1000 MG tablet Commonly known as: GLUCOPHAGE TAKE ONE TABLET BY MOUTH ONCE DAILY WITH BREAKFAST   pantoprazole 40 MG tablet Commonly known as: PROTONIX Take 1 tablet (40 mg total) by mouth 2 (two) times daily.   simvastatin 40 MG tablet Commonly known as: ZOCOR Take 0.5 tablets (20 mg total) by mouth at bedtime. What changed: when to take this        Follow-up: Return in about 2 weeks (around 02/16/2019) for hypertension.  Jared Norman, M.D.

## 2019-02-03 LAB — BMP8+EGFR
BUN/Creatinine Ratio: 13 (ref 10–24)
BUN: 18 mg/dL (ref 8–27)
CO2: 22 mmol/L (ref 20–29)
Calcium: 9.5 mg/dL (ref 8.6–10.2)
Chloride: 101 mmol/L (ref 96–106)
Creatinine, Ser: 1.4 mg/dL — ABNORMAL HIGH (ref 0.76–1.27)
GFR calc Af Amer: 55 mL/min/{1.73_m2} — ABNORMAL LOW (ref >59)
GFR calc non Af Amer: 47 mL/min/{1.73_m2} — ABNORMAL LOW (ref >59)
Glucose: 111 mg/dL — ABNORMAL HIGH (ref 65–99)
Potassium: 4.6 mmol/L (ref 3.5–5.2)
Sodium: 139 mmol/L (ref 134–144)

## 2019-02-03 NOTE — Progress Notes (Signed)
Hello [place],  Your lab result is normal and/or stable.Some minor variations that are not significant are commonly marked abnormal, but do not represent any medical problem for you.  Best regards, Shanitra Phillippi, M.D.

## 2019-02-10 ENCOUNTER — Ambulatory Visit (INDEPENDENT_AMBULATORY_CARE_PROVIDER_SITE_OTHER): Payer: PPO | Admitting: *Deleted

## 2019-02-10 DIAGNOSIS — Z0000 Encounter for general adult medical examination without abnormal findings: Secondary | ICD-10-CM

## 2019-02-10 NOTE — Progress Notes (Addendum)
MEDICARE ANNUAL WELLNESS VISIT  02/10/2019  Telephone Visit Disclaimer This Medicare AWV was conducted by telephone due to national recommendations for restrictions regarding the COVID-19 Pandemic (e.g. social distancing).  I verified, using two identifiers, that I am speaking with Jared Norman or their authorized healthcare agent. I discussed the limitations, risks, security, and privacy concerns of performing an evaluation and management service by telephone and the potential availability of an in-person appointment in the future. The patient expressed understanding and agreed to proceed.   Subjective:  Jared Norman is a 78 y.o. male patient of Stacks, Cletus Gash, MD who had a Medicare Annual Wellness Visit today via telephone. 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 he has 7 children. he reports that he is socially active and does interact with friends/family regularly. he is moderately physically active as he has recently started a new home exercise routine and enjoys playing music on his guitar, piano, organ and getting together with his family to play music together.  Patient Care Team: Claretta Fraise, MD as PCP - General (Family Medicine) Satira Sark, MD as PCP - Cardiology (Cardiology) Daneil Dolin, MD as Consulting Physician (Gastroenterology)  Advanced Directives 02/10/2019 01/22/2019 01/21/2018 07/24/2015 12/27/2013 12/20/2013 12/20/2013  Does Patient Have a Medical Advance Directive? No No No No No Patient does not have advance directive;Patient would not like information -  Would patient like information on creating a medical advance directive? No - Patient declined No - Patient declined Yes (MAU/Ambulatory/Procedural Areas - Information given) - No - patient declined information - -  Pre-existing out of facility DNR order (yellow form or pink MOST form) - - - - - No No    Hospital Utilization Over the Past 12 Months:  # of hospitalizations or ER visits: 2 # of surgeries: 1  Review of Systems    Patient reports that his overall health is unchanged compared to last year.  History obtained from chart review  Patient Reported Readings (BP, Pulse, CBG, Weight, etc) none  Pain Assessment Pain : No/denies pain     Current Medications & Allergies (verified) Allergies as of 02/10/2019   No Known Allergies     Medication List       Accurate as of February 10, 2019  9:11 AM. If you have any questions, ask your nurse or doctor.        STOP taking these medications   amLODipine 5 MG tablet Commonly known as: NORVASC     TAKE these medications   aspirin EC 81 MG tablet Take 81 mg by mouth every Monday, Wednesday, and Friday.   cholecalciferol 1000 units tablet Commonly known as: VITAMIN D Take 2,000 Units by mouth daily.   Easy Touch Lancets 26G Misc Use to check BS daily DX E11.9   finasteride 5 MG tablet Commonly known as: PROSCAR Take 1 tablet (5 mg total) by mouth daily.   glucose blood test strip Test glucose up to 4 times daily   losartan 100 MG tablet Commonly known as: COZAAR Take 0.5 tablets (50 mg total) by mouth daily.   magnesium gluconate 500 MG tablet Commonly known as: MAGONATE Take 1,000 mg by mouth daily.   meloxicam 15 MG tablet Commonly known as: MOBIC Take 1 tablet by mouth once daily   metFORMIN 1000 MG tablet Commonly known as: GLUCOPHAGE TAKE ONE TABLET BY MOUTH ONCE DAILY WITH BREAKFAST   pantoprazole 40 MG tablet Commonly  known as: PROTONIX Take 1 tablet (40 mg total) by mouth 2 (two) times daily.   simvastatin 40 MG tablet Commonly known as: ZOCOR Take 0.5 tablets (20 mg total) by mouth at bedtime. What changed: when to take this       History (reviewed): Past Medical History:  Diagnosis Date  . Arthritis   . BPH (benign prostatic hyperplasia)   . Cataract   . Gallstones   . Hyperlipidemia   . Hypertension   . Kidney stones   .  Type 2 diabetes mellitus ([place])   . Vitamin D deficiency    Past Surgical History:  Procedure Laterality Date  . CATARACT EXTRACTION, BILATERAL    . CYSTOSCOPY WITH RETROGRADE PYELOGRAM, URETEROSCOPY AND STENT PLACEMENT Left 12/20/2013   Procedure: CYSTOSCOPY WITHLEFT RETROGRADE PYELOGRAM,  AND LEFT STENT PLACEMENT;  Surgeon: Malka So, MD;  Location: [facility];  Service: Urology;  Laterality: Left;  . FOOT SURGERY Right 2008   Tendon rupture  . LAPAROSCOPIC CHOLECYSTECTOMY  05/2018   Dr. Ladona Horns   Family History  Problem Relation Age of Onset  . ALS Brother        Agent Orange  . Hypertension Brother   . Diabetes Brother   . Bladder Cancer Father   . Blindness Father   . Blindness Paternal Grandfather    Social History   Socioeconomic History  . Marital status: Married    Spouse name: Jared Norman  . Number of children: 7  . Years of education: 12th grade  . Highest education level: High school graduate  Occupational History  . Occupation: Company secretary  . Occupation: [employer]  . Financial resource strain: Not hard at all  . Food insecurity    Worry: Never true    Inability: Never true  . Transportation needs    Medical: No    Non-medical: No  Tobacco Use  . Smoking status: Never Smoker  . Smokeless tobacco: Never Used  Substance and Sexual Activity  . Alcohol use: No  . Drug use: No  . Sexual activity: Not Currently  Lifestyle  . Physical activity    Days per week: 7 days    Minutes per session: 60 min  . Stress: Not at all  Relationships  . Social connections    Talks on phone: More than three times a week    Gets together: More than three times a week    Attends religious service: More than 4 times per year    Active member of club or organization: Yes    Attends meetings of clubs or organizations: More than 4 times per year    Relationship status: Married  Other Topics Concern  . Not on file  Social History Narrative  . Not on file     Activities of Daily Living In your present state of health, do you have any difficulty performing the following activities: 02/10/2019  Hearing? Y  Comment wears bilateral hearing aids  Vision? Y  Comment gets injections in eye every 5-6 weeks due to collection of fluid behind retinas-wears glasses which helps but has more trouble at night driving when it is raining-does get yearly diabetic eye exam  Difficulty concentrating or making decisions? N  Walking or climbing stairs? Y  Comment pt states it feels like "it is more difficult to lift my foot up to take a step" he hasn't had a fall due to that and doesn't need a cane or walker  Dressing or bathing? N  Doing errands, shopping? N  Preparing Food and eating ? N  Using the Toilet? N  In the past six months, have you accidently leaked urine? N  Do you have problems with loss of bowel control? N  Managing your Medications? N  Managing your Finances? N  Housekeeping or managing your Housekeeping? N  Some recent data might be hidden    Patient Education/ Literacy How often do you need to have someone help you when you read instructions, pamphlets, or other written materials from your doctor or pharmacy?: 1 - Never What is the last grade level you completed in school?: 12th grade  Exercise Current Exercise Habits: Home exercise routine, Type of exercise: walking;Other - see comments(stationary bike), Time (Minutes): 60, Frequency (Times/Week): 7, Weekly Exercise (Minutes/Week): 420, Intensity: Mild, Exercise limited by: orthopedic condition(s)  Diet Patient reports consuming 3 meals a day and 0 snack(s) a day Patient reports that his primary diet is: Diabetic Patient reports that she does have regular access to food.   Depression Screen PHQ 2/9 Scores 02/10/2019 02/02/2019 01/12/2019 05/26/2018 03/02/2018 01/21/2018 11/26/2017  PHQ - 2 Score 0 0 0 0 0 0 0     Fall Risk Fall Risk  02/10/2019 02/02/2019 01/12/2019 05/26/2018 03/02/2018  Falls  in the past year? 1 1 0 0 No  Comment pt passed out 3 weeks ago due to BP dropping-pt has been seen at ED and has been evaluated by Dr Livia Snellen - - - -  Number falls in past yr: 0 0 - - -  Injury with Fall? 0 0 - - -  Risk Factor Category  - - - - -  Risk for fall due to : Other (Comment) - - - -  Risk for fall due to: Comment pt had recently went on a "crash diet" this is what he believes caused the problem - - - -  Follow up Falls prevention discussed - - - -  Comment Get rid of all throw rugs in the house, adequate lighting in the walkways and advised to get grab bars in the bathroom - - - -     Objective:  Jared Norman seemed alert and oriented and he participated appropriately during our telephone visit.  Blood Pressure Weight BMI  BP Readings from Last 3 Encounters:  02/02/19 135/73  01/25/19 116/72  01/23/19 132/77   Wt Readings from Last 3 Encounters:  02/02/19 173 lb (78.5 kg)  01/25/19 171 lb 12.8 oz (77.9 kg)  01/12/19 180 lb (81.6 kg)   BMI Readings from Last 1 Encounters:  02/02/19 30.65 kg/m    *Unable to obtain current vital signs, weight, and BMI due to telephone visit type  Hearing/Vision  . Salomon did not seem to have difficulty with hearing/understanding during the telephone conversation . Reports that he has had a formal eye exam by an eye care professional within the past year . Reports that he has had a formal hearing evaluation within the past year *Unable to fully assess hearing and vision during telephone visit type  Cognitive Function: 6CIT Screen 02/10/2019  What Year? 0 points  What month? 0 points  What time? 0 points  Count back from 20 0 points  Months in reverse 0 points  Repeat phrase 0 points  Total Score 0   (Normal:0-7, Significant for Dysfunction: >8)  Normal Cognitive Function Screening: Yes   Immunization & Health Maintenance Record Immunization History  Administered Date(s) Administered  . Influenza, High Dose Seasonal PF  01/21/2018  .  Influenza,inj,Quad PF,6+ Mos 02/26/2013, 04/19/2014, 02/20/2015, 02/15/2016, 02/07/2017  . Pneumococcal Conjugate-13 11/18/2014  . Pneumococcal Polysaccharide-23 05/13/2010  . Tdap 12/07/2013  . Zoster Recombinat (Shingrix) 01/21/2018, 05/09/2018    Health Maintenance  Topic Date Due  . OPHTHALMOLOGY EXAM  07/03/2018  . INFLUENZA VACCINE  08/11/2019 (Originally 12/12/2018)  . FOOT EXAM  03/03/2019  . HEMOGLOBIN A1C  07/12/2019  . TETANUS/TDAP  12/08/2023  . PNA vac Low Risk Adult  Completed       Assessment  This is a routine wellness examination for DEERIC KNAUF.  Health Maintenance: Due or Overdue Health Maintenance Due  Topic Date Due  . OPHTHALMOLOGY EXAM  07/03/2018    Jared Norman does not need a referral for Community Assistance: Care Management:   no Social Work:    no Prescription Assistance:  no Nutrition/Diabetes Education:  no   Plan:  Personalized Goals Goals Addressed            This Visit's Progress   . DIET - INCREASE WATER INTAKE       Try to drink 6-8 glasses of water daily.      Personalized Health Maintenance & Screening Recommendations  Influenza vaccine Advanced directives: has NO advanced directive - not interested in additional information  Lung Cancer Screening Recommended: no (Low Dose CT Chest recommended if Age 74-80 years, 30 pack-year currently smoking OR have quit w/in past 15 years) Hepatitis C Screening recommended: no HIV Screening recommended: no  Advanced Directives: Written information was not prepared per patient's request.  Referrals & Orders No orders of the defined types were placed in this encounter.   Follow-up Plan . Follow-up with Claretta Fraise, MD as planned . Get your Flu vaccine at your next visit with your PCP   I have personally reviewed and noted the following in the patient's chart:   . Medical and social history . Use of alcohol, tobacco or illicit drugs  . Current medications  and supplements . Functional ability and status . Nutritional status . Physical activity . Advanced directives . List of other physicians . Hospitalizations, surgeries, and ER visits in previous 12 months . Vitals . Screenings to include cognitive, depression, and falls . Referrals and appointments  In addition, I have reviewed and discussed with Jared Norman certain preventive protocols, quality metrics, and best practice recommendations. A written personalized care plan for preventive services as well as general preventive health recommendations is available and can be mailed to the patient at his request.      Milas Hock, LPN  [id number]   I have reviewed and agree with the above AWV documentation.   Mary-Margaret Hassell Done, FNP

## 2019-02-10 NOTE — Patient Instructions (Signed)
Preventive Care 75 Years and Older, Male Preventive care refers to lifestyle choices and visits with your health care provider that can promote health and wellness. This includes:  A yearly physical exam. This is also called an annual well check.  Regular dental and eye exams.  Immunizations.  Screening for certain conditions.  Healthy lifestyle choices, such as diet and exercise. What can I expect for my preventive care visit? Physical exam Your health care provider will check:  Height and weight. These may be used to calculate body mass index (BMI), which is a measurement that tells if you are at a healthy weight.  Heart rate and blood pressure.  Your skin for abnormal spots. Counseling Your health care provider may ask you questions about:  Alcohol, tobacco, and drug use.  Emotional well-being.  Home and relationship well-being.  Sexual activity.  Eating habits.  History of falls.  Memory and ability to understand (cognition).  Work and work Statistician. What immunizations do I need?  Influenza (flu) vaccine  This is recommended every year. Tetanus, diphtheria, and pertussis (Tdap) vaccine  You may need a Td booster every 10 years. Varicella (chickenpox) vaccine  You may need this vaccine if you have not already been vaccinated. Zoster (shingles) vaccine  You may need this after age 50. Pneumococcal conjugate (PCV13) vaccine  One dose is recommended after age 24. Pneumococcal polysaccharide (PPSV23) vaccine  One dose is recommended after age 33. Measles, mumps, and rubella (MMR) vaccine  You may need at least one dose of MMR if you were born in 1957 or later. You may also need a second dose. Meningococcal conjugate (MenACWY) vaccine  You may need this if you have certain conditions. Hepatitis A vaccine  You may need this if you have certain conditions or if you travel or work in places where you may be exposed to hepatitis A. Hepatitis B vaccine   You may need this if you have certain conditions or if you travel or work in places where you may be exposed to hepatitis B. Haemophilus influenzae type b (Hib) vaccine  You may need this if you have certain conditions. You may receive vaccines as individual doses or as more than one vaccine together in one shot (combination vaccines). Talk with your health care provider about the risks and benefits of combination vaccines. What tests do I need? Blood tests  Lipid and cholesterol levels. These may be checked every 5 years, or more frequently depending on your overall health.  Hepatitis C test.  Hepatitis B test. Screening  Lung cancer screening. You may have this screening every year starting at age 74 if you have a 30-pack-year history of smoking and currently smoke or have quit within the past 15 years.  Colorectal cancer screening. All adults should have this screening starting at age 57 and continuing until age 54. Your health care provider may recommend screening at age 47 if you are at increased risk. You will have tests every 1-10 years, depending on your results and the type of screening test.  Prostate cancer screening. Recommendations will vary depending on your family history and other risks.  Diabetes screening. This is done by checking your blood sugar (glucose) after you have not eaten for a while (fasting). You may have this done every 1-3 years.  Abdominal aortic aneurysm (AAA) screening. You may need this if you are a current or former smoker.  Sexually transmitted disease (STD) testing. Follow these instructions at home: Eating and drinking  Eat  a diet that includes fresh fruits and vegetables, whole grains, lean protein, and low-fat dairy products. Limit your intake of foods with high amounts of sugar, saturated fats, and salt.  Take vitamin and mineral supplements as recommended by your health care provider.  Do not drink alcohol if your health care provider  tells you not to drink.  If you drink alcohol: ? Limit how much you have to 0-2 drinks a day. ? Be aware of how much alcohol is in your drink. In the [place] one drink equals one 12 oz bottle of beer (355 mL), one 5 oz glass of wine (148 mL), or one 1 oz glass of hard liquor (44 mL). Lifestyle  Take daily care of your teeth and gums.  Stay active. Exercise for at least 30 minutes on 5 or more days each week.  Do not use any products that contain nicotine or tobacco, such as cigarettes, e-cigarettes, and chewing tobacco. If you need help quitting, ask your health care provider.  If you are sexually active, practice safe sex. Use a condom or other form of protection to prevent STIs (sexually transmitted infections).  Talk with your health care provider about taking a low-dose aspirin or statin. What's next?  Visit your health care provider once a year for a well check visit.  Ask your health care provider how often you should have your eyes and teeth checked.  Stay up to date on all vaccines. This information is not intended to replace advice given to you by your health care provider. Make sure you discuss any questions you have with your health care provider. Document Released: 05/26/2015 Document Revised: 04/23/2018 Document Reviewed: 04/23/2018 Elsevier Patient Education  2020 Elsevier Inc.  

## 2019-02-15 ENCOUNTER — Other Ambulatory Visit: Payer: Self-pay

## 2019-02-16 ENCOUNTER — Ambulatory Visit (INDEPENDENT_AMBULATORY_CARE_PROVIDER_SITE_OTHER): Payer: PPO | Admitting: Family Medicine

## 2019-02-16 ENCOUNTER — Encounter: Payer: Self-pay | Admitting: Family Medicine

## 2019-02-16 ENCOUNTER — Other Ambulatory Visit: Payer: Self-pay

## 2019-02-16 ENCOUNTER — Ambulatory Visit: Payer: PPO | Admitting: Family Medicine

## 2019-02-16 VITALS — BP 107/66 | HR 101 | Temp 98.2°F | Resp 20 | Ht 63.0 in | Wt 167.0 lb

## 2019-02-16 DIAGNOSIS — I951 Orthostatic hypotension: Secondary | ICD-10-CM

## 2019-02-16 DIAGNOSIS — Z23 Encounter for immunization: Secondary | ICD-10-CM | POA: Diagnosis not present

## 2019-02-16 DIAGNOSIS — L570 Actinic keratosis: Secondary | ICD-10-CM | POA: Diagnosis not present

## 2019-02-16 NOTE — Progress Notes (Signed)
Chief Complaint  Patient presents with  . Medical Management of Chronic Issues    2 week follow up     HPI  Patient presents today for recheck of BP. He was taken off of all antihypertensive medications as a result of orthostatic hypotension driven syncope. He is following his blood pressure at home. The systolic readings are running from 100-120 consistently. He has had no further syncope.  PMH: Smoking status noted ROS: Per HPI  Objective: BP 107/66   Pulse (!) 101   Temp 98.2 F (36.8 C)   Resp 20   Ht _0  (1.6 m)   Wt 167 lb (75.8 kg)   SpO2 97%   BMI 29.58 kg/m  Gen: NAD, alert, cooperative with exam HEENT: NCAT, EOMI, PERRL CV: RRR, good S1/S2, no murmur Resp: CTABL, no wheezes, non-labored Abd: SNTND, BS present, no guarding or organomegaly Ext: No edema, warm Neuro: Alert and oriented, No gross deficits  Assessment and plan:  1. Orthostatic hypotension   2. Need for immunization against influenza     Orders Placed This Encounter  Procedures  . Flu Vaccine QUAD High Dose(Fluad)  . CBC with Differential/Platelet  . CMP14+EGFR    Follow up as needed.  Claretta Fraise, MD

## 2019-02-17 ENCOUNTER — Encounter: Payer: Self-pay | Admitting: Family Medicine

## 2019-02-17 LAB — CBC WITH DIFFERENTIAL/PLATELET
Basophils Absolute: 0 10*3/uL (ref 0.0–0.2)
Basos: 1 %
EOS (ABSOLUTE): 0.1 10*3/uL (ref 0.0–0.4)
Eos: 1 %
Hematocrit: 40.1 % (ref 37.5–51.0)
Hemoglobin: 13.4 g/dL (ref 13.0–17.7)
Immature Grans (Abs): 0 10*3/uL (ref 0.0–0.1)
Immature Granulocytes: 0 %
Lymphocytes Absolute: 1.9 10*3/uL (ref 0.7–3.1)
Lymphs: 28 %
MCH: 29.7 pg (ref 26.6–33.0)
MCHC: 33.4 g/dL (ref 31.5–35.7)
MCV: 89 fL (ref 79–97)
Monocytes Absolute: 0.7 10*3/uL (ref 0.1–0.9)
Monocytes: 10 %
Neutrophils Absolute: 4 10*3/uL (ref 1.4–7.0)
Neutrophils: 60 %
Platelets: 263 10*3/uL (ref 150–450)
RBC: 4.51 x10E6/uL (ref 4.14–5.80)
RDW: 13.9 % (ref 11.6–15.4)
WBC: 6.8 10*3/uL (ref 3.4–10.8)

## 2019-02-17 LAB — CMP14+EGFR
ALT: 26 IU/L (ref 0–44)
AST: 21 IU/L (ref 0–40)
Albumin/Globulin Ratio: 1.8 (ref 1.2–2.2)
Albumin: 4.2 g/dL (ref 3.7–4.7)
Alkaline Phosphatase: 68 IU/L (ref 39–117)
BUN/Creatinine Ratio: 17 (ref 10–24)
BUN: 26 mg/dL (ref 8–27)
Bilirubin Total: 1.2 mg/dL (ref 0.0–1.2)
CO2: 22 mmol/L (ref 20–29)
Calcium: 9.6 mg/dL (ref 8.6–10.2)
Chloride: 99 mmol/L (ref 96–106)
Creatinine, Ser: 1.53 mg/dL — ABNORMAL HIGH (ref 0.76–1.27)
GFR calc Af Amer: 49 mL/min/{1.73_m2} — ABNORMAL LOW (ref >59)
GFR calc non Af Amer: 43 mL/min/{1.73_m2} — ABNORMAL LOW (ref >59)
Globulin, Total: 2.3 g/dL (ref 1.5–4.5)
Glucose: 152 mg/dL — ABNORMAL HIGH (ref 65–99)
Potassium: 5 mmol/L (ref 3.5–5.2)
Sodium: 141 mmol/L (ref 134–144)
Total Protein: 6.5 g/dL (ref 6.0–8.5)

## 2019-02-24 ENCOUNTER — Encounter (INDEPENDENT_AMBULATORY_CARE_PROVIDER_SITE_OTHER): Payer: PPO | Admitting: Ophthalmology

## 2019-02-24 DIAGNOSIS — H43813 Vitreous degeneration, bilateral: Secondary | ICD-10-CM | POA: Diagnosis not present

## 2019-02-24 DIAGNOSIS — E113513 Type 2 diabetes mellitus with proliferative diabetic retinopathy with macular edema, bilateral: Secondary | ICD-10-CM

## 2019-02-24 DIAGNOSIS — E11311 Type 2 diabetes mellitus with unspecified diabetic retinopathy with macular edema: Secondary | ICD-10-CM | POA: Diagnosis not present

## 2019-02-24 DIAGNOSIS — H35033 Hypertensive retinopathy, bilateral: Secondary | ICD-10-CM

## 2019-02-24 DIAGNOSIS — I10 Essential (primary) hypertension: Secondary | ICD-10-CM

## 2019-03-31 ENCOUNTER — Encounter (INDEPENDENT_AMBULATORY_CARE_PROVIDER_SITE_OTHER): Payer: PPO | Admitting: Ophthalmology

## 2019-03-31 DIAGNOSIS — E11311 Type 2 diabetes mellitus with unspecified diabetic retinopathy with macular edema: Secondary | ICD-10-CM | POA: Diagnosis not present

## 2019-03-31 DIAGNOSIS — I10 Essential (primary) hypertension: Secondary | ICD-10-CM

## 2019-03-31 DIAGNOSIS — H35033 Hypertensive retinopathy, bilateral: Secondary | ICD-10-CM

## 2019-03-31 DIAGNOSIS — E113513 Type 2 diabetes mellitus with proliferative diabetic retinopathy with macular edema, bilateral: Secondary | ICD-10-CM | POA: Diagnosis not present

## 2019-03-31 DIAGNOSIS — H43813 Vitreous degeneration, bilateral: Secondary | ICD-10-CM | POA: Diagnosis not present

## 2019-04-12 ENCOUNTER — Other Ambulatory Visit: Payer: Self-pay

## 2019-04-12 ENCOUNTER — Telehealth: Payer: Self-pay | Admitting: Family Medicine

## 2019-04-12 NOTE — Chronic Care Management (AMB) (Signed)
°  Chronic Care Management   Outreach Note  04/12/2019 Name: Jared Norman MRN: [id number] DOB: [birth year]  Referred by: Claretta Fraise, MD Reason for referral : No chief complaint on file.   Third unsuccessful telephone outreach was attempted today. The patient was referred to the case management team for assistance with care management and care coordination. The patient's primary care provider has been notified of our unsuccessful attempts to make or maintain contact with the patient. The care management team is pleased to engage with this patient at any time in the future should he/she be interested in assistance from the care management team.   Follow Up Plan: The care management team is available to follow up with the patient after provider conversation with the patient regarding recommendation for care management engagement and subsequent re-referral to the care management team.   [facility]  [place] [employer], [place] [id number] Direct Dial: [email or web address]  Website: [email or web address]

## 2019-04-13 ENCOUNTER — Ambulatory Visit (INDEPENDENT_AMBULATORY_CARE_PROVIDER_SITE_OTHER): Payer: PPO | Admitting: Family Medicine

## 2019-04-13 ENCOUNTER — Encounter: Payer: Self-pay | Admitting: Family Medicine

## 2019-04-13 DIAGNOSIS — E119 Type 2 diabetes mellitus without complications: Secondary | ICD-10-CM | POA: Diagnosis not present

## 2019-04-13 DIAGNOSIS — M7731 Calcaneal spur, right foot: Secondary | ICD-10-CM | POA: Diagnosis not present

## 2019-04-13 DIAGNOSIS — K219 Gastro-esophageal reflux disease without esophagitis: Secondary | ICD-10-CM | POA: Diagnosis not present

## 2019-04-13 DIAGNOSIS — I10 Essential (primary) hypertension: Secondary | ICD-10-CM | POA: Diagnosis not present

## 2019-04-13 MED ORDER — LOSARTAN POTASSIUM 25 MG PO TABS: 25.0000 mg | ORAL_TABLET | Freq: Every day | ORAL | 1 refills | Status: DC

## 2019-04-13 MED ORDER — MELOXICAM 15 MG PO TABS: 15.0000 mg | ORAL_TABLET | Freq: Every day | ORAL | 1 refills | Status: DC

## 2019-04-13 NOTE — Progress Notes (Signed)
Subjective:  Patient ID: Jared Norman,  male    DOB: 1941-04-29  Age: 78 y.o.    CC: No chief complaint on file.   HPI MILLAN OTTS presents for  follow-up of hypertension. Patient has no history of headache chest pain or shortness of breath or recent cough. Patient also denies symptoms of TIA such as numbness weakness lateralizing. Patient denies side effects from medication. States taking it regularly. BP reading was 132/ 76 this AM.Has been as low as [id number] systolic.   Patient also  in for follow-up of elevated cholesterol. Doing well without complaints on current medication. Denies side effects  including myalgia and arthralgia and nausea. Also in today for liver function testing. Currently no chest pain, shortness of breath or other cardiovascular related symptoms noted.  Follow-up of diabetes. Patient does check blood sugar at home. Readings run between 100-110. Watching diet closely. and  A little higher after meals.  Patient denies symptoms such as excessive hunger or urinary frequency, excessive hunger, nausea No significant hypoglycemic spells noted. Medications reviewed. Pt reports taking them regularly. Pt. denies complication/adverse reaction today.   Patient in for follow-up of GERD. Currently asymptomatic taking  PPI daily. There is no chest pain or heartburn. No hematemesis and no melena. No dysphagia or choking. Onset is remote. Progression is stable. Complicating factors, none.    History Etta has a past medical history of Arthritis, BPH (benign prostatic hyperplasia), Cataract, Gallstones, Hyperlipidemia, Hypertension, Kidney stones, Type 2 diabetes mellitus ([place]), and Vitamin D deficiency.   He has a past surgical history that includes Cystoscopy with retrograde pyelogram, ureteroscopy and stent placement (Left, 12/20/2013); Foot surgery (Right, 2008); Laparoscopic cholecystectomy (05/2018); and Cataract extraction, bilateral.   His family history includes ALS in  his brother; Bladder Cancer in his father; Blindness in his father and paternal grandfather; Diabetes in his brother; Hypertension in his brother.He reports that he has never smoked. He has never used smokeless tobacco. He reports that he does not drink alcohol or use drugs.  Current Outpatient Medications on File Prior to Visit  Medication Sig Dispense Refill  . aspirin EC 81 MG tablet Take 81 mg by mouth every Monday, Wednesday, and Friday.    . cholecalciferol (VITAMIN D) 1000 UNITS tablet Take 2,000 Units by mouth daily.    Marland Kitchen EASY TOUCH LANCETS 26G MISC Use to check BS daily DX E11.9 100 each 3  . finasteride (PROSCAR) 5 MG tablet Take 1 tablet (5 mg total) by mouth daily. 90 tablet 1  . glucose blood test strip Test glucose up to 4 times daily 100 each 12  . magnesium gluconate (MAGONATE) 500 MG tablet Take 1,000 mg by mouth daily.     . metFORMIN (GLUCOPHAGE) 1000 MG tablet TAKE ONE TABLET BY MOUTH ONCE DAILY WITH BREAKFAST 90 tablet 1  . pantoprazole (PROTONIX) 40 MG tablet Take 1 tablet (40 mg total) by mouth 2 (two) times daily. 180 tablet 1  . simvastatin (ZOCOR) 40 MG tablet Take 0.5 tablets (20 mg total) by mouth at bedtime. (Patient taking differently: Take 20 mg by mouth daily. ) 90 tablet 1   No current facility-administered medications on file prior to visit.     ROS Review of Systems  Constitutional: Negative.   HENT: Negative.   Eyes: Negative for visual disturbance.  Respiratory: Negative for cough and shortness of breath.   Cardiovascular: Negative for chest pain and leg swelling.  Gastrointestinal: Negative for abdominal pain, diarrhea, nausea and vomiting.  Genitourinary: Negative for difficulty urinating.  Musculoskeletal: Negative for arthralgias and myalgias.  Skin: Negative for rash.  Neurological: Negative for dizziness (resolved with change of diet.), syncope and headaches.  Psychiatric/Behavioral: Negative for sleep disturbance.    Objective:  There were  no vitals taken for this visit.  BP Readings from Last 3 Encounters:  02/16/19 107/66  02/02/19 135/73  01/25/19 116/72    Wt Readings from Last 3 Encounters:  02/16/19 167 lb (75.8 kg)  02/02/19 173 lb (78.5 kg)  01/25/19 171 lb 12.8 oz (77.9 kg)     Physical Exam  Exam deferred. Pt. Harboring due to COVID 19. Phone visit performed.     Assessment & Plan:   Diagnoses and all orders for this visit:  Diabetes mellitus without complication (HCC)  Gastroesophageal reflux disease without esophagitis  Essential hypertension  Heel spur, right -     meloxicam (MOBIC) 15 MG tablet; Take 1 tablet (15 mg total) by mouth daily.  Other orders -     losartan (COZAAR) 25 MG tablet; Take 1 tablet (25 mg total) by mouth daily.   I have changed Benard L. Watanabe's losartan and meloxicam. I am also having him maintain his magnesium gluconate, cholecalciferol, aspirin EC, Easy Touch Lancets 26G, glucose blood, metFORMIN, finasteride, simvastatin, and pantoprazole.  Virtual Visit via telephone Note  I discussed the limitations, risks, security and privacy concerns of performing an evaluation and management service by telephone and the availability of in person appointments. I also discussed with the patient that there may be a patient responsible charge related to this service. The patient expressed understanding and agreed to proceed. Pt. Is at home. Dr. Livia Snellen is in his office.  Follow Up Instructions:   I discussed the assessment and treatment plan with the patient. The patient was provided an opportunity to ask questions and all were answered. The patient agreed with the plan and demonstrated an understanding of the instructions.   The patient was advised to call back or seek an in-person evaluation if the symptoms worsen or if the condition fails to improve as anticipated.  Total minutes including chart review and phone contact time: 22    Follow-up: No follow-ups on file.   Claretta Fraise, M.D.

## 2019-04-28 ENCOUNTER — Encounter (INDEPENDENT_AMBULATORY_CARE_PROVIDER_SITE_OTHER): Payer: PPO | Admitting: Ophthalmology

## 2019-04-28 ENCOUNTER — Other Ambulatory Visit: Payer: Self-pay

## 2019-04-28 DIAGNOSIS — I10 Essential (primary) hypertension: Secondary | ICD-10-CM

## 2019-04-28 DIAGNOSIS — H43813 Vitreous degeneration, bilateral: Secondary | ICD-10-CM

## 2019-04-28 DIAGNOSIS — E11311 Type 2 diabetes mellitus with unspecified diabetic retinopathy with macular edema: Secondary | ICD-10-CM | POA: Diagnosis not present

## 2019-04-28 DIAGNOSIS — E113513 Type 2 diabetes mellitus with proliferative diabetic retinopathy with macular edema, bilateral: Secondary | ICD-10-CM

## 2019-04-28 DIAGNOSIS — H35033 Hypertensive retinopathy, bilateral: Secondary | ICD-10-CM

## 2019-05-26 ENCOUNTER — Encounter (INDEPENDENT_AMBULATORY_CARE_PROVIDER_SITE_OTHER): Payer: PPO | Admitting: Ophthalmology

## 2019-05-26 DIAGNOSIS — E11311 Type 2 diabetes mellitus with unspecified diabetic retinopathy with macular edema: Secondary | ICD-10-CM | POA: Diagnosis not present

## 2019-05-26 DIAGNOSIS — E113513 Type 2 diabetes mellitus with proliferative diabetic retinopathy with macular edema, bilateral: Secondary | ICD-10-CM

## 2019-05-26 DIAGNOSIS — H35033 Hypertensive retinopathy, bilateral: Secondary | ICD-10-CM | POA: Diagnosis not present

## 2019-05-26 DIAGNOSIS — H43813 Vitreous degeneration, bilateral: Secondary | ICD-10-CM

## 2019-05-26 DIAGNOSIS — I10 Essential (primary) hypertension: Secondary | ICD-10-CM

## 2019-06-23 ENCOUNTER — Encounter (INDEPENDENT_AMBULATORY_CARE_PROVIDER_SITE_OTHER): Payer: PPO | Admitting: Ophthalmology

## 2019-06-23 DIAGNOSIS — E113513 Type 2 diabetes mellitus with proliferative diabetic retinopathy with macular edema, bilateral: Secondary | ICD-10-CM | POA: Diagnosis not present

## 2019-06-23 DIAGNOSIS — H35033 Hypertensive retinopathy, bilateral: Secondary | ICD-10-CM

## 2019-06-23 DIAGNOSIS — E11311 Type 2 diabetes mellitus with unspecified diabetic retinopathy with macular edema: Secondary | ICD-10-CM

## 2019-06-23 DIAGNOSIS — I10 Essential (primary) hypertension: Secondary | ICD-10-CM | POA: Diagnosis not present

## 2019-06-23 DIAGNOSIS — H43813 Vitreous degeneration, bilateral: Secondary | ICD-10-CM

## 2019-07-19 ENCOUNTER — Other Ambulatory Visit: Payer: Self-pay

## 2019-07-20 ENCOUNTER — Ambulatory Visit (INDEPENDENT_AMBULATORY_CARE_PROVIDER_SITE_OTHER): Payer: PPO | Admitting: Family Medicine

## 2019-07-20 ENCOUNTER — Other Ambulatory Visit: Payer: Self-pay

## 2019-07-20 ENCOUNTER — Encounter: Payer: Self-pay | Admitting: Family Medicine

## 2019-07-20 VITALS — BP 137/89 | HR 73 | Temp 97.5°F | Ht 63.0 in | Wt 165.4 lb

## 2019-07-20 DIAGNOSIS — K219 Gastro-esophageal reflux disease without esophagitis: Secondary | ICD-10-CM | POA: Diagnosis not present

## 2019-07-20 DIAGNOSIS — N401 Enlarged prostate with lower urinary tract symptoms: Secondary | ICD-10-CM | POA: Diagnosis not present

## 2019-07-20 DIAGNOSIS — I10 Essential (primary) hypertension: Secondary | ICD-10-CM | POA: Diagnosis not present

## 2019-07-20 DIAGNOSIS — E119 Type 2 diabetes mellitus without complications: Secondary | ICD-10-CM

## 2019-07-20 LAB — URINALYSIS
Bilirubin, UA: NEGATIVE
Glucose, UA: NEGATIVE
Ketones, UA: NEGATIVE
Nitrite, UA: NEGATIVE
Protein,UA: NEGATIVE
Specific Gravity, UA: 1.02 (ref 1.005–1.030)
Urobilinogen, Ur: 0.2 mg/dL (ref 0.2–1.0)
pH, UA: 7 (ref 5.0–7.5)

## 2019-07-20 LAB — BAYER DCA HB A1C WAIVED: HB A1C (BAYER DCA - WAIVED): 6.6 % (ref <7.0)

## 2019-07-20 MED ORDER — PANTOPRAZOLE SODIUM 40 MG PO TBEC: 40.0000 mg | DELAYED_RELEASE_TABLET | Freq: Two times a day (BID) | ORAL | 1 refills | Status: DC

## 2019-07-20 MED ORDER — FINASTERIDE 5 MG PO TABS: 5.0000 mg | ORAL_TABLET | Freq: Every day | ORAL | 1 refills | Status: DC

## 2019-07-20 MED ORDER — SIMVASTATIN 40 MG PO TABS: 20.0000 mg | ORAL_TABLET | Freq: Every day | ORAL | 1 refills | Status: DC

## 2019-07-20 MED ORDER — METFORMIN HCL 1000 MG PO TABS: ORAL_TABLET | ORAL | 1 refills | Status: DC

## 2019-07-20 NOTE — Progress Notes (Signed)
Subjective:  Patient ID: Jared Norman,  male    DOB: 05/31/1940  Age: 79 y.o.    CC: Follow-up (3 month)   HPI Jared Norman presents for  follow-up of hypertension. Patient has no history of headache chest pain or shortness of breath or recent cough. Patient also denies symptoms of TIA such as numbness weakness lateralizing. Patient denies side effects from medication. States taking it regularly.  Patient also  in for follow-up of elevated cholesterol. Doing well without complaints on current medication. Denies side effects  including myalgia and arthralgia and nausea. Also in today for liver function testing. Currently no chest pain, shortness of breath or other cardiovascular related symptoms noted.  Follow-up of diabetes. Patient does check blood sugar at home. Readings run between 90 and 120.  Today it was 117 and that is about what it normally runs.  Not checking postprandial.  Has recommitted to exercise and diet since his last treatment.  Is planning to see his eye doctor tomorrow.  He says his eye doctor tells him there is some damage from the past when he had uncontrolled diabetes. Patient denies symptoms such as excessive hunger or urinary frequency, excessive hunger, nausea No significant hypoglycemic spells noted. Medications reviewed. Pt reports taking them regularly. Pt. denies complication/adverse reaction today.    History Jared Norman has a past medical history of Arthritis, BPH (benign prostatic hyperplasia), Cataract, Gallstones, Hyperlipidemia, Hypertension, Kidney stones, Type 2 diabetes mellitus ([place]), and Vitamin D deficiency.   He has a past surgical history that includes Cystoscopy with retrograde pyelogram, ureteroscopy and stent placement (Left, 12/20/2013); Foot surgery (Right, 2008); Laparoscopic cholecystectomy (05/2018); and Cataract extraction, bilateral.   His family history includes ALS in his brother; Bladder Cancer in his father; Blindness in his father and  paternal grandfather; Diabetes in his brother; Hypertension in his brother.He reports that he has never smoked. He has never used smokeless tobacco. He reports that he does not drink alcohol or use drugs.  Current Outpatient Medications on File Prior to Visit  Medication Sig Dispense Refill  . aspirin EC 81 MG tablet Take 81 mg by mouth every Monday, Wednesday, and Friday.    . cholecalciferol (VITAMIN D) 1000 UNITS tablet Take 2,000 Units by mouth daily.    Marland Kitchen EASY TOUCH LANCETS 26G MISC Use to check BS daily DX E11.9 100 each 3  . glucose blood test strip Test glucose up to 4 times daily 100 each 12  . losartan (COZAAR) 25 MG tablet Take 1 tablet (25 mg total) by mouth daily. 90 tablet 1  . magnesium gluconate (MAGONATE) 500 MG tablet Take 1,000 mg by mouth daily.     . meloxicam (MOBIC) 15 MG tablet Take 1 tablet (15 mg total) by mouth daily. 90 tablet 1  . trimethoprim-polymyxin b (POLYTRIM) ophthalmic solution INSTILL 1 DROP 4 TIMES DAILY INTO EACH EYE FOR 2 DAYS AFTER EACH MONTHLY EYE INJECTION     No current facility-administered medications on file prior to visit.    ROS Review of Systems  Constitutional: Negative.   HENT: Negative.   Eyes: Negative for visual disturbance.  Respiratory: Negative for cough and shortness of breath.   Cardiovascular: Negative for chest pain and leg swelling.  Gastrointestinal: Negative for abdominal pain (Several months since last had heartburn), diarrhea, nausea and vomiting.  Genitourinary: Negative for difficulty urinating.  Musculoskeletal: Negative for arthralgias and myalgias.  Skin: Negative for rash.  Neurological: Negative for headaches.  Psychiatric/Behavioral: Negative for sleep disturbance.  Objective:  BP 137/89   Pulse 73   Temp (!) 97.5 F (36.4 C) (Temporal)   Ht _0  (1.6 m)   Wt 165 lb 6.4 oz (75 kg)   BMI 29.30 kg/m   BP Readings from Last 3 Encounters:  07/20/19 137/89  02/16/19 107/66  02/02/19 135/73    Wt  Readings from Last 3 Encounters:  07/20/19 165 lb 6.4 oz (75 kg)  02/16/19 167 lb (75.8 kg)  02/02/19 173 lb (78.5 kg)     Physical Exam Vitals reviewed.  Constitutional:      Appearance: He is well-developed.  HENT:     Head: Normocephalic and atraumatic.     Right Ear: External ear normal.     Left Ear: External ear normal.     Mouth/Throat:     Pharynx: No oropharyngeal exudate or posterior oropharyngeal erythema.  Eyes:     Pupils: Pupils are equal, round, and reactive to light.  Cardiovascular:     Rate and Rhythm: Normal rate and regular rhythm.     Heart sounds: No murmur.  Pulmonary:     Effort: No respiratory distress.     Breath sounds: Normal breath sounds.  Musculoskeletal:     Cervical back: Normal range of motion and neck supple.  Neurological:     Mental Status: He is alert and oriented to person, place, and time.     Diabetic Foot Exam - Simple   No data filed        Assessment & Plan:   Jared Norman was seen today for follow-up.  Diagnoses and all orders for this visit:  Diabetes mellitus without complication ([place]) -     CMP14+EGFR -     CBC with Differential/Platelet -     Bayer DCA Hb A1c Waived -     Microalbumin / creatinine urine ratio -     metFORMIN (GLUCOPHAGE) 1000 MG tablet; TAKE ONE TABLET BY MOUTH ONCE DAILY WITH BREAKFAST -     simvastatin (ZOCOR) 40 MG tablet; Take 0.5 tablets (20 mg total) by mouth at bedtime. -     Urinalysis  Essential hypertension -     CMP14+EGFR -     CBC with Differential/Platelet -     Bayer DCA Hb A1c Waived -     Microalbumin / creatinine urine ratio -     Urinalysis  Gastroesophageal reflux disease without esophagitis -     pantoprazole (PROTONIX) 40 MG tablet; Take 1 tablet (40 mg total) by mouth 2 (two) times daily.  Benign prostatic hyperplasia with lower urinary tract symptoms, symptom details unspecified -     finasteride (PROSCAR) 5 MG tablet; Take 1 tablet (5 mg total) by mouth daily.   I  am having Jared Norman maintain his magnesium gluconate, cholecalciferol, aspirin EC, Easy Touch Lancets 26G, glucose blood, losartan, meloxicam, trimethoprim-polymyxin b, finasteride, metFORMIN, pantoprazole, and simvastatin.  Meds ordered this encounter  Medications  . finasteride (PROSCAR) 5 MG tablet    Sig: Take 1 tablet (5 mg total) by mouth daily.    Dispense:  90 tablet    Refill:  1  . metFORMIN (GLUCOPHAGE) 1000 MG tablet    Sig: TAKE ONE TABLET BY MOUTH ONCE DAILY WITH BREAKFAST    Dispense:  90 tablet    Refill:  1  . pantoprazole (PROTONIX) 40 MG tablet    Sig: Take 1 tablet (40 mg total) by mouth 2 (two) times daily.    Dispense:  180 tablet  Refill:  1  . simvastatin (ZOCOR) 40 MG tablet    Sig: Take 0.5 tablets (20 mg total) by mouth at bedtime.    Dispense:  90 tablet    Refill:  1   Patient has improved significantly with regard to his A1c.  It is 6.6 this morning.  That is down significantly.  This result of his working hard on diet and exercise as well as taking his medicine.  He has been symptom-free from his reflux.  Although I renewed the Protonix for him and told him he could try to wean off the medicine.  He simply needs to discontinue it and monitor for any reflux.  If that occurs she should go back on it and consider the long-term medication.  He continues to tolerating the medicine well.  Is liver kidney functions and cholesterol are pending through lab.  Follow-up: Return in about 3 months (around 10/20/2019).  Claretta Fraise, M.D.

## 2019-07-21 ENCOUNTER — Encounter (INDEPENDENT_AMBULATORY_CARE_PROVIDER_SITE_OTHER): Payer: PPO | Admitting: Ophthalmology

## 2019-07-21 DIAGNOSIS — H43813 Vitreous degeneration, bilateral: Secondary | ICD-10-CM

## 2019-07-21 DIAGNOSIS — E11311 Type 2 diabetes mellitus with unspecified diabetic retinopathy with macular edema: Secondary | ICD-10-CM

## 2019-07-21 DIAGNOSIS — I10 Essential (primary) hypertension: Secondary | ICD-10-CM

## 2019-07-21 DIAGNOSIS — H35033 Hypertensive retinopathy, bilateral: Secondary | ICD-10-CM

## 2019-07-21 DIAGNOSIS — E113513 Type 2 diabetes mellitus with proliferative diabetic retinopathy with macular edema, bilateral: Secondary | ICD-10-CM | POA: Diagnosis not present

## 2019-07-21 LAB — CMP14+EGFR
ALT: 24 IU/L (ref 0–44)
AST: 20 IU/L (ref 0–40)
Albumin/Globulin Ratio: 1.9 (ref 1.2–2.2)
Albumin: 4.3 g/dL (ref 3.7–4.7)
Alkaline Phosphatase: 62 IU/L (ref 39–117)
BUN/Creatinine Ratio: 19 (ref 10–24)
BUN: 25 mg/dL (ref 8–27)
Bilirubin Total: 1.5 mg/dL — ABNORMAL HIGH (ref 0.0–1.2)
CO2: 24 mmol/L (ref 20–29)
Calcium: 10 mg/dL (ref 8.6–10.2)
Chloride: 100 mmol/L (ref 96–106)
Creatinine, Ser: 1.3 mg/dL — ABNORMAL HIGH (ref 0.76–1.27)
GFR calc Af Amer: 60 mL/min/{1.73_m2} (ref >59)
GFR calc non Af Amer: 52 mL/min/{1.73_m2} — ABNORMAL LOW (ref >59)
Globulin, Total: 2.3 g/dL (ref 1.5–4.5)
Glucose: 115 mg/dL — ABNORMAL HIGH (ref 65–99)
Potassium: 5 mmol/L (ref 3.5–5.2)
Sodium: 139 mmol/L (ref 134–144)
Total Protein: 6.6 g/dL (ref 6.0–8.5)

## 2019-07-21 LAB — CBC WITH DIFFERENTIAL/PLATELET
Basophils Absolute: 0.1 10*3/uL (ref 0.0–0.2)
Basos: 1 %
EOS (ABSOLUTE): 0.1 10*3/uL (ref 0.0–0.4)
Eos: 2 %
Hematocrit: 44.1 % (ref 37.5–51.0)
Hemoglobin: 14.6 g/dL (ref 13.0–17.7)
Immature Grans (Abs): 0 10*3/uL (ref 0.0–0.1)
Immature Granulocytes: 0 %
Lymphocytes Absolute: 2.3 10*3/uL (ref 0.7–3.1)
Lymphs: 34 %
MCH: 28.9 pg (ref 26.6–33.0)
MCHC: 33.1 g/dL (ref 31.5–35.7)
MCV: 87 fL (ref 79–97)
Monocytes Absolute: 0.8 10*3/uL (ref 0.1–0.9)
Monocytes: 12 %
Neutrophils Absolute: 3.4 10*3/uL (ref 1.4–7.0)
Neutrophils: 51 %
Platelets: 234 10*3/uL (ref 150–450)
RBC: 5.05 x10E6/uL (ref 4.14–5.80)
RDW: 14.2 % (ref 11.6–15.4)
WBC: 6.6 10*3/uL (ref 3.4–10.8)

## 2019-07-21 NOTE — Progress Notes (Signed)
Hello Jex,  Your lab result is normal and/or stable.Some minor variations that are not significant are commonly marked abnormal, but do not represent any medical problem for you.  Best regards, Allia Wiltsey, M.D.

## 2019-07-22 LAB — MICROALBUMIN / CREATININE URINE RATIO
Creatinine, Urine: 71 mg/dL
Microalb/Creat Ratio: 18 mg/g creat (ref 0–29)
Microalbumin, Urine: 13 ug/mL

## 2019-08-20 ENCOUNTER — Encounter (INDEPENDENT_AMBULATORY_CARE_PROVIDER_SITE_OTHER): Payer: PPO | Admitting: Ophthalmology

## 2019-08-20 DIAGNOSIS — I10 Essential (primary) hypertension: Secondary | ICD-10-CM | POA: Diagnosis not present

## 2019-08-20 DIAGNOSIS — E113513 Type 2 diabetes mellitus with proliferative diabetic retinopathy with macular edema, bilateral: Secondary | ICD-10-CM

## 2019-08-20 DIAGNOSIS — H43813 Vitreous degeneration, bilateral: Secondary | ICD-10-CM | POA: Diagnosis not present

## 2019-08-20 DIAGNOSIS — E11311 Type 2 diabetes mellitus with unspecified diabetic retinopathy with macular edema: Secondary | ICD-10-CM

## 2019-08-20 DIAGNOSIS — H35033 Hypertensive retinopathy, bilateral: Secondary | ICD-10-CM

## 2019-08-24 DIAGNOSIS — L821 Other seborrheic keratosis: Secondary | ICD-10-CM | POA: Diagnosis not present

## 2019-08-24 DIAGNOSIS — L814 Other melanin hyperpigmentation: Secondary | ICD-10-CM | POA: Diagnosis not present

## 2019-08-24 DIAGNOSIS — L570 Actinic keratosis: Secondary | ICD-10-CM | POA: Diagnosis not present

## 2019-08-24 DIAGNOSIS — Z85828 Personal history of other malignant neoplasm of skin: Secondary | ICD-10-CM | POA: Diagnosis not present

## 2019-09-11 DIAGNOSIS — Z0131 Encounter for examination of blood pressure with abnormal findings: Secondary | ICD-10-CM | POA: Diagnosis not present

## 2019-09-11 DIAGNOSIS — Z20822 Contact with and (suspected) exposure to covid-19: Secondary | ICD-10-CM | POA: Diagnosis not present

## 2019-09-13 ENCOUNTER — Telehealth: Payer: Self-pay | Admitting: Family Medicine

## 2019-09-13 NOTE — Telephone Encounter (Signed)
Patient was taken off of Amlodipine in September because of hypotension.  Patient was informed.

## 2019-09-20 ENCOUNTER — Encounter (INDEPENDENT_AMBULATORY_CARE_PROVIDER_SITE_OTHER): Payer: PPO | Admitting: Ophthalmology

## 2019-09-27 ENCOUNTER — Encounter (INDEPENDENT_AMBULATORY_CARE_PROVIDER_SITE_OTHER): Payer: PPO | Admitting: Ophthalmology

## 2019-09-27 ENCOUNTER — Other Ambulatory Visit: Payer: Self-pay

## 2019-09-27 DIAGNOSIS — I10 Essential (primary) hypertension: Secondary | ICD-10-CM

## 2019-09-27 DIAGNOSIS — H35033 Hypertensive retinopathy, bilateral: Secondary | ICD-10-CM | POA: Diagnosis not present

## 2019-09-27 DIAGNOSIS — H43813 Vitreous degeneration, bilateral: Secondary | ICD-10-CM

## 2019-09-27 DIAGNOSIS — E11311 Type 2 diabetes mellitus with unspecified diabetic retinopathy with macular edema: Secondary | ICD-10-CM

## 2019-09-27 DIAGNOSIS — E113513 Type 2 diabetes mellitus with proliferative diabetic retinopathy with macular edema, bilateral: Secondary | ICD-10-CM

## 2019-10-02 ENCOUNTER — Other Ambulatory Visit: Payer: Self-pay | Admitting: Family Medicine

## 2019-10-15 ENCOUNTER — Other Ambulatory Visit: Payer: Self-pay | Admitting: Family Medicine

## 2019-10-15 DIAGNOSIS — M7731 Calcaneal spur, right foot: Secondary | ICD-10-CM

## 2019-10-25 ENCOUNTER — Other Ambulatory Visit: Payer: Self-pay

## 2019-10-25 ENCOUNTER — Encounter (INDEPENDENT_AMBULATORY_CARE_PROVIDER_SITE_OTHER): Payer: PPO | Admitting: Ophthalmology

## 2019-10-25 DIAGNOSIS — H43813 Vitreous degeneration, bilateral: Secondary | ICD-10-CM | POA: Diagnosis not present

## 2019-10-25 DIAGNOSIS — I10 Essential (primary) hypertension: Secondary | ICD-10-CM

## 2019-10-25 DIAGNOSIS — H35033 Hypertensive retinopathy, bilateral: Secondary | ICD-10-CM | POA: Diagnosis not present

## 2019-10-25 DIAGNOSIS — E11311 Type 2 diabetes mellitus with unspecified diabetic retinopathy with macular edema: Secondary | ICD-10-CM

## 2019-10-25 DIAGNOSIS — E113513 Type 2 diabetes mellitus with proliferative diabetic retinopathy with macular edema, bilateral: Secondary | ICD-10-CM

## 2019-10-26 ENCOUNTER — Ambulatory Visit (INDEPENDENT_AMBULATORY_CARE_PROVIDER_SITE_OTHER): Payer: PPO | Admitting: Family Medicine

## 2019-10-26 ENCOUNTER — Encounter: Payer: Self-pay | Admitting: Family Medicine

## 2019-10-26 ENCOUNTER — Other Ambulatory Visit: Payer: Self-pay

## 2019-10-26 VITALS — BP 177/84 | HR 60 | Temp 97.4°F | Ht 63.0 in | Wt 174.0 lb

## 2019-10-26 DIAGNOSIS — E119 Type 2 diabetes mellitus without complications: Secondary | ICD-10-CM | POA: Diagnosis not present

## 2019-10-26 DIAGNOSIS — I10 Essential (primary) hypertension: Secondary | ICD-10-CM

## 2019-10-26 DIAGNOSIS — E559 Vitamin D deficiency, unspecified: Secondary | ICD-10-CM | POA: Diagnosis not present

## 2019-10-26 DIAGNOSIS — E782 Mixed hyperlipidemia: Secondary | ICD-10-CM | POA: Diagnosis not present

## 2019-10-26 DIAGNOSIS — Z1159 Encounter for screening for other viral diseases: Secondary | ICD-10-CM | POA: Diagnosis not present

## 2019-10-26 DIAGNOSIS — K219 Gastro-esophageal reflux disease without esophagitis: Secondary | ICD-10-CM | POA: Diagnosis not present

## 2019-10-26 LAB — BAYER DCA HB A1C WAIVED: HB A1C (BAYER DCA - WAIVED): 6.5 % (ref <7.0)

## 2019-10-26 NOTE — Progress Notes (Signed)
Subjective:  Patient ID: Jared Norman, male    DOB: 01/17/1941  Age: 79 y.o. MRN: [id number]  CC: Follow-up (3 month)   HPI Jared Norman presents forFollow-up of diabetes. Patient checks blood sugar at home.   90-134 fasting. Jared Norman denies symptoms such as polyuria, polydipsia, excessive hunger, nausea No significant hypoglycemic spells noted. Medications reviewed. Pt reports taking them regularly without complication/adverse reaction being reported today.     Jared Norman has a past medical Jared of Arthritis, BPH (benign prostatic hyperplasia), Cataract, Gallstones, Hyperlipidemia, Hypertension, Kidney stones, Type 2 diabetes mellitus ([place]), and Vitamin D deficiency.   Jared Norman has a past surgical Jared that includes Cystoscopy with retrograde pyelogram, ureteroscopy and stent placement (Left, 12/20/2013); Foot surgery (Right, 2008); Laparoscopic cholecystectomy (05/2018); and Cataract extraction, bilateral.   His family Jared includes ALS in his brother; Bladder Cancer in his father; Blindness in his father and paternal grandfather; Diabetes in his brother; Hypertension in his brother.Jared Norman reports that Jared Norman has never smoked. Jared Norman has never used smokeless tobacco. Jared Norman reports that Jared Norman does not drink alcohol and does not use drugs.  Current Outpatient Medications on File Prior to Visit  Medication Sig Dispense Refill  . aspirin EC 81 MG tablet Take 81 mg by mouth every Monday, Wednesday, and Friday.    . cholecalciferol (VITAMIN D) 1000 UNITS tablet Take 2,000 Units by mouth daily.    Marland Kitchen EASY TOUCH LANCETS 26G MISC Use to check BS daily DX E11.9 100 each 3  . finasteride (PROSCAR) 5 MG tablet Take 1 tablet (5 mg total) by mouth daily. 90 tablet 1  . glucose blood test strip Test glucose up to 4 times daily 100 each 12  . losartan (COZAAR) 25 MG tablet Take 1 tablet by mouth once daily 90 tablet 0  . magnesium gluconate (MAGONATE) 500 MG tablet Take 1,000 mg by mouth daily.     .  meloxicam (MOBIC) 15 MG tablet Take 1 tablet by mouth once daily 90 tablet 0  . metFORMIN (GLUCOPHAGE) 1000 MG tablet TAKE ONE TABLET BY MOUTH ONCE DAILY WITH BREAKFAST 90 tablet 1  . pantoprazole (PROTONIX) 40 MG tablet Take 1 tablet (40 mg total) by mouth 2 (two) times daily. 180 tablet 1  . simvastatin (ZOCOR) 40 MG tablet Take 0.5 tablets (20 mg total) by mouth at bedtime. 90 tablet 1   No current facility-administered medications on file prior to visit.    ROS Review of Systems  Constitutional: Negative.   HENT: Negative.   Eyes: Negative for visual disturbance.  Respiratory: Negative for cough and shortness of breath.   Cardiovascular: Negative for chest pain and leg swelling.  Gastrointestinal: Negative for abdominal pain, diarrhea, nausea and vomiting.  Genitourinary: Negative for difficulty urinating.  Musculoskeletal: Negative for arthralgias and myalgias.  Skin: Negative for rash.  Neurological: Negative for headaches.  Psychiatric/Behavioral: Negative for sleep disturbance.    Objective:  BP (!) 177/84   Pulse 60   Temp (!) 97.4 F (36.3 C) (Temporal)   Ht '5\' 3"'  (1.6 m)   Wt 174 lb (78.9 kg)   BMI 30.82 kg/m   BP Readings from Last 3 Encounters:  10/26/19 (!) 177/84  07/20/19 137/89  02/16/19 107/66    Wt Readings from Last 3 Encounters:  10/26/19 174 lb (78.9 kg)  07/20/19 165 lb 6.4 oz (75 kg)  02/16/19 167 lb (75.8 kg)     Physical Exam Vitals reviewed.  Constitutional:      Appearance: Jared Norman  is well-developed.  HENT:     Head: Normocephalic and atraumatic.     Right Ear: External ear normal.     Left Ear: External ear normal.     Mouth/Throat:     Pharynx: No oropharyngeal exudate or posterior oropharyngeal erythema.  Eyes:     Pupils: Pupils are equal, round, and reactive to light.  Cardiovascular:     Rate and Rhythm: Normal rate and regular rhythm.     Heart sounds: No murmur heard.   Pulmonary:     Effort: No respiratory distress.      Breath sounds: Normal breath sounds.  Musculoskeletal:     Cervical back: Normal range of motion and neck supple.  Neurological:     Mental Status: Jared Norman is alert and oriented to person, place, and time.    Diabetic Foot Exam - Simple   Simple Foot Form Diabetic Foot exam was performed with the following findings: Yes 10/26/2019  8:11 AM  Visual Inspection No deformities, no ulcerations, no other skin breakdown bilaterally: Yes Sensation Testing Intact to touch and monofilament testing bilaterally: Yes Pulse Check Posterior Tibialis and Dorsalis pulse intact bilaterally: Yes Comments       Assessment & Plan:   Jared Norman was seen today for follow-up.  Diagnoses and all orders for this visit:  Diabetes mellitus without complication ([place]) -     Bayer DCA Hb A1c Waived -     CMP14+EGFR -     CBC with Differential/Platelet -     Lipid panel  Essential hypertension -     CMP14+EGFR -     CBC with Differential/Platelet  Gastroesophageal reflux disease without esophagitis -     CMP14+EGFR -     CBC with Differential/Platelet  Mixed hyperlipidemia -     CMP14+EGFR -     CBC with Differential/Platelet -     Lipid panel  Vitamin D deficiency -     CMP14+EGFR -     CBC with Differential/Platelet -     VITAMIN D 25 Hydroxy (Vit-D Deficiency, Fractures)  Need for hepatitis C screening test -     Hepatitis C antibody      I have discontinued Jared Norman's trimethoprim-polymyxin b. I am also having him maintain his magnesium gluconate, cholecalciferol, aspirin EC, Easy Touch Lancets 26G, glucose blood, finasteride, metFORMIN, pantoprazole, simvastatin, losartan, and meloxicam.  No orders of the defined types were placed in this encounter.    Follow-up: Return in about 3 months (around 01/26/2020).  Claretta Fraise, M.D.

## 2019-10-27 LAB — CMP14+EGFR
ALT: 22 IU/L (ref 0–44)
AST: 23 IU/L (ref 0–40)
Albumin/Globulin Ratio: 1.8 (ref 1.2–2.2)
Albumin: 4.2 g/dL (ref 3.7–4.7)
Alkaline Phosphatase: 64 IU/L (ref 48–121)
BUN/Creatinine Ratio: 17 (ref 10–24)
BUN: 22 mg/dL (ref 8–27)
Bilirubin Total: 0.7 mg/dL (ref 0.0–1.2)
CO2: 23 mmol/L (ref 20–29)
Calcium: 9.2 mg/dL (ref 8.6–10.2)
Chloride: 102 mmol/L (ref 96–106)
Creatinine, Ser: 1.32 mg/dL — ABNORMAL HIGH (ref 0.76–1.27)
GFR calc Af Amer: 59 mL/min/{1.73_m2} — ABNORMAL LOW (ref >59)
GFR calc non Af Amer: 51 mL/min/{1.73_m2} — ABNORMAL LOW (ref >59)
Globulin, Total: 2.4 g/dL (ref 1.5–4.5)
Glucose: 120 mg/dL — ABNORMAL HIGH (ref 65–99)
Potassium: 5.5 mmol/L — ABNORMAL HIGH (ref 3.5–5.2)
Sodium: 139 mmol/L (ref 134–144)
Total Protein: 6.6 g/dL (ref 6.0–8.5)

## 2019-10-27 LAB — CBC WITH DIFFERENTIAL/PLATELET
Basophils Absolute: 0.1 10*3/uL (ref 0.0–0.2)
Basos: 1 %
EOS (ABSOLUTE): 0.1 10*3/uL (ref 0.0–0.4)
Eos: 2 %
Hematocrit: 41.1 % (ref 37.5–51.0)
Hemoglobin: 14 g/dL (ref 13.0–17.7)
Immature Grans (Abs): 0 10*3/uL (ref 0.0–0.1)
Immature Granulocytes: 0 %
Lymphocytes Absolute: 2.4 10*3/uL (ref 0.7–3.1)
Lymphs: 32 %
MCH: 29.2 pg (ref 26.6–33.0)
MCHC: 34.1 g/dL (ref 31.5–35.7)
MCV: 86 fL (ref 79–97)
Monocytes Absolute: 0.7 10*3/uL (ref 0.1–0.9)
Monocytes: 10 %
Neutrophils Absolute: 4.2 10*3/uL (ref 1.4–7.0)
Neutrophils: 55 %
Platelets: 227 10*3/uL (ref 150–450)
RBC: 4.79 x10E6/uL (ref 4.14–5.80)
RDW: 13.9 % (ref 11.6–15.4)
WBC: 7.5 10*3/uL (ref 3.4–10.8)

## 2019-10-27 LAB — LIPID PANEL
Chol/HDL Ratio: 3.1 ratio (ref 0.0–5.0)
Cholesterol, Total: 159 mg/dL (ref 100–199)
HDL: 51 mg/dL (ref >39)
LDL Chol Calc (NIH): 90 mg/dL (ref 0–99)
Triglycerides: 98 mg/dL (ref 0–149)
VLDL Cholesterol Cal: 18 mg/dL (ref 5–40)

## 2019-10-27 LAB — HEPATITIS C ANTIBODY: Hep C Virus Ab: <0.1 s/co ratio (ref 0.0–0.9)

## 2019-10-27 LAB — VITAMIN D 25 HYDROXY (VIT D DEFICIENCY, FRACTURES): Vit D, 25-Hydroxy: 35 ng/mL (ref 30.0–100.0)

## 2019-10-29 NOTE — Progress Notes (Signed)
Hello Amandeep,  Your lab result is normal and/or stable.Some minor variations that are not significant are commonly marked abnormal, but do not represent any medical problem for you.  Best regards, Jesaiah Fabiano, M.D.

## 2019-12-02 ENCOUNTER — Encounter (INDEPENDENT_AMBULATORY_CARE_PROVIDER_SITE_OTHER): Payer: PPO | Admitting: Ophthalmology

## 2019-12-02 ENCOUNTER — Other Ambulatory Visit: Payer: Self-pay

## 2019-12-02 DIAGNOSIS — I10 Essential (primary) hypertension: Secondary | ICD-10-CM | POA: Diagnosis not present

## 2019-12-02 DIAGNOSIS — E113513 Type 2 diabetes mellitus with proliferative diabetic retinopathy with macular edema, bilateral: Secondary | ICD-10-CM | POA: Diagnosis not present

## 2019-12-02 DIAGNOSIS — E11311 Type 2 diabetes mellitus with unspecified diabetic retinopathy with macular edema: Secondary | ICD-10-CM

## 2019-12-02 DIAGNOSIS — H35033 Hypertensive retinopathy, bilateral: Secondary | ICD-10-CM | POA: Diagnosis not present

## 2019-12-02 DIAGNOSIS — H43813 Vitreous degeneration, bilateral: Secondary | ICD-10-CM

## 2019-12-17 DIAGNOSIS — R05 Cough: Secondary | ICD-10-CM | POA: Diagnosis not present

## 2019-12-17 DIAGNOSIS — J209 Acute bronchitis, unspecified: Secondary | ICD-10-CM | POA: Diagnosis not present

## 2019-12-20 DIAGNOSIS — U071 COVID-19: Secondary | ICD-10-CM | POA: Diagnosis not present

## 2019-12-22 ENCOUNTER — Other Ambulatory Visit: Payer: Self-pay | Admitting: Family Medicine

## 2019-12-22 DIAGNOSIS — N401 Enlarged prostate with lower urinary tract symptoms: Secondary | ICD-10-CM

## 2020-01-08 ENCOUNTER — Other Ambulatory Visit: Payer: Self-pay | Admitting: Family Medicine

## 2020-01-11 ENCOUNTER — Other Ambulatory Visit: Payer: Self-pay

## 2020-01-11 ENCOUNTER — Encounter (INDEPENDENT_AMBULATORY_CARE_PROVIDER_SITE_OTHER): Payer: PPO | Admitting: Ophthalmology

## 2020-01-11 DIAGNOSIS — E113513 Type 2 diabetes mellitus with proliferative diabetic retinopathy with macular edema, bilateral: Secondary | ICD-10-CM

## 2020-01-11 DIAGNOSIS — H43813 Vitreous degeneration, bilateral: Secondary | ICD-10-CM

## 2020-01-11 DIAGNOSIS — I10 Essential (primary) hypertension: Secondary | ICD-10-CM | POA: Diagnosis not present

## 2020-01-11 DIAGNOSIS — H35033 Hypertensive retinopathy, bilateral: Secondary | ICD-10-CM

## 2020-01-11 DIAGNOSIS — E11311 Type 2 diabetes mellitus with unspecified diabetic retinopathy with macular edema: Secondary | ICD-10-CM | POA: Diagnosis not present

## 2020-01-11 LAB — HM DIABETES EYE EXAM

## 2020-01-21 ENCOUNTER — Other Ambulatory Visit: Payer: Self-pay | Admitting: Family Medicine

## 2020-01-21 DIAGNOSIS — M7731 Calcaneal spur, right foot: Secondary | ICD-10-CM

## 2020-01-26 ENCOUNTER — Encounter: Payer: Self-pay | Admitting: Family Medicine

## 2020-01-26 ENCOUNTER — Other Ambulatory Visit: Payer: Self-pay

## 2020-01-26 ENCOUNTER — Ambulatory Visit (INDEPENDENT_AMBULATORY_CARE_PROVIDER_SITE_OTHER): Payer: PPO | Admitting: Family Medicine

## 2020-01-26 VITALS — BP 165/85 | HR 74 | Temp 97.7°F | Resp 20 | Ht 63.0 in | Wt 178.0 lb

## 2020-01-26 DIAGNOSIS — E782 Mixed hyperlipidemia: Secondary | ICD-10-CM

## 2020-01-26 DIAGNOSIS — K219 Gastro-esophageal reflux disease without esophagitis: Secondary | ICD-10-CM | POA: Diagnosis not present

## 2020-01-26 DIAGNOSIS — I10 Essential (primary) hypertension: Secondary | ICD-10-CM | POA: Diagnosis not present

## 2020-01-26 DIAGNOSIS — E119 Type 2 diabetes mellitus without complications: Secondary | ICD-10-CM | POA: Diagnosis not present

## 2020-01-26 LAB — CBC WITH DIFFERENTIAL/PLATELET
Basophils Absolute: 0.1 10*3/uL (ref 0.0–0.2)
Basos: 1 %
EOS (ABSOLUTE): 0.2 10*3/uL (ref 0.0–0.4)
Eos: 2 %
Hematocrit: 42.6 % (ref 37.5–51.0)
Hemoglobin: 14.7 g/dL (ref 13.0–17.7)
Immature Grans (Abs): 0 10*3/uL (ref 0.0–0.1)
Immature Granulocytes: 0 %
Lymphocytes Absolute: 2 10*3/uL (ref 0.7–3.1)
Lymphs: 29 %
MCH: 29.7 pg (ref 26.6–33.0)
MCHC: 34.5 g/dL (ref 31.5–35.7)
MCV: 86 fL (ref 79–97)
Monocytes Absolute: 0.8 10*3/uL (ref 0.1–0.9)
Monocytes: 11 %
Neutrophils Absolute: 3.9 10*3/uL (ref 1.4–7.0)
Neutrophils: 57 %
Platelets: 230 10*3/uL (ref 150–450)
RBC: 4.95 x10E6/uL (ref 4.14–5.80)
RDW: 13.9 % (ref 11.6–15.4)
WBC: 6.9 10*3/uL (ref 3.4–10.8)

## 2020-01-26 LAB — CMP14+EGFR
ALT: 29 IU/L (ref 0–44)
AST: 24 IU/L (ref 0–40)
Albumin/Globulin Ratio: 1.8 (ref 1.2–2.2)
Albumin: 4.3 g/dL (ref 3.7–4.7)
Alkaline Phosphatase: 79 IU/L (ref 44–121)
BUN/Creatinine Ratio: 19 (ref 10–24)
BUN: 20 mg/dL (ref 8–27)
Bilirubin Total: 0.8 mg/dL (ref 0.0–1.2)
CO2: 23 mmol/L (ref 20–29)
Calcium: 9.5 mg/dL (ref 8.6–10.2)
Chloride: 101 mmol/L (ref 96–106)
Creatinine, Ser: 1.06 mg/dL (ref 0.76–1.27)
GFR calc Af Amer: 76 mL/min/{1.73_m2} (ref >59)
GFR calc non Af Amer: 66 mL/min/{1.73_m2} (ref >59)
Globulin, Total: 2.4 g/dL (ref 1.5–4.5)
Glucose: 133 mg/dL — ABNORMAL HIGH (ref 65–99)
Potassium: 4.7 mmol/L (ref 3.5–5.2)
Sodium: 140 mmol/L (ref 134–144)
Total Protein: 6.7 g/dL (ref 6.0–8.5)

## 2020-01-26 LAB — LIPID PANEL
Chol/HDL Ratio: 3.8 ratio (ref 0.0–5.0)
Cholesterol, Total: 170 mg/dL (ref 100–199)
HDL: 45 mg/dL (ref >39)
LDL Chol Calc (NIH): 93 mg/dL (ref 0–99)
Triglycerides: 186 mg/dL — ABNORMAL HIGH (ref 0–149)
VLDL Cholesterol Cal: 32 mg/dL (ref 5–40)

## 2020-01-26 LAB — BAYER DCA HB A1C WAIVED: HB A1C (BAYER DCA - WAIVED): 6.8 % (ref <7.0)

## 2020-01-26 MED ORDER — SIMVASTATIN 40 MG PO TABS: 20.0000 mg | ORAL_TABLET | Freq: Every day | ORAL | 1 refills | Status: DC

## 2020-01-26 MED ORDER — PANTOPRAZOLE SODIUM 40 MG PO TBEC: 40.0000 mg | DELAYED_RELEASE_TABLET | Freq: Two times a day (BID) | ORAL | 1 refills | Status: DC

## 2020-01-26 MED ORDER — LOSARTAN POTASSIUM 50 MG PO TABS
50.0000 mg | ORAL_TABLET | Freq: Every day | ORAL | 1 refills | Status: DC
Start: 2020-01-26 — End: 2020-02-16

## 2020-01-26 MED ORDER — METFORMIN HCL 1000 MG PO TABS: ORAL_TABLET | ORAL | 1 refills | Status: DC

## 2020-01-26 NOTE — Progress Notes (Signed)
Subjective:  Patient ID: Jared Norman,  male    DOB: 25-May-1940  Age: 79 y.o.    CC: Medical Management of Chronic Issues   HPI Jared Norman presents for  follow-up of hypertension. Patient has no history of headache chest pain or shortness of breath .  Patient denies side effects from medication. States taking it regularly. He brought in fairly extensive logs taking into July and August showing that his morning blood pressures frequently run above one 50-1 60 and a few that are in the 782N and 562Z systolic. There are no low numbers with the exception of 1 day in the middle of the recent Covid infection.  Patient relates that he had Covid in the early to mid part of August. He says his was very mild. He had been vaccinated by the [employer] vaccine several months before that. He said he had a cough and some joint aches. He had a first test done early on that was -4 days later it became positive for Covid. He never had any fever shortness of breath or loss of smell or taste. Has recovered completely.  Patient also  in for follow-up of elevated cholesterol. Doing well without complaints on current medication. Denies side effects  including myalgia and arthralgia and nausea. Also in today for liver function testing. Currently no chest pain, shortness of breath or other cardiovascular related symptoms noted.  Follow-up of diabetes. Patient does check blood sugar at home. Readings look good overall he says but he did not bring in a log for give me any numbers today. Patient denies symptoms such as excessive hunger or urinary frequency, excessive hunger, nausea No significant hypoglycemic spells noted. Medications reviewed. Pt reports taking them regularly. Pt. denies complication/adverse reaction today.    History Ronan has a past medical history of Arthritis, BPH (benign prostatic hyperplasia), Cataract, Gallstones, Hyperlipidemia, Hypertension, Kidney stones, Type 2 diabetes  mellitus ([place]), and Vitamin D deficiency.   He has a past surgical history that includes Cystoscopy with retrograde pyelogram, ureteroscopy and stent placement (Left, 12/20/2013); Foot surgery (Right, 2008); Laparoscopic cholecystectomy (05/2018); and Cataract extraction, bilateral.   His family history includes ALS in his brother; Bladder Cancer in his father; Blindness in his father and paternal grandfather; Diabetes in his brother; Hypertension in his brother.He reports that he has never smoked. He has never used smokeless tobacco. He reports that he does not drink alcohol and does not use drugs.  Current Outpatient Medications on File Prior to Visit  Medication Sig Dispense Refill  . aspirin EC 81 MG tablet Take 81 mg by mouth every Monday, Wednesday, and Friday.    . cholecalciferol (VITAMIN D) 1000 UNITS tablet Take 2,000 Units by mouth daily.    Marland Kitchen EASY TOUCH LANCETS 26G MISC Use to check BS daily DX E11.9 100 each 3  . finasteride (PROSCAR) 5 MG tablet Take 1 tablet by mouth once daily 90 tablet 1  . glucose blood test strip Test glucose up to 4 times daily 100 each 12  . magnesium gluconate (MAGONATE) 500 MG tablet Take 1,000 mg by mouth daily.     . meloxicam (MOBIC) 15 MG tablet Take 1 tablet by mouth once daily 90 tablet 0   No current facility-administered medications on file prior to visit.    ROS Review of Systems  Constitutional: Negative for fever.  Respiratory: Negative for shortness of breath.   Cardiovascular: Negative for chest pain.  Musculoskeletal: Negative for arthralgias.  Skin:  Negative for rash.    Objective:  BP (!) 165/85   Pulse 74   Temp 97.7 F (36.5 C) (Temporal)   Resp 20   Ht '5\' 3"'  (1.6 m)   Wt 178 lb (80.7 kg)   SpO2 98%   BMI 31.53 kg/m   BP Readings from Last 3 Encounters:  01/26/20 (!) 165/85  10/26/19 (!) 177/84  07/20/19 137/89    Wt Readings from Last 3 Encounters:  01/26/20 178 lb (80.7 kg)  10/26/19 174 lb (78.9 kg)  07/20/19  165 lb 6.4 oz (75 kg)     Physical Exam Vitals reviewed.  Constitutional:      Appearance: He is well-developed.  HENT:     Head: Normocephalic and atraumatic.     Right Ear: External ear normal.     Left Ear: External ear normal.     Mouth/Throat:     Pharynx: No oropharyngeal exudate or posterior oropharyngeal erythema.  Eyes:     Pupils: Pupils are equal, round, and reactive to light.  Cardiovascular:     Rate and Rhythm: Normal rate and regular rhythm.     Heart sounds: No murmur heard.   Pulmonary:     Effort: No respiratory distress.     Breath sounds: Normal breath sounds.  Musculoskeletal:     Cervical back: Normal range of motion and neck supple.  Neurological:     Mental Status: He is alert and oriented to person, place, and time.      Assessment & Plan:   Kayven was seen today for medical management of chronic issues.  Diagnoses and all orders for this visit:  Diabetes mellitus without complication ([place]) -     Bayer DCA Hb A1c Waived -     CBC with Differential/Platelet -     CMP14+EGFR -     Lipid panel -     metFORMIN (GLUCOPHAGE) 1000 MG tablet; TAKE ONE TABLET BY MOUTH ONCE DAILY WITH BREAKFAST -     simvastatin (ZOCOR) 40 MG tablet; Take 0.5 tablets (20 mg total) by mouth at bedtime.  Essential hypertension -     CBC with Differential/Platelet -     CMP14+EGFR -     Lipid panel  Mixed hyperlipidemia -     CBC with Differential/Platelet -     CMP14+EGFR -     Lipid panel  Gastroesophageal reflux disease without esophagitis -     pantoprazole (PROTONIX) 40 MG tablet; Take 1 tablet (40 mg total) by mouth 2 (two) times daily.  Other orders -     losartan (COZAAR) 50 MG tablet; Take 1 tablet (50 mg total) by mouth daily.   I have changed Lavonne L. Kirk's losartan. I am also having him maintain his magnesium gluconate, cholecalciferol, aspirin EC, Easy Touch Lancets 26G, glucose blood, finasteride, meloxicam, metFORMIN, pantoprazole, and  simvastatin.  Meds ordered this encounter  Medications  . losartan (COZAAR) 50 MG tablet    Sig: Take 1 tablet (50 mg total) by mouth daily.    Dispense:  90 tablet    Refill:  1  . metFORMIN (GLUCOPHAGE) 1000 MG tablet    Sig: TAKE ONE TABLET BY MOUTH ONCE DAILY WITH BREAKFAST    Dispense:  90 tablet    Refill:  1  . pantoprazole (PROTONIX) 40 MG tablet    Sig: Take 1 tablet (40 mg total) by mouth 2 (two) times daily.    Dispense:  180 tablet    Refill:  1  .  simvastatin (ZOCOR) 40 MG tablet    Sig: Take 0.5 tablets (20 mg total) by mouth at bedtime.    Dispense:  90 tablet    Refill:  1     Follow-up: Return in about 3 months (around 04/26/2020).  Claretta Fraise, M.D.

## 2020-01-27 NOTE — Progress Notes (Signed)
Hello Thimothy,  Your lab result is normal and/or stable.Some minor variations that are not significant are commonly marked abnormal, but do not represent any medical problem for you.  Best regards, Callie Bunyard, M.D.

## 2020-02-01 DIAGNOSIS — H6123 Impacted cerumen, bilateral: Secondary | ICD-10-CM | POA: Diagnosis not present

## 2020-02-11 ENCOUNTER — Ambulatory Visit (INDEPENDENT_AMBULATORY_CARE_PROVIDER_SITE_OTHER): Payer: PPO | Admitting: *Deleted

## 2020-02-11 ENCOUNTER — Other Ambulatory Visit: Payer: Self-pay

## 2020-02-11 ENCOUNTER — Encounter (INDEPENDENT_AMBULATORY_CARE_PROVIDER_SITE_OTHER): Payer: PPO | Admitting: Ophthalmology

## 2020-02-11 DIAGNOSIS — H43813 Vitreous degeneration, bilateral: Secondary | ICD-10-CM

## 2020-02-11 DIAGNOSIS — E113513 Type 2 diabetes mellitus with proliferative diabetic retinopathy with macular edema, bilateral: Secondary | ICD-10-CM | POA: Diagnosis not present

## 2020-02-11 DIAGNOSIS — H35033 Hypertensive retinopathy, bilateral: Secondary | ICD-10-CM

## 2020-02-11 DIAGNOSIS — I10 Essential (primary) hypertension: Secondary | ICD-10-CM | POA: Diagnosis not present

## 2020-02-11 DIAGNOSIS — E11311 Type 2 diabetes mellitus with unspecified diabetic retinopathy with macular edema: Secondary | ICD-10-CM | POA: Diagnosis not present

## 2020-02-11 DIAGNOSIS — Z0000 Encounter for general adult medical examination without abnormal findings: Secondary | ICD-10-CM | POA: Diagnosis not present

## 2020-02-11 NOTE — Progress Notes (Signed)
Marland KitchenMarland KitchenMarland Kitchen............Marland Kitchen     MEDICARE ANNUAL WELLNESS VISIT  02/11/2020  Telephone Visit Disclaimer This Medicare AWV was conducted by telephone due to national recommendations for restrictions regarding the COVID-19 Pandemic (e.g. social distancing).  I verified, using two identifiers, that I am speaking with Jared Norman or their authorized healthcare agent. I discussed the limitations, risks, security, and privacy concerns of performing an evaluation and management service by telephone and the potential availability of an in-person appointment in the future. The patient expressed understanding and agreed to proceed.  Location of Patient: home Location of Provider (nurse): office  Subjective:    Jared Norman is a 79 y.o. male patient of Stacks, Cletus Gash, MD who had a Medicare Annual Wellness Visit today via telephone. Jared Norman is Working part time and Retired and lives with their family. he has 6 children. he reports that he is socially active and does interact with friends/family regularly. he is moderately physically active and enjoys reading, exercising and church.  Patient Care Team: Claretta Fraise, MD as PCP - General (Family Medicine) Satira Sark, MD as PCP - Cardiology (Cardiology) Gala Romney Cristopher Estimable, MD as Consulting Physician (Gastroenterology) Hayden Pedro, MD as Consulting Physician (Ophthalmology)  Advanced Directives 02/11/2020 02/10/2019 01/22/2019 01/21/2018 07/24/2015 12/27/2013 12/20/2013  Does Patient Have a Medical Advance Directive? No No No No No No Patient does not have advance directive;Patient would not like information  Would patient like information on creating a medical advance directive? No - Patient declined No - Patient declined No - Patient declined Yes (MAU/Ambulatory/Procedural Areas - Information given) - No - patient declined information -  Pre-existing out of facility DNR order (yellow form or pink MOST form) - - - - - - No    Hospital Utilization Over the  Past 12 Months: # of hospitalizations or ER visits: 0 # of surgeries: 0  Review of Systems    Patient reports that his overall health is unchanged compared to last year.  History obtained from chart review and the patient  Patient Reported Readings (BP, Pulse, CBG, Weight, etc) CBG 126  Pain Assessment Pain : No/denies pain     Current Medications & Allergies (verified) Allergies as of 02/11/2020   No Known Allergies     Medication List       Accurate as of February 11, 2020  9:04 AM. If you have any questions, ask your nurse or doctor.        aspirin EC 81 MG tablet Take 81 mg by mouth every Monday, Wednesday, and Friday.   cholecalciferol 1000 units tablet Commonly known as: VITAMIN D Take 2,000 Units by mouth daily.   Easy Touch Lancets 26G Misc Use to check BS daily DX E11.9   finasteride 5 MG tablet Commonly known as: PROSCAR Take 1 tablet by mouth once daily   glucose blood test strip Test glucose up to 4 times daily   losartan 50 MG tablet Commonly known as: COZAAR Take 1 tablet (50 mg total) by mouth daily.   magnesium gluconate 500 MG tablet Commonly known as: MAGONATE Take 1,000 mg by mouth daily.   meloxicam 15 MG tablet Commonly known as: MOBIC Take 1 tablet by mouth once daily   metFORMIN 1000 MG tablet Commonly known as: GLUCOPHAGE TAKE ONE TABLET BY MOUTH ONCE DAILY WITH BREAKFAST   pantoprazole 40 MG tablet Commonly known as: PROTONIX Take 1 tablet (40 mg total) by mouth 2 (two) times daily.   simvastatin 40 MG tablet Commonly known as:  ZOCOR Take 0.5 tablets (20 mg total) by mouth at bedtime.       History (reviewed): Past Medical History:  Diagnosis Date  . Arthritis   . BPH (benign prostatic hyperplasia)   . Cataract   . Gallstones   . Hyperlipidemia   . Hypertension   . Kidney stones   . Type 2 diabetes mellitus ([place])   . Vitamin D deficiency    Past Surgical History:  Procedure Laterality Date  . CATARACT  EXTRACTION, BILATERAL    . CYSTOSCOPY WITH RETROGRADE PYELOGRAM, URETEROSCOPY AND STENT PLACEMENT Left 12/20/2013   Procedure: CYSTOSCOPY WITHLEFT RETROGRADE PYELOGRAM,  AND LEFT STENT PLACEMENT;  Surgeon: Malka So, MD;  Location: [facility];  Service: Urology;  Laterality: Left;  . FOOT SURGERY Right 2008   Tendon rupture  . LAPAROSCOPIC CHOLECYSTECTOMY  05/2018   Dr. Ladona Horns   Family History  Problem Relation Age of Onset  . ALS Brother        Agent Orange  . Hypertension Brother   . Diabetes Brother   . Bladder Cancer Father   . Blindness Father   . Blindness Paternal Grandfather    Social History   Socioeconomic History  . Marital status: Married    Spouse name: Marlowe Kays  . Number of children: 7  . Years of education: 12th grade  . Highest education level: High school graduate  Occupational History  . Occupation: Company secretary  . Occupation: [employer]  . Occupation: retired  Tobacco Use  . Smoking status: Never Smoker  . Smokeless tobacco: Never Used  Vaping Use  . Vaping Use: Never used  Substance and Sexual Activity  . Alcohol use: No  . Drug use: No  . Sexual activity: Not Currently  Other Topics Concern  . Not on file  Social History Narrative  . Not on file   Social Determinants of Health   Financial Resource Strain: Low Risk   . Difficulty of Paying Living Expenses: Not hard at all  Food Insecurity: No Food Insecurity  . Worried About Charity fundraiser in the Last Year: Never true  . Ran Out of Food in the Last Year: Never true  Transportation Needs: No Transportation Needs  . Lack of Transportation (Medical): No  . Lack of Transportation (Non-Medical): No  Physical Activity: Sufficiently Active  . Days of Exercise per Week: 7 days  . Minutes of Exercise per Session: 60 min  Stress: No Stress Concern Present  . Feeling of Stress : Not at all  Social Connections: Socially Integrated  . Frequency of Communication with Friends and Family: More than three  times a week  . Frequency of Social Gatherings with Friends and Family: More than three times a week  . Attends Religious Services: More than 4 times per year  . Active Member of Clubs or Organizations: Yes  . Attends Archivist Meetings: 1 to 4 times per year  . Marital Status: Married    Activities of Daily Living In your present state of health, do you have any difficulty performing the following activities: 02/11/2020  Hearing? Y  Comment wears hearing aids bilaterally  Vision? Y  Comment pt gets injections every 4 to 6 weeks in eyes for diabetic retinopathy  Difficulty concentrating or making decisions? N  Walking or climbing stairs? N  Dressing or bathing? N  Doing errands, shopping? N  Preparing Food and eating ? N  Using the Toilet? N  In the past six months, have you  accidently leaked urine? N  Do you have problems with loss of bowel control? N  Managing your Medications? N  Managing your Finances? N  Housekeeping or managing your Housekeeping? Y  Comment can't do yard work any longer  Some recent data might be hidden    Patient Education/ Literacy How often do you need to have someone help you when you read instructions, pamphlets, or other written materials from your doctor or pharmacy?: 1 - Never What is the last grade level you completed in school?: 12  Exercise Current Exercise Habits: Home exercise routine, Type of exercise: Other - see comments (stationary bike), Time (Minutes): 60, Frequency (Times/Week): 7, Weekly Exercise (Minutes/Week): 420, Intensity: Mild, Exercise limited by: None identified  Diet Patient reports consuming 3 meals a day and 0 snack(s) a day Patient reports that his primary diet is: Diabetic Patient reports that she does have regular access to food.   Depression Screen PHQ 2/9 Scores 02/11/2020 01/26/2020 10/26/2019 07/20/2019 02/16/2019 02/10/2019 02/02/2019  PHQ - 2 Score 0 0 0 0 0 0 0     Fall Risk Fall Risk  02/11/2020  01/26/2020 10/26/2019 07/20/2019 02/16/2019  Falls in the past year? 0 0 0 0 0  Comment - - - - -  Number falls in past yr: - - 0 0 -  Injury with Fall? - - 0 0 -  Risk Factor Category  - - - - -  Risk for fall due to : - - No Fall Risks No Fall Risks -  Risk for fall due to: Comment - - - - -  Follow up - Falls evaluation completed Falls evaluation completed Falls evaluation completed -  Comment - - - - -     Objective:  Jared Norman seemed alert and oriented and he participated appropriately during our telephone visit.  Blood Pressure Weight BMI  BP Readings from Last 3 Encounters:  01/26/20 (!) 165/85  10/26/19 (!) 177/84  07/20/19 137/89   Wt Readings from Last 3 Encounters:  01/26/20 178 lb (80.7 kg)  10/26/19 174 lb (78.9 kg)  07/20/19 165 lb 6.4 oz (75 kg)   BMI Readings from Last 1 Encounters:  01/26/20 31.53 kg/m    *Unable to obtain current vital signs, weight, and BMI due to telephone visit type  Hearing/Vision  . Ingvald did not seem to have difficulty with hearing/understanding during the telephone conversation . Reports that he has had a formal eye exam by an eye care professional within the past year . Reports that he has had a formal hearing evaluation within the past year *Unable to fully assess hearing and vision during telephone visit type  Cognitive Function: 6CIT Screen 02/11/2020 02/10/2019  What Year? 0 points 0 points  What month? 0 points 0 points  What time? 0 points 0 points  Count back from 20 0 points 0 points  Months in reverse 0 points 0 points  Repeat phrase 0 points 0 points  Total Score 0 0   (Normal:0-7, Significant for Dysfunction: >8)  Normal Cognitive Function Screening: Yes   Immunization & Health Maintenance Record Immunization History  Administered Date(s) Administered  . Fluad Quad(high Dose 65+) 02/16/2019  . Influenza, High Dose Seasonal PF 01/21/2018  . Influenza,inj,Quad PF,6+ Mos 02/26/2013, 04/19/2014, 02/20/2015,  02/15/2016, 02/07/2017  . Janssen (J&J) SARS-COV-2 Vaccination 08/12/2019  . Pneumococcal Conjugate-13 11/18/2014  . Pneumococcal Polysaccharide-23 05/13/2010  . Tdap 12/07/2013  . Zoster Recombinat (Shingrix) 01/21/2018, 05/09/2018    Health Maintenance  Topic Date Due  . INFLUENZA VACCINE  12/12/2019  . HEMOGLOBIN A1C  07/25/2020  . FOOT EXAM  10/25/2020  . OPHTHALMOLOGY EXAM  01/10/2021  . TETANUS/TDAP  12/08/2023  . COVID-19 Vaccine  Completed  . PNA vac Low Risk Adult  Completed       Assessment  This is a routine wellness examination for Jared Norman.  Health Maintenance: Due or Overdue Health Maintenance Due  Topic Date Due  . INFLUENZA VACCINE  12/12/2019    Jared Norman does not need a referral for Community Assistance: Care Management:   no Social Work:    no Prescription Assistance:  no Nutrition/Diabetes Education:  no   Plan:  Personalized Goals Goals Addressed            This Visit's Progress   . DIET - INCREASE WATER INTAKE   Not on track    Try to drink 6-8 glasses of water daily.    . Have 3 meals a day   On track    Eat 3 healthy meals daily that consist of lean proteins, fruits and vegetables Decrease carbohydrates     . Prevent falls        Personalized Health Maintenance & Screening Recommendations  Influenza vaccine  Lung Cancer Screening Recommended: no (Low Dose CT Chest recommended if Age 73-80 years, 30 pack-year currently smoking OR have quit w/in past 15 years) Hepatitis C Screening recommended: no HIV Screening recommended: no  Advanced Directives: Written information was not prepared per patient's request.  Referrals & Orders No orders of the defined types were placed in this encounter.   Follow-up Plan . Follow-up with Claretta Fraise, MD as planned for 3 mo. F/u on 04/27/20 . Pt needs some diabetic shoes, appt made for 02/16/20 to get these asap . Pt is due for flu shot . Up to date on all health  maintenance . Declines information on advanced directives . Wears hearing aids bilaterally . Gets eye injections routinely for edema and retinopathy . Independent will all ADL's . AVS printed and mailed to patient    I have personally reviewed and noted the following in the patient's chart:   . Medical and social history . Use of alcohol, tobacco or illicit drugs  . Current medications and supplements . Functional ability and status . Nutritional status . Physical activity . Advanced directives . List of other physicians . Hospitalizations, surgeries, and ER visits in previous 12 months . Vitals . Screenings to include cognitive, depression, and falls . Referrals and appointments  In addition, I have reviewed and discussed with Jared Norman certain preventive protocols, quality metrics, and best practice recommendations. A written personalized care plan for preventive services as well as general preventive health recommendations is available and can be mailed to the patient at his request.      Rana Snare, LPN  [id number]

## 2020-02-16 ENCOUNTER — Ambulatory Visit (INDEPENDENT_AMBULATORY_CARE_PROVIDER_SITE_OTHER): Payer: PPO | Admitting: Family Medicine

## 2020-02-16 ENCOUNTER — Encounter: Payer: Self-pay | Admitting: Family Medicine

## 2020-02-16 ENCOUNTER — Telehealth: Payer: Self-pay

## 2020-02-16 ENCOUNTER — Other Ambulatory Visit: Payer: Self-pay

## 2020-02-16 VITALS — BP 186/95 | HR 68 | Temp 97.8°F | Resp 20 | Ht 63.0 in | Wt 175.0 lb

## 2020-02-16 DIAGNOSIS — Z23 Encounter for immunization: Secondary | ICD-10-CM

## 2020-02-16 DIAGNOSIS — I10 Essential (primary) hypertension: Secondary | ICD-10-CM | POA: Diagnosis not present

## 2020-02-16 DIAGNOSIS — E119 Type 2 diabetes mellitus without complications: Secondary | ICD-10-CM | POA: Diagnosis not present

## 2020-02-16 MED ORDER — OLMESARTAN MEDOXOMIL 40 MG PO TABS: 40.0000 mg | ORAL_TABLET | Freq: Every day | ORAL | 1 refills | Status: DC

## 2020-02-16 NOTE — Telephone Encounter (Signed)
Patient saw Dr. Livia Snellen today 10/6 and wants to know if he should continue taking his Losarten with the new B/P medicine prescribed to him today.  Please call.

## 2020-02-16 NOTE — Telephone Encounter (Signed)
DC Losartan. It is replaced by the new med, benicar(olmesartan)

## 2020-02-16 NOTE — Progress Notes (Signed)
Subjective:  Patient ID: Jared Norman, male    DOB: 12/03/1940  Age: 79 y.o. MRN: [id number]  CC: Diabetic shoes and Hypertension   HPI Jared Norman presents for  follow-up of hypertension. Patient has no history of headache chest pain or shortness of breath or recent cough. Patient also denies symptoms of TIA such as focal numbness or weakness. Patient denies side effects from medication. States taking it regularly.  Pt. Also having foot pain. Needs diabetic shoes. Pain is at the MTP areas.    History Jared Norman has a past medical history of Arthritis, BPH (benign prostatic hyperplasia), Cataract, Gallstones, Hyperlipidemia, Hypertension, Kidney stones, Type 2 diabetes mellitus ([place]), and Vitamin D deficiency.   He has a past surgical history that includes Cystoscopy with retrograde pyelogram, ureteroscopy and stent placement (Left, 12/20/2013); Foot surgery (Right, 2008); Laparoscopic cholecystectomy (05/2018); and Cataract extraction, bilateral.   His family history includes ALS in his brother; Bladder Cancer in his father; Blindness in his father and paternal grandfather; Diabetes in his brother; Hypertension in his brother.He reports that he has never smoked. He has never used smokeless tobacco. He reports that he does not drink alcohol and does not use drugs.  Current Outpatient Medications on File Prior to Visit  Medication Sig Dispense Refill  . aspirin EC 81 MG tablet Take 81 mg by mouth every Monday, Wednesday, and Friday.    . cholecalciferol (VITAMIN D) 1000 UNITS tablet Take 2,000 Units by mouth daily.    Marland Kitchen EASY TOUCH LANCETS 26G MISC Use to check BS daily DX E11.9 100 each 3  . finasteride (PROSCAR) 5 MG tablet Take 1 tablet by mouth once daily 90 tablet 1  . glucose blood test strip Test glucose up to 4 times daily 100 each 12  . magnesium gluconate (MAGONATE) 500 MG tablet Take 1,000 mg by mouth daily.     . meloxicam (MOBIC) 15 MG tablet Take 1 tablet by mouth once daily  90 tablet 0  . metFORMIN (GLUCOPHAGE) 1000 MG tablet TAKE ONE TABLET BY MOUTH ONCE DAILY WITH BREAKFAST 90 tablet 1  . pantoprazole (PROTONIX) 40 MG tablet Take 1 tablet (40 mg total) by mouth 2 (two) times daily. 180 tablet 1  . simvastatin (ZOCOR) 40 MG tablet Take 0.5 tablets (20 mg total) by mouth at bedtime. 90 tablet 1   No current facility-administered medications on file prior to visit.    ROS Review of Systems  Constitutional: Negative for fever.  Respiratory: Negative for shortness of breath.   Cardiovascular: Negative for chest pain.  Musculoskeletal: Negative for arthralgias.  Skin: Negative for rash.    Objective:  BP (!) 186/95   Pulse 68   Temp 97.8 F (36.6 C) (Temporal)   Resp 20   Ht 5\' 3"  (1.6 m)   Wt 175 lb (79.4 kg)   SpO2 98%   BMI 31.00 kg/m   BP Readings from Last 3 Encounters:  02/16/20 (!) 186/95  01/26/20 (!) 165/85  10/26/19 (!) 177/84    Wt Readings from Last 3 Encounters:  02/16/20 175 lb (79.4 kg)  01/26/20 178 lb (80.7 kg)  10/26/19 174 lb (78.9 kg)     Physical Exam Vitals reviewed.  Constitutional:      Appearance: He is well-developed.  HENT:     Head: Normocephalic and atraumatic.     Right Ear: External ear normal.     Left Ear: External ear normal.     Mouth/Throat:     Pharynx:  No oropharyngeal exudate or posterior oropharyngeal erythema.  Eyes:     Pupils: Pupils are equal, round, and reactive to light.  Cardiovascular:     Rate and Rhythm: Normal rate and regular rhythm.     Heart sounds: No murmur heard.   Pulmonary:     Effort: No respiratory distress.     Breath sounds: Normal breath sounds.  Musculoskeletal:     Cervical back: Normal range of motion and neck supple.  Neurological:     Mental Status: He is alert and oriented to person, place, and time.      Diabetic Foot Form - Detailed   Diabetic Foot Exam - detailed Diabetic Foot exam was performed with the following findings: Yes 02/16/2020 10:48 AM    Can the patient see the bottom of their feet?: Yes Are the shoes appropriate in style and fit?: Yes Is there swelling or and abnormal foot shape?: No Is there a claw toe deformity?: Yes Is there elevated skin temparature?: Yes Is there foot or ankle muscle weakness?: Yes Normal Range of Motion: No Semmes-Weinstein Monofilament Test R Site 1-Great Toe: Pos L Site 1-Great Toe: Pos    Comments: The right foot is noted to have valgus deformity causing pt to walk on instep. The left has claw toe deformity with calus at the dorsal 1st MTP & IP     Assessment & Plan:   Jared Norman was seen today for diabetic shoes and hypertension.  Diagnoses and all orders for this visit:  Need for immunization against influenza -     Flu Vaccine QUAD High Dose(Fluad)  Diabetes mellitus without complication ([place]) -     For home use only DME Other see comment  Primary hypertension  Other orders -     olmesartan (BENICAR) 40 MG tablet; Take 1 tablet (40 mg total) by mouth daily. For blood pressure   Allergies as of 02/16/2020   No Known Allergies     Medication List       Accurate as of February 16, 2020 11:59 PM. If you have any questions, ask your nurse or doctor.        STOP taking these medications   losartan 50 MG tablet Commonly known as: COZAAR Stopped by: Claretta Fraise, MD     TAKE these medications   aspirin EC 81 MG tablet Take 81 mg by mouth every Monday, Wednesday, and Friday.   cholecalciferol 1000 units tablet Commonly known as: VITAMIN D Take 2,000 Units by mouth daily.   Easy Touch Lancets 26G Misc Use to check BS daily DX E11.9   finasteride 5 MG tablet Commonly known as: PROSCAR Take 1 tablet by mouth once daily   glucose blood test strip Test glucose up to 4 times daily   magnesium gluconate 500 MG tablet Commonly known as: MAGONATE Take 1,000 mg by mouth daily.   meloxicam 15 MG tablet Commonly known as: MOBIC Take 1 tablet by mouth once daily    metFORMIN 1000 MG tablet Commonly known as: GLUCOPHAGE TAKE ONE TABLET BY MOUTH ONCE DAILY WITH BREAKFAST   olmesartan 40 MG tablet Commonly known as: Benicar Take 1 tablet (40 mg total) by mouth daily. For blood pressure Started by: Claretta Fraise, MD   pantoprazole 40 MG tablet Commonly known as: PROTONIX Take 1 tablet (40 mg total) by mouth 2 (two) times daily.   simvastatin 40 MG tablet Commonly known as: ZOCOR Take 0.5 tablets (20 mg total) by mouth at bedtime.  Durable Medical Equipment  (From admission, onward)         Start     Ordered   02/16/20 0000  For home use only DME Other see comment       Comments: ZD:GLOVFIEP with foot deformity E11.9  Diabetic shoes  Question:  Length of Need  Answer:  Lifetime   02/16/20 1132          Meds ordered this encounter  Medications  . olmesartan (BENICAR) 40 MG tablet    Sig: Take 1 tablet (40 mg total) by mouth daily. For blood pressure    Dispense:  90 tablet    Refill:  1     Follow-up: Return in about 3 months (around 05/18/2020).  Claretta Fraise, M.D.

## 2020-02-16 NOTE — Telephone Encounter (Signed)
Patient aware and verbalized understanding. °

## 2020-02-20 ENCOUNTER — Encounter: Payer: Self-pay | Admitting: Family Medicine

## 2020-02-25 ENCOUNTER — Telehealth: Payer: Self-pay

## 2020-02-28 DIAGNOSIS — Z85828 Personal history of other malignant neoplasm of skin: Secondary | ICD-10-CM | POA: Diagnosis not present

## 2020-02-28 DIAGNOSIS — L570 Actinic keratosis: Secondary | ICD-10-CM | POA: Diagnosis not present

## 2020-02-28 DIAGNOSIS — L814 Other melanin hyperpigmentation: Secondary | ICD-10-CM | POA: Diagnosis not present

## 2020-03-17 ENCOUNTER — Other Ambulatory Visit: Payer: Self-pay

## 2020-03-17 ENCOUNTER — Encounter (INDEPENDENT_AMBULATORY_CARE_PROVIDER_SITE_OTHER): Payer: PPO | Admitting: Ophthalmology

## 2020-03-17 DIAGNOSIS — H35033 Hypertensive retinopathy, bilateral: Secondary | ICD-10-CM | POA: Diagnosis not present

## 2020-03-17 DIAGNOSIS — E11311 Type 2 diabetes mellitus with unspecified diabetic retinopathy with macular edema: Secondary | ICD-10-CM

## 2020-03-17 DIAGNOSIS — E113513 Type 2 diabetes mellitus with proliferative diabetic retinopathy with macular edema, bilateral: Secondary | ICD-10-CM | POA: Diagnosis not present

## 2020-03-17 DIAGNOSIS — H43813 Vitreous degeneration, bilateral: Secondary | ICD-10-CM | POA: Diagnosis not present

## 2020-03-17 DIAGNOSIS — I10 Essential (primary) hypertension: Secondary | ICD-10-CM | POA: Diagnosis not present

## 2020-04-14 ENCOUNTER — Other Ambulatory Visit: Payer: Self-pay | Admitting: Family Medicine

## 2020-04-16 ENCOUNTER — Other Ambulatory Visit: Payer: Self-pay | Admitting: Family Medicine

## 2020-04-16 DIAGNOSIS — E119 Type 2 diabetes mellitus without complications: Secondary | ICD-10-CM

## 2020-04-20 DIAGNOSIS — E119 Type 2 diabetes mellitus without complications: Secondary | ICD-10-CM | POA: Diagnosis not present

## 2020-04-21 ENCOUNTER — Other Ambulatory Visit: Payer: Self-pay

## 2020-04-21 ENCOUNTER — Encounter (INDEPENDENT_AMBULATORY_CARE_PROVIDER_SITE_OTHER): Payer: PPO | Admitting: Ophthalmology

## 2020-04-21 DIAGNOSIS — E113513 Type 2 diabetes mellitus with proliferative diabetic retinopathy with macular edema, bilateral: Secondary | ICD-10-CM

## 2020-04-21 DIAGNOSIS — I10 Essential (primary) hypertension: Secondary | ICD-10-CM | POA: Diagnosis not present

## 2020-04-21 DIAGNOSIS — H43813 Vitreous degeneration, bilateral: Secondary | ICD-10-CM | POA: Diagnosis not present

## 2020-04-21 DIAGNOSIS — E11311 Type 2 diabetes mellitus with unspecified diabetic retinopathy with macular edema: Secondary | ICD-10-CM

## 2020-04-21 DIAGNOSIS — H35033 Hypertensive retinopathy, bilateral: Secondary | ICD-10-CM | POA: Diagnosis not present

## 2020-04-22 ENCOUNTER — Other Ambulatory Visit: Payer: Self-pay | Admitting: Family Medicine

## 2020-04-22 DIAGNOSIS — M7731 Calcaneal spur, right foot: Secondary | ICD-10-CM

## 2020-04-27 ENCOUNTER — Other Ambulatory Visit: Payer: Self-pay

## 2020-04-27 ENCOUNTER — Encounter: Payer: Self-pay | Admitting: Family Medicine

## 2020-04-27 ENCOUNTER — Ambulatory Visit (INDEPENDENT_AMBULATORY_CARE_PROVIDER_SITE_OTHER): Payer: PPO | Admitting: Family Medicine

## 2020-04-27 VITALS — BP 180/84 | HR 64 | Temp 98.3°F | Ht 63.0 in | Wt 177.2 lb

## 2020-04-27 DIAGNOSIS — K219 Gastro-esophageal reflux disease without esophagitis: Secondary | ICD-10-CM | POA: Diagnosis not present

## 2020-04-27 DIAGNOSIS — E782 Mixed hyperlipidemia: Secondary | ICD-10-CM

## 2020-04-27 DIAGNOSIS — N401 Enlarged prostate with lower urinary tract symptoms: Secondary | ICD-10-CM | POA: Diagnosis not present

## 2020-04-27 DIAGNOSIS — E119 Type 2 diabetes mellitus without complications: Secondary | ICD-10-CM

## 2020-04-27 DIAGNOSIS — I10 Essential (primary) hypertension: Secondary | ICD-10-CM | POA: Diagnosis not present

## 2020-04-27 LAB — CMP14+EGFR
ALT: 22 IU/L (ref 0–44)
AST: 24 IU/L (ref 0–40)
Albumin/Globulin Ratio: 2.3 — ABNORMAL HIGH (ref 1.2–2.2)
Albumin: 4.3 g/dL (ref 3.7–4.7)
Alkaline Phosphatase: 86 IU/L (ref 44–121)
BUN/Creatinine Ratio: 20 (ref 10–24)
BUN: 28 mg/dL — ABNORMAL HIGH (ref 8–27)
Bilirubin Total: 0.9 mg/dL (ref 0.0–1.2)
CO2: 23 mmol/L (ref 20–29)
Calcium: 9.4 mg/dL (ref 8.6–10.2)
Chloride: 102 mmol/L (ref 96–106)
Creatinine, Ser: 1.41 mg/dL — ABNORMAL HIGH (ref 0.76–1.27)
GFR calc Af Amer: 54 mL/min/{1.73_m2} — ABNORMAL LOW (ref >59)
GFR calc non Af Amer: 47 mL/min/{1.73_m2} — ABNORMAL LOW (ref >59)
Globulin, Total: 1.9 g/dL (ref 1.5–4.5)
Glucose: 115 mg/dL — ABNORMAL HIGH (ref 65–99)
Potassium: 5 mmol/L (ref 3.5–5.2)
Sodium: 140 mmol/L (ref 134–144)
Total Protein: 6.2 g/dL (ref 6.0–8.5)

## 2020-04-27 LAB — CBC WITH DIFFERENTIAL/PLATELET
Basophils Absolute: 0.1 10*3/uL (ref 0.0–0.2)
Basos: 1 %
EOS (ABSOLUTE): 0.1 10*3/uL (ref 0.0–0.4)
Eos: 2 %
Hematocrit: 42.7 % (ref 37.5–51.0)
Hemoglobin: 14.4 g/dL (ref 13.0–17.7)
Immature Grans (Abs): 0 10*3/uL (ref 0.0–0.1)
Immature Granulocytes: 0 %
Lymphocytes Absolute: 2.3 10*3/uL (ref 0.7–3.1)
Lymphs: 34 %
MCH: 29.9 pg (ref 26.6–33.0)
MCHC: 33.7 g/dL (ref 31.5–35.7)
MCV: 89 fL (ref 79–97)
Monocytes Absolute: 0.8 10*3/uL (ref 0.1–0.9)
Monocytes: 11 %
Neutrophils Absolute: 3.5 10*3/uL (ref 1.4–7.0)
Neutrophils: 52 %
Platelets: 199 10*3/uL (ref 150–450)
RBC: 4.81 x10E6/uL (ref 4.14–5.80)
RDW: 13.7 % (ref 11.6–15.4)
WBC: 6.8 10*3/uL (ref 3.4–10.8)

## 2020-04-27 LAB — LIPID PANEL
Chol/HDL Ratio: 2.6 ratio (ref 0.0–5.0)
Cholesterol, Total: 132 mg/dL (ref 100–199)
HDL: 51 mg/dL (ref >39)
LDL Chol Calc (NIH): 63 mg/dL (ref 0–99)
Triglycerides: 95 mg/dL (ref 0–149)
VLDL Cholesterol Cal: 18 mg/dL (ref 5–40)

## 2020-04-27 LAB — BAYER DCA HB A1C WAIVED: HB A1C (BAYER DCA - WAIVED): 7.1 % — ABNORMAL HIGH (ref <7.0)

## 2020-04-27 MED ORDER — SIMVASTATIN 40 MG PO TABS: 20.0000 mg | ORAL_TABLET | Freq: Every day | ORAL | 1 refills | Status: DC

## 2020-04-27 MED ORDER — OLMESARTAN MEDOXOMIL 40 MG PO TABS
40.0000 mg | ORAL_TABLET | Freq: Every day | ORAL | 1 refills | Status: DC
Start: 2020-04-27 — End: 2020-10-17

## 2020-04-27 MED ORDER — PANTOPRAZOLE SODIUM 40 MG PO TBEC: 40.0000 mg | DELAYED_RELEASE_TABLET | Freq: Two times a day (BID) | ORAL | 1 refills | Status: DC

## 2020-04-27 MED ORDER — METFORMIN HCL 1000 MG PO TABS: ORAL_TABLET | ORAL | 0 refills | Status: DC

## 2020-04-27 MED ORDER — FINASTERIDE 5 MG PO TABS: 5.0000 mg | ORAL_TABLET | Freq: Every day | ORAL | 1 refills | Status: DC

## 2020-05-04 ENCOUNTER — Encounter: Payer: Self-pay | Admitting: Family Medicine

## 2020-05-04 NOTE — Progress Notes (Signed)
Subjective:  Patient ID: Jared Norman,  male    DOB: [birth year]  Age: 79 y.o.    CC: Medical Management of Chronic Issues and Diabetes (3 mos ckup)   HPI Jared Norman presents for  follow-up of hypertension. Patient has no history of headache chest pain or shortness of breath or recent cough. Patient also denies symptoms of TIA such as numbness weakness lateralizing. Patient denies side effects from medication. States taking it regularly.  Patient also  in for follow-up of elevated cholesterol. Doing well without complaints on current medication. Denies side effects  including myalgia and arthralgia and nausea. Also in today for liver function testing. Currently no chest pain, shortness of breath or other cardiovascular related symptoms noted.  Follow-up of diabetes. Patient does check blood sugar at home. Readings are usually pretty good. Occasionally high. Patient denies symptoms such as excessive hunger or urinary frequency, excessive hunger, nausea No significant hypoglycemic spells noted. Medications reviewed. Pt reports taking them regularly. Pt. denies complication/adverse reaction today.   Although rare, he reports some odd weak spells.  He just feels weak all over.  These are self resolving with rest.  Lasting just a few minutes sometimes a bit longer other times.   History Jared Norman has a past medical history of Arthritis, BPH (benign prostatic hyperplasia), Cataract, Gallstones, Hyperlipidemia, Hypertension, Kidney stones, Type 2 diabetes mellitus ([place]), and Vitamin D deficiency.   He has a past surgical history that includes Cystoscopy with retrograde pyelogram, ureteroscopy and stent placement (Left, 12/20/2013); Foot surgery (Right, 2008); Laparoscopic cholecystectomy (05/2018); and Cataract extraction, bilateral.   His family history includes ALS in his brother; Bladder Cancer in his father; Blindness in his father and paternal grandfather; Diabetes in his brother;  Hypertension in his brother.He reports that he has never smoked. He has never used smokeless tobacco. He reports that he does not drink alcohol and does not use drugs.  Current Outpatient Medications on File Prior to Visit  Medication Sig Dispense Refill  . aspirin EC 81 MG tablet Take 81 mg by mouth every Monday, Wednesday, and Friday.    . cholecalciferol (VITAMIN D) 1000 UNITS tablet Take 2,000 Units by mouth daily.    Marland Kitchen EASY TOUCH LANCETS 26G MISC Use to check BS daily DX E11.9 100 each 3  . glucose blood (ONETOUCH VERIO) test strip Check glucose up to 4 times daily Dx E11.9 400 each 3  . magnesium gluconate (MAGONATE) 500 MG tablet Take 1,000 mg by mouth daily.     . meloxicam (MOBIC) 15 MG tablet Take 1 tablet by mouth once daily 90 tablet 0   No current facility-administered medications on file prior to visit.    ROS Review of Systems  Constitutional: Negative.   HENT: Negative.   Eyes: Negative for visual disturbance.  Respiratory: Negative for cough and shortness of breath.   Cardiovascular: Negative for chest pain and leg swelling.  Gastrointestinal: Positive for abdominal pain (Occasional catch under the costal margin at the ribs denies chest pain dyspnea nausea vomiting and diarrhea.  this is self resolving.). Negative for diarrhea, nausea and vomiting.  Genitourinary: Negative for difficulty urinating.  Musculoskeletal: Negative for arthralgias and myalgias.  Skin: Negative for rash.  Neurological: Positive for weakness (Spells as noted in HPI see HPI). Negative for headaches.  Psychiatric/Behavioral: Negative for sleep disturbance.    Objective:  BP (!) 180/84   Pulse 64   Temp 98.3 F (36.8 C) (Temporal)   Ht '5\' 3"'  (1.6 m)  Wt 177 lb 3.2 oz (80.4 kg)   BMI 31.39 kg/m   BP Readings from Last 3 Encounters:  04/27/20 (!) 180/84  02/16/20 (!) 186/95  01/26/20 (!) 165/85    Wt Readings from Last 3 Encounters:  04/27/20 177 lb 3.2 oz (80.4 kg)  02/16/20 175 lb  (79.4 kg)  01/26/20 178 lb (80.7 kg)     Physical Exam  Diabetic Foot Exam - Simple   No data filed       Assessment & Plan:   Jared Norman was seen today for medical management of chronic issues and diabetes.  Diagnoses and all orders for this visit:  Diabetes mellitus without complication ([place]) -     CBC with Differential/Platelet -     CMP14+EGFR -     Lipid panel -     Cancel: Hemoglobin A1c -     simvastatin (ZOCOR) 40 MG tablet; Take 0.5 tablets (20 mg total) by mouth at bedtime. -     metFORMIN (GLUCOPHAGE) 1000 MG tablet; Take 1 tablet by mouth once daily with breakfast -     Bayer DCA Hb A1c Waived  Primary hypertension -     CBC with Differential/Platelet -     CMP14+EGFR -     Lipid panel -     Cancel: Hemoglobin A1c  Mixed hyperlipidemia -     CBC with Differential/Platelet -     CMP14+EGFR -     Lipid panel -     Cancel: Hemoglobin A1c  Gastroesophageal reflux disease without esophagitis -     pantoprazole (PROTONIX) 40 MG tablet; Take 1 tablet (40 mg total) by mouth 2 (two) times daily.  Benign prostatic hyperplasia with lower urinary tract symptoms, symptom details unspecified -     finasteride (PROSCAR) 5 MG tablet; Take 1 tablet (5 mg total) by mouth daily.  Other orders -     olmesartan (BENICAR) 40 MG tablet; Take 1 tablet (40 mg total) by mouth daily. For blood pressure   I have changed Jared Norman's finasteride. I am also having him maintain his magnesium gluconate, cholecalciferol, aspirin EC, Easy Touch Lancets 26G, OneTouch Verio, meloxicam, simvastatin, pantoprazole, olmesartan, and metFORMIN.  Meds ordered this encounter  Medications  . simvastatin (ZOCOR) 40 MG tablet    Sig: Take 0.5 tablets (20 mg total) by mouth at bedtime.    Dispense:  90 tablet    Refill:  1  . pantoprazole (PROTONIX) 40 MG tablet    Sig: Take 1 tablet (40 mg total) by mouth 2 (two) times daily.    Dispense:  180 tablet    Refill:  1  . olmesartan (BENICAR)  40 MG tablet    Sig: Take 1 tablet (40 mg total) by mouth daily. For blood pressure    Dispense:  90 tablet    Refill:  1  . metFORMIN (GLUCOPHAGE) 1000 MG tablet    Sig: Take 1 tablet by mouth once daily with breakfast    Dispense:  90 tablet    Refill:  0  . finasteride (PROSCAR) 5 MG tablet    Sig: Take 1 tablet (5 mg total) by mouth daily.    Dispense:  90 tablet    Refill:  1   Mr. Schnepp his wife keeps a close eye on his health for him.  She will help with monitoring his blood pressure.  Consider increase of medication.  Follow-up 3 months and as needed for home blood pressure elevations.  Follow-up: Return in  about 3 months (around 07/26/2020) for diabetes, hypertension.  Claretta Fraise, M.D.

## 2020-05-16 ENCOUNTER — Other Ambulatory Visit: Payer: Self-pay | Admitting: Family Medicine

## 2020-05-16 ENCOUNTER — Telehealth: Payer: Self-pay

## 2020-05-16 DIAGNOSIS — R0789 Other chest pain: Secondary | ICD-10-CM | POA: Diagnosis not present

## 2020-05-16 DIAGNOSIS — I10 Essential (primary) hypertension: Secondary | ICD-10-CM | POA: Diagnosis not present

## 2020-05-16 MED ORDER — NIFEDIPINE ER OSMOTIC RELEASE 30 MG PO TB24
30.0000 mg | ORAL_TABLET | Freq: Every day | ORAL | 0 refills | Status: DC
Start: 2020-05-16 — End: 2020-06-15

## 2020-05-16 NOTE — Telephone Encounter (Signed)
Patient aware and verbalizes understanding. Appointment scheduled  Spoke with wife and she is taking patient to urgent care at this time due to his bp.

## 2020-05-16 NOTE — Telephone Encounter (Signed)
Start nifedine - scrip sent. Add him to my aacute day this Friday. Thanks

## 2020-05-16 NOTE — Telephone Encounter (Signed)
I spoke to pt's wife and he has no other symptoms but they are concerned with his BP readings today. He has been hydrating today and resting in his chair and he doesn't want to go to hospital. Is there anything they can do at home? His heart rate is in the 60-70's. Please advise.

## 2020-05-19 ENCOUNTER — Encounter: Payer: Self-pay | Admitting: Family Medicine

## 2020-05-19 ENCOUNTER — Ambulatory Visit (INDEPENDENT_AMBULATORY_CARE_PROVIDER_SITE_OTHER): Payer: PPO | Admitting: Family Medicine

## 2020-05-19 ENCOUNTER — Other Ambulatory Visit: Payer: Self-pay

## 2020-05-19 VITALS — BP 161/87 | HR 84 | Temp 97.2°F | Ht 63.0 in | Wt 174.6 lb

## 2020-05-19 DIAGNOSIS — I4510 Unspecified right bundle-branch block: Secondary | ICD-10-CM

## 2020-05-19 DIAGNOSIS — E1142 Type 2 diabetes mellitus with diabetic polyneuropathy: Secondary | ICD-10-CM | POA: Diagnosis not present

## 2020-05-19 DIAGNOSIS — I10 Essential (primary) hypertension: Secondary | ICD-10-CM

## 2020-05-19 NOTE — Progress Notes (Signed)
Subjective:  Patient ID: Jared Norman, male    DOB: 09-06-1940  Age: 80 y.o. MRN: [id number]  CC: Hypertension   HPI Jared Norman presents for  follow-up of hypertension. Patient has no history of headache chest pain or shortness of breath or recent cough. Patient also denies symptoms of TIA such as focal numbness or weakness.  Patient patient called in with multiple home readings elevated into the 170s to 190s for 3 days ago.  I added nifedipine extended release 30 mg a day.  With plan for him to follow-up today.  During that time his readings have dropped from the 170s all the way to 120.  He had a couple readings as low as 108.  I have asked for those readings to be scanned and attached.  Patient denies side effects from medication. States taking it regularly.  Did go to the emergency room on January 4.  EKG from 6:47 PM is reviewed.  Appears that there is a right bundle branch block on review.   Patient also brought in his blood sugar readings and they appear to be in a good range between 110 and 141 of 254 couple of weeks ago.  Pulse readings are in the high 60s to low 80s on his home documentation.   History Jared Norman has a past medical history of Arthritis, BPH (benign prostatic hyperplasia), Cataract, Gallstones, Hyperlipidemia, Hypertension, Kidney stones, Type 2 diabetes mellitus ([place]), and Vitamin D deficiency.   He has a past surgical history that includes Cystoscopy with retrograde pyelogram, ureteroscopy and stent placement (Left, 12/20/2013); Foot surgery (Right, 2008); Laparoscopic cholecystectomy (05/2018); and Cataract extraction, bilateral.   His family history includes ALS in his brother; Bladder Cancer in his father; Blindness in his father and paternal grandfather; Diabetes in his brother; Hypertension in his brother.He reports that he has never smoked. He has never used smokeless tobacco. He reports that he does not drink alcohol and does not use drugs.  Current  Outpatient Medications on File Prior to Visit  Medication Sig Dispense Refill  . aspirin EC 81 MG tablet Take 81 mg by mouth every Monday, Wednesday, and Friday.    . cholecalciferol (VITAMIN D) 1000 UNITS tablet Take 2,000 Units by mouth daily.    Marland Kitchen EASY TOUCH LANCETS 26G MISC Use to check BS daily DX E11.9 100 each 3  . finasteride (PROSCAR) 5 MG tablet Take 1 tablet (5 mg total) by mouth daily. 90 tablet 1  . glucose blood (ONETOUCH VERIO) test strip Check glucose up to 4 times daily Dx E11.9 400 each 3  . magnesium gluconate (MAGONATE) 500 MG tablet Take 1,000 mg by mouth daily.     . meloxicam (MOBIC) 15 MG tablet Take 1 tablet by mouth once daily 90 tablet 0  . metFORMIN (GLUCOPHAGE) 1000 MG tablet Take 1 tablet by mouth once daily with breakfast 90 tablet 0  . NIFEdipine (PROCARDIA-XL/NIFEDICAL-XL) 30 MG 24 hr tablet Take 1 tablet (30 mg total) by mouth daily. 30 tablet 0  . olmesartan (BENICAR) 40 MG tablet Take 1 tablet (40 mg total) by mouth daily. For blood pressure 90 tablet 1  . pantoprazole (PROTONIX) 40 MG tablet Take 1 tablet (40 mg total) by mouth 2 (two) times daily. 180 tablet 1  . simvastatin (ZOCOR) 40 MG tablet Take 0.5 tablets (20 mg total) by mouth at bedtime. 90 tablet 1   No current facility-administered medications on file prior to visit.    ROS Review of Systems  Constitutional: Negative for fever.  Respiratory: Negative for shortness of breath.   Cardiovascular: Negative for chest pain.  Musculoskeletal: Negative for arthralgias.  Skin: Negative for rash.    Objective:  BP (!) 161/87   Pulse 84   Temp (!) 97.2 F (36.2 C) (Temporal)   Ht '5\' 3"'  (1.6 m)   Wt 174 lb 9.6 oz (79.2 kg)   BMI 30.93 kg/m   BP Readings from Last 3 Encounters:  05/19/20 (!) 161/87  04/27/20 (!) 180/84  02/16/20 (!) 186/95    Wt Readings from Last 3 Encounters:  05/19/20 174 lb 9.6 oz (79.2 kg)  04/27/20 177 lb 3.2 oz (80.4 kg)  02/16/20 175 lb (79.4 kg)      Physical Exam Vitals reviewed.  Constitutional:      Appearance: He is well-developed and well-nourished.  HENT:     Head: Normocephalic and atraumatic.     Right Ear: External ear normal.     Left Ear: External ear normal.     Mouth/Throat:     Pharynx: No oropharyngeal exudate or posterior oropharyngeal erythema.  Eyes:     Pupils: Pupils are equal, round, and reactive to light.  Cardiovascular:     Rate and Rhythm: Normal rate and regular rhythm.     Heart sounds: No murmur heard.   Pulmonary:     Effort: No respiratory distress.     Breath sounds: Normal breath sounds.  Musculoskeletal:     Cervical back: Normal range of motion and neck supple.  Neurological:     Mental Status: He is alert and oriented to person, place, and time.       Assessment & Plan:   Jared Norman was seen today for hypertension.  Diagnoses and all orders for this visit:  Accelerated hypertension -     CMP14+EGFR -     Ambulatory referral to Cardiology  RBBB (right bundle branch block)  Diabetic polyneuropathy associated with type 2 diabetes mellitus ([place])   Allergies as of 05/19/2020   No Known Allergies     Medication List       Accurate as of May 19, 2020 11:15 AM. If you have any questions, ask your nurse or doctor.        aspirin EC 81 MG tablet Take 81 mg by mouth every Monday, Wednesday, and Friday.   cholecalciferol 1000 units tablet Commonly known as: VITAMIN D Take 2,000 Units by mouth daily.   Easy Touch Lancets 26G Misc Use to check BS daily DX E11.9   finasteride 5 MG tablet Commonly known as: PROSCAR Take 1 tablet (5 mg total) by mouth daily.   magnesium gluconate 500 MG tablet Commonly known as: MAGONATE Take 1,000 mg by mouth daily.   meloxicam 15 MG tablet Commonly known as: MOBIC Take 1 tablet by mouth once daily   metFORMIN 1000 MG tablet Commonly known as: GLUCOPHAGE Take 1 tablet by mouth once daily with breakfast   NIFEdipine 30 MG 24  hr tablet Commonly known as: PROCARDIA-XL/NIFEDICAL-XL Take 1 tablet (30 mg total) by mouth daily.   olmesartan 40 MG tablet Commonly known as: Benicar Take 1 tablet (40 mg total) by mouth daily. For blood pressure   OneTouch Verio test strip Generic drug: glucose blood Check glucose up to 4 times daily Dx E11.9   pantoprazole 40 MG tablet Commonly known as: PROTONIX Take 1 tablet (40 mg total) by mouth 2 (two) times daily.   simvastatin 40 MG tablet Commonly known as: ZOCOR Take  0.5 tablets (20 mg total) by mouth at bedtime.         The blood pressure seems to have improved I think the current dose and the therapies can work out well.  When he follows up in a month we will make further decisions.  Due to the instability of his blood pressure going up and down I would like to have feedback from cardiology.  Specifically on her relates this back to presence of the right bundle branch block to see if that is contributing.  Follow-up: Return in about 1 month (around 06/19/2020).  Claretta Fraise, M.D.

## 2020-05-20 LAB — CMP14+EGFR
ALT: 33 IU/L (ref 0–44)
AST: 26 IU/L (ref 0–40)
Albumin/Globulin Ratio: 2.4 — ABNORMAL HIGH (ref 1.2–2.2)
Albumin: 4.7 g/dL (ref 3.7–4.7)
Alkaline Phosphatase: 80 IU/L (ref 44–121)
BUN/Creatinine Ratio: 18 (ref 10–24)
BUN: 27 mg/dL (ref 8–27)
Bilirubin Total: 1.1 mg/dL (ref 0.0–1.2)
CO2: 24 mmol/L (ref 20–29)
Calcium: 9.4 mg/dL (ref 8.6–10.2)
Chloride: 100 mmol/L (ref 96–106)
Creatinine, Ser: 1.52 mg/dL — ABNORMAL HIGH (ref 0.76–1.27)
GFR calc Af Amer: 49 mL/min/{1.73_m2} — ABNORMAL LOW (ref >59)
GFR calc non Af Amer: 43 mL/min/{1.73_m2} — ABNORMAL LOW (ref >59)
Globulin, Total: 2 g/dL (ref 1.5–4.5)
Glucose: 138 mg/dL — ABNORMAL HIGH (ref 65–99)
Potassium: 5 mmol/L (ref 3.5–5.2)
Sodium: 139 mmol/L (ref 134–144)
Total Protein: 6.7 g/dL (ref 6.0–8.5)

## 2020-05-22 NOTE — Progress Notes (Signed)
Hello Aaro,  Your lab result is normal and/or stable.Some minor variations that are not significant are commonly marked abnormal, but do not represent any medical problem for you.  Best regards, Gerod Caligiuri, M.D.

## 2020-05-23 ENCOUNTER — Other Ambulatory Visit: Payer: Self-pay

## 2020-05-23 ENCOUNTER — Encounter (INDEPENDENT_AMBULATORY_CARE_PROVIDER_SITE_OTHER): Payer: PPO | Admitting: Ophthalmology

## 2020-05-23 DIAGNOSIS — E113513 Type 2 diabetes mellitus with proliferative diabetic retinopathy with macular edema, bilateral: Secondary | ICD-10-CM | POA: Diagnosis not present

## 2020-05-23 DIAGNOSIS — I10 Essential (primary) hypertension: Secondary | ICD-10-CM | POA: Diagnosis not present

## 2020-05-23 DIAGNOSIS — H35033 Hypertensive retinopathy, bilateral: Secondary | ICD-10-CM

## 2020-05-23 DIAGNOSIS — H43813 Vitreous degeneration, bilateral: Secondary | ICD-10-CM | POA: Diagnosis not present

## 2020-05-23 LAB — HM DIABETES EYE EXAM

## 2020-05-26 ENCOUNTER — Encounter (INDEPENDENT_AMBULATORY_CARE_PROVIDER_SITE_OTHER): Payer: PPO | Admitting: Ophthalmology

## 2020-06-12 ENCOUNTER — Encounter (HOSPITAL_BASED_OUTPATIENT_CLINIC_OR_DEPARTMENT_OTHER): Payer: Self-pay | Admitting: *Deleted

## 2020-06-12 ENCOUNTER — Other Ambulatory Visit: Payer: Self-pay

## 2020-06-12 ENCOUNTER — Telehealth: Payer: Self-pay

## 2020-06-12 ENCOUNTER — Emergency Department (HOSPITAL_BASED_OUTPATIENT_CLINIC_OR_DEPARTMENT_OTHER)
Admission: EM | Admit: 2020-06-12 | Discharge: 2020-06-12 | Disposition: A | Discharge status: Home / Self Care | Payer: PPO | Attending: Emergency Medicine | Admitting: Emergency Medicine

## 2020-06-12 DIAGNOSIS — I159 Secondary hypertension, unspecified: Secondary | ICD-10-CM | POA: Insufficient documentation

## 2020-06-12 DIAGNOSIS — Z7982 Long term (current) use of aspirin: Secondary | ICD-10-CM | POA: Insufficient documentation

## 2020-06-12 DIAGNOSIS — E1140 Type 2 diabetes mellitus with diabetic neuropathy, unspecified: Secondary | ICD-10-CM | POA: Insufficient documentation

## 2020-06-12 DIAGNOSIS — Z79899 Other long term (current) drug therapy: Secondary | ICD-10-CM | POA: Insufficient documentation

## 2020-06-12 DIAGNOSIS — Z7984 Long term (current) use of oral hypoglycemic drugs: Secondary | ICD-10-CM | POA: Insufficient documentation

## 2020-06-12 DIAGNOSIS — I10 Essential (primary) hypertension: Secondary | ICD-10-CM | POA: Diagnosis present

## 2020-06-12 LAB — COMPREHENSIVE METABOLIC PANEL
ALT: 27 U/L (ref 0–44)
AST: 28 U/L (ref 15–41)
Albumin: 4.4 g/dL (ref 3.5–5.0)
Alkaline Phosphatase: 68 U/L (ref 38–126)
Anion gap: 12 (ref 5–15)
BUN: 18 mg/dL (ref 8–23)
CO2: 24 mmol/L (ref 22–32)
Calcium: 9.3 mg/dL (ref 8.9–10.3)
Chloride: 101 mmol/L (ref 98–111)
Creatinine, Ser: 1.12 mg/dL (ref 0.61–1.24)
GFR, Estimated: >60 mL/min (ref >60)
Glucose, Bld: 143 mg/dL — ABNORMAL HIGH (ref 70–99)
Potassium: 4.5 mmol/L (ref 3.5–5.1)
Sodium: 137 mmol/L (ref 135–145)
Total Bilirubin: 1.1 mg/dL (ref 0.3–1.2)
Total Protein: 7.2 g/dL (ref 6.5–8.1)

## 2020-06-12 LAB — CBC WITH DIFFERENTIAL/PLATELET
Abs Immature Granulocytes: 0.04 10*3/uL (ref 0.00–0.07)
Basophils Absolute: 0.1 10*3/uL (ref 0.0–0.1)
Basophils Relative: 1 %
Eosinophils Absolute: 0.1 10*3/uL (ref 0.0–0.5)
Eosinophils Relative: 1 %
HCT: 46.7 % (ref 39.0–52.0)
Hemoglobin: 15.3 g/dL (ref 13.0–17.0)
Immature Granulocytes: 0 %
Lymphocytes Relative: 26 %
Lymphs Abs: 2.4 10*3/uL (ref 0.7–4.0)
MCH: 30.5 pg (ref 26.0–34.0)
MCHC: 32.8 g/dL (ref 30.0–36.0)
MCV: 93 fL (ref 80.0–100.0)
Monocytes Absolute: 0.7 10*3/uL (ref 0.1–1.0)
Monocytes Relative: 8 %
Neutro Abs: 5.9 10*3/uL (ref 1.7–7.7)
Neutrophils Relative %: 64 %
Platelets: 230 10*3/uL (ref 150–400)
RBC: 5.02 MIL/uL (ref 4.22–5.81)
RDW: 14.5 % (ref 11.5–15.5)
WBC: 9.3 10*3/uL (ref 4.0–10.5)
nRBC: 0 % (ref 0.0–0.2)

## 2020-06-12 LAB — TROPONIN I (HIGH SENSITIVITY): Troponin I (High Sensitivity): 6 ng/L (ref <18)

## 2020-06-12 MED ORDER — NIFEDIPINE ER OSMOTIC RELEASE 30 MG PO TB24
30.0000 mg | ORAL_TABLET | Freq: Once | ORAL | Status: AC
  Administered 2020-06-12: 30 mg via ORAL
  Filled 2020-06-12: qty 1

## 2020-06-12 NOTE — Telephone Encounter (Signed)
Patient aware and states that he has already taken a 2nd nifedipine and blood pressure is still 200s/100s.  Patient also states that he currently on the way to an urgent care or ER to be evaluated.

## 2020-06-12 NOTE — Discharge Instructions (Signed)
You have been seen and discharged from the emergency department.  Follow-up with your primary provider for reevaluation.  Continue to take your home hypertensive medications as prescribed. If you have any worsening symptoms, headache, strokelike symptoms, chest pain/shortness of breath or further concerns for health please return to an emergency department for further evaluation.

## 2020-06-12 NOTE — Telephone Encounter (Signed)
Spouse says that bp has been running high. Spouse wants to know can there be a change in the bp medication with what he is already taking now.  Readings 06/11/2020 am 193/88 pm 188/97 06/12/2020 am 183/99 after meds 225/117

## 2020-06-12 NOTE — Telephone Encounter (Signed)
Spoke with patient - had him re take his bp while we were on the phone and it was 211/117.  States that he is not having any numbness, chest pain, sob or any other symptoms.  States he feels fine.  Patient has an upcoming appointment with Dr. Livia Snellen on Monday 2/7.  Please advise if medication can be added or increased? Aware if BP rises before we call back or starts having any symptoms to seek medical attention. Patient and wife voiced understanding.

## 2020-06-12 NOTE — Telephone Encounter (Signed)
Have him start taking the nifedipine Two a day. First one now.

## 2020-06-12 NOTE — ED Provider Notes (Signed)
[facility] EMERGENCY DEPARTMENT Provider Note   CSN: [id number] Arrival date & time: 06/12/20  1427     History Chief Complaint  Patient presents with  . Hypertension    Jared Norman is a 80 y.o. male.  HPI   80 year old male with past medical history of HTN, DM, HLD presents the emergency department with concern for hypertension.  Patient is accompanied by his wife.  They state over the last day his blood pressure has been elevated.  Typically his systolic is 063 or below.  They state that he had a couple high readings in one with a systolic as high as 016.  Patient has had no symptoms, denies any headache, vision changes, neuro symptoms, chest pain or shortness of breath.  Patient called his primary doctor but was unable to get into they came in for evaluation.  No recent fever or illness.  He has been compliant with his medications, no recent medication adjustments.  Past Medical History:  Diagnosis Date  . Arthritis   . BPH (benign prostatic hyperplasia)   . Cataract   . Gallstones   . Hyperlipidemia   . Hypertension   . Kidney stones   . Type 2 diabetes mellitus ([place])   . Vitamin D deficiency     Patient Active Problem List   Diagnosis Date Noted  . RBBB (right bundle branch block) 05/19/2020  . GERD (gastroesophageal reflux disease) 05/26/2018  . Bilateral sensorineural hearing loss 01/21/2018  . Diabetic neuropathy ([place]) 05/26/2015  . Arthritis of knee 05/26/2015  . BPH (benign prostatic hyperplasia)   . Vitamin D deficiency   . Diabetes mellitus without complication ([place])   . Hyperlipidemia   . Hypertension     Past Surgical History:  Procedure Laterality Date  . CATARACT EXTRACTION, BILATERAL    . CYSTOSCOPY WITH RETROGRADE PYELOGRAM, URETEROSCOPY AND STENT PLACEMENT Left 12/20/2013   Procedure: CYSTOSCOPY WITHLEFT RETROGRADE PYELOGRAM,  AND LEFT STENT PLACEMENT;  Surgeon: Malka So, MD;  Location: [facility] ORS;  Service: Urology;  Laterality:  Left;  . FOOT SURGERY Right 2008   Tendon rupture  . LAPAROSCOPIC CHOLECYSTECTOMY  05/2018   Dr. Ladona Horns       Family History  Problem Relation Age of Onset  . ALS Brother        Agent Orange  . Hypertension Brother   . Diabetes Brother   . Bladder Cancer Father   . Blindness Father   . Blindness Paternal Grandfather     Social History   Tobacco Use  . Smoking status: Never Smoker  . Smokeless tobacco: Never Used  Vaping Use  . Vaping Use: Never used  Substance Use Topics  . Alcohol use: No  . Drug use: No    Home Medications Prior to Admission medications   Medication Sig Start Date End Date Taking? Authorizing Provider  aspirin EC 81 MG tablet Take 81 mg by mouth every Monday, Wednesday, and Friday.    [provider]  cholecalciferol (VITAMIN D) 1000 UNITS tablet Take 2,000 Units by mouth daily.    [provider]  EASY TOUCH LANCETS 26G MISC Use to check BS daily DX E11.9 06/01/18   Claretta Fraise, MD  finasteride (PROSCAR) 5 MG tablet Take 1 tablet (5 mg total) by mouth daily. 04/27/20   Claretta Fraise, MD  glucose blood (ONETOUCH VERIO) test strip Check glucose up to 4 times daily Dx E11.9 04/14/20   Claretta Fraise, MD  magnesium gluconate (MAGONATE) 500 MG tablet  Take 1,000 mg by mouth daily.     [provider]  meloxicam (MOBIC) 15 MG tablet Take 1 tablet by mouth once daily 04/24/20   Claretta Fraise, MD  metFORMIN (GLUCOPHAGE) 1000 MG tablet Take 1 tablet by mouth once daily with breakfast 04/27/20   Claretta Fraise, MD  NIFEdipine (PROCARDIA-XL/NIFEDICAL-XL) 30 MG 24 hr tablet Take 1 tablet (30 mg total) by mouth daily. 05/16/20   Claretta Fraise, MD  olmesartan (BENICAR) 40 MG tablet Take 1 tablet (40 mg total) by mouth daily. For blood pressure 04/27/20   Claretta Fraise, MD  pantoprazole (PROTONIX) 40 MG tablet Take 1 tablet (40 mg total) by mouth 2 (two) times daily. 04/27/20   Claretta Fraise, MD  simvastatin (ZOCOR) 40 MG tablet Take  0.5 tablets (20 mg total) by mouth at bedtime. 04/27/20   Claretta Fraise, MD    Allergies    Patient has no known allergies.  Review of Systems   Review of Systems  Constitutional: Negative for chills and fever.  HENT: Negative for congestion.   Eyes: Negative for visual disturbance.  Respiratory: Negative for chest tightness and shortness of breath.   Cardiovascular: Negative for chest pain and palpitations.  Gastrointestinal: Negative for abdominal pain, diarrhea and vomiting.  Genitourinary: Negative for dysuria.  Skin: Negative for rash.  Neurological: Negative for dizziness, facial asymmetry, speech difficulty, weakness, numbness and headaches.    Physical Exam Updated Vital Signs BP (!) 145/90 (BP Location: Right Arm)   Pulse 94   Temp 97.8 F (36.6 C) (Oral)   Resp 18   Ht 5\' 5"  (1.651 m)   Wt 72.6 kg   SpO2 99%   BMI 26.63 kg/m   Physical Exam Vitals and nursing note reviewed.  Constitutional:      Appearance: Normal appearance.  HENT:     Head: Normocephalic.     Mouth/Throat:     Mouth: Mucous membranes are moist.  Cardiovascular:     Rate and Rhythm: Normal rate.  Pulmonary:     Effort: Pulmonary effort is normal. No respiratory distress.  Abdominal:     Palpations: Abdomen is soft.     Tenderness: There is no abdominal tenderness.  Skin:    General: Skin is warm.  Neurological:     Mental Status: He is alert and oriented to person, place, and time. Mental status is at baseline.  Psychiatric:        Mood and Affect: Mood normal.     ED Results / Procedures / Treatments   Labs (all labs ordered are listed, but only abnormal results are displayed) Labs Reviewed  COMPREHENSIVE METABOLIC PANEL - Abnormal; Notable for the following components:      Result Value   Glucose, Bld 143 (*)    All other components within normal limits  CBC WITH DIFFERENTIAL/PLATELET  TROPONIN I (HIGH SENSITIVITY)    EKG EKG Interpretation  Date/Time:  Monday  June 12 2020 14:42:13 EST Ventricular Rate:  99 PR Interval:  196 QRS Duration: 126 QT Interval:  382 QTC Calculation: 490 R Axis:   102 Text Interpretation: Sinus rhythm with occasional Premature ventricular complexes Indeterminate axis Right bundle branch block T wave abnormality, consider inferior ischemia Abnormal ECG No significant change since prior 9/20 Confirmed by Aletta Edouard [id number]) on 06/12/2020 2:51:49 PM   Radiology No results found.  Procedures Procedures   Medications Ordered in ED Medications  NIFEdipine (PROCARDIA-XL/NIFEDICAL-XL) 24 hr tablet 30 mg (30 mg Oral Given 06/12/20 1950)  ED Course  I have reviewed the triage vital signs and the nursing notes.  Pertinent labs & imaging results that were available during my care of the patient were reviewed by me and considered in my medical decision making (see chart for details).    MDM Rules/Calculators/A&P                          80 year old male presents the emergency department for reported hypertension.  He has been compliant with his medications, no recent adjustments.  He is asymptomatic.  Denies any acute symptoms, sitting up in bed and well-appearing.  His blood pressure is slightly elevated here with a systolic around [id number] on arrival.  Blood work is reassuring, no acute abnormalities, troponin is negative, EKG shows a right bundle branch block which is unchanged.  Patient was given an additional dose of his home [place] pain, blood pressure reduced to [id number] systolic.  He feels very well.  Instructed the patient to call the primary doctor tomorrow for possible medication adjustment and follow-up blood pressure.  Given that he is asymptomatic no need for any further emergent work-up.  Patient will be discharged and treated as an outpatient.  Discharge plan and strict return to ED precautions discussed, patient verbalizes understanding and agreement.  Final Clinical Impression(s) / ED Diagnoses Final  diagnoses:  Secondary hypertension    Rx / DC Orders ED Discharge Orders    None       Lorelle Gibbs, DO 06/12/20 2109

## 2020-06-12 NOTE — ED Notes (Signed)
BEFAST assessment is negative.

## 2020-06-12 NOTE — ED Notes (Signed)
States for the past 2 days has noted his BP at home was elevated, stated he noted at times his SBP >262mmHg, NBP was assessed here via [facility] in both arms and documented, highest SPB here is 174mmHg. Denies any HA, vision issues, light headiness, chest pain, or SOB.

## 2020-06-12 NOTE — ED Triage Notes (Addendum)
C/o " increased Blood pressure " x 3 days , denies any other complaints , BP med change x 3 weeks ago

## 2020-06-13 ENCOUNTER — Telehealth: Payer: Self-pay

## 2020-06-13 NOTE — Telephone Encounter (Signed)
Spoke with wife, appointment scheduled 06/15/20 at 8:55 am with Dr. Livia Snellen.

## 2020-06-15 ENCOUNTER — Other Ambulatory Visit: Payer: Self-pay

## 2020-06-15 ENCOUNTER — Ambulatory Visit (INDEPENDENT_AMBULATORY_CARE_PROVIDER_SITE_OTHER): Payer: PPO | Admitting: Family Medicine

## 2020-06-15 ENCOUNTER — Encounter: Payer: Self-pay | Admitting: Family Medicine

## 2020-06-15 VITALS — BP 121/70 | HR 83 | Temp 98.1°F | Ht 65.0 in | Wt 157.2 lb

## 2020-06-15 DIAGNOSIS — I10 Essential (primary) hypertension: Secondary | ICD-10-CM

## 2020-06-15 DIAGNOSIS — E1142 Type 2 diabetes mellitus with diabetic polyneuropathy: Secondary | ICD-10-CM | POA: Diagnosis not present

## 2020-06-15 MED ORDER — NIFEDIPINE ER 60 MG PO TB24: 60.0000 mg | ORAL_TABLET | Freq: Every day | ORAL | 1 refills | Status: DC

## 2020-06-15 NOTE — Progress Notes (Signed)
Subjective:  Patient ID: Jared Norman, male    DOB: [birth year]  Age: 80 y.o. MRN: [id number]  CC: ER follow up (Hypertension/)   HPI 182 systolic 3 days ago Jared Norman presents for recheck of blood pressure.  It went up to over 993 systolic 4 days ago.  3 days ago it was still high so he went to the emergency room.  He brings in readings that show that his home readings were 220/117.  His wife got a new her monitor to double check and it was about the same.  He took an extra Procardia and went to the emergency room and blood pressure was still high so they gave him a third Procardia.  By the time he left the emergency room 6 hours later his pressure was down to 145/90.  Through all of this he remains totally asymptomatic.  There is no headache chest pain shortness of breath.  He denies any numbness or weakness of any focal nature.  No vision changes etc.  He brings in multiple glucose readings today that show that its been primarily in the 120s.  He also brings in some of his blood sugar readings showing variation between about 120 and 150 at times.  These are fasting readings.  Depression screen [facility] 2/9 06/15/2020 05/19/2020 04/27/2020  Decreased Interest 0 0 0  Down, Depressed, Hopeless 0 0 0  PHQ - 2 Score 0 0 0    History Jared Norman has a past medical history of Arthritis, BPH (benign prostatic hyperplasia), Cataract, Gallstones, Hyperlipidemia, Hypertension, Kidney stones, Type 2 diabetes mellitus ([place]), and Vitamin D deficiency.   He has a past surgical history that includes Cystoscopy with retrograde pyelogram, ureteroscopy and stent placement (Left, 12/20/2013); Foot surgery (Right, 2008); Laparoscopic cholecystectomy (05/2018); and Cataract extraction, bilateral.   His family history includes ALS in his brother; Bladder Cancer in his father; Blindness in his father and paternal grandfather; Diabetes in his brother; Hypertension in his brother.He reports that he has never smoked. He has  never used smokeless tobacco. He reports that he does not drink alcohol and does not use drugs.    ROS Review of Systems  Constitutional: Negative for fever.  HENT: Negative for congestion.   Eyes: Negative for photophobia and visual disturbance.  Respiratory: Negative for shortness of breath.   Cardiovascular: Negative for chest pain.  Musculoskeletal: Negative for arthralgias.  Skin: Negative for rash.  Neurological: Negative for headaches.    Objective:  BP 121/70   Pulse 83   Temp 98.1 F (36.7 C) (Temporal)   Ht 5\' 5"  (1.651 m)   Wt 157 lb 3.2 oz (71.3 kg)   BMI 26.16 kg/m   BP Readings from Last 3 Encounters:  06/15/20 121/70  06/12/20 (!) 145/90  05/19/20 (!) 161/87    Wt Readings from Last 3 Encounters:  06/15/20 157 lb 3.2 oz (71.3 kg)  06/12/20 160 lb (72.6 kg)  05/19/20 174 lb 9.6 oz (79.2 kg)     Physical Exam Vitals reviewed.  Constitutional:      Appearance: He is well-developed and well-nourished.  HENT:     Head: Normocephalic and atraumatic.     Right Ear: External ear normal.     Left Ear: External ear normal.     Mouth/Throat:     Pharynx: No oropharyngeal exudate or posterior oropharyngeal erythema.  Eyes:     Pupils: Pupils are equal, round, and reactive to light.  Cardiovascular:     Rate and  Rhythm: Normal rate and regular rhythm.     Heart sounds: No murmur heard.   Pulmonary:     Effort: No respiratory distress.     Breath sounds: Normal breath sounds.  Musculoskeletal:     Cervical back: Normal range of motion and neck supple.  Neurological:     Mental Status: He is alert and oriented to person, place, and time.       Assessment & Plan:   Jared Norman was seen today for er follow up.  Diagnoses and all orders for this visit:  Primary hypertension  Diabetic polyneuropathy associated with type 2 diabetes mellitus ([place])       I am having Jared Norman maintain his magnesium gluconate, cholecalciferol, aspirin EC,  Easy Touch Lancets 26G, OneTouch Verio, meloxicam, simvastatin, pantoprazole, olmesartan, metFORMIN, finasteride, and NIFEdipine.  Allergies as of 06/15/2020   No Known Allergies     Medication List       Accurate as of June 15, 2020  1:04 PM. If you have any questions, ask your nurse or doctor.        aspirin EC 81 MG tablet Take 81 mg by mouth every Monday, Wednesday, and Friday.   cholecalciferol 1000 units tablet Commonly known as: VITAMIN D Take 2,000 Units by mouth daily.   Easy Touch Lancets 26G Misc Use to check BS daily DX E11.9   finasteride 5 MG tablet Commonly known as: PROSCAR Take 1 tablet (5 mg total) by mouth daily.   magnesium gluconate 500 MG tablet Commonly known as: MAGONATE Take 1,000 mg by mouth daily.   meloxicam 15 MG tablet Commonly known as: MOBIC Take 1 tablet by mouth once daily   metFORMIN 1000 MG tablet Commonly known as: GLUCOPHAGE Take 1 tablet by mouth once daily with breakfast   NIFEdipine 30 MG 24 hr tablet Commonly known as: PROCARDIA-XL/NIFEDICAL-XL Take 1 tablet (30 mg total) by mouth daily.   olmesartan 40 MG tablet Commonly known as: Benicar Take 1 tablet (40 mg total) by mouth daily. For blood pressure   OneTouch Verio test strip Generic drug: glucose blood Check glucose up to 4 times daily Dx E11.9   pantoprazole 40 MG tablet Commonly known as: PROTONIX Take 1 tablet (40 mg total) by mouth 2 (two) times daily.   simvastatin 40 MG tablet Commonly known as: ZOCOR Take 0.5 tablets (20 mg total) by mouth at bedtime.        Follow-up: No follow-ups on file.  Jared Norman, M.D.

## 2020-06-19 ENCOUNTER — Ambulatory Visit: Payer: PPO | Admitting: Family Medicine

## 2020-06-25 ENCOUNTER — Other Ambulatory Visit: Payer: Self-pay | Admitting: Family Medicine

## 2020-06-25 DIAGNOSIS — N401 Enlarged prostate with lower urinary tract symptoms: Secondary | ICD-10-CM

## 2020-06-26 ENCOUNTER — Encounter (INDEPENDENT_AMBULATORY_CARE_PROVIDER_SITE_OTHER): Payer: PPO | Admitting: Ophthalmology

## 2020-06-26 ENCOUNTER — Other Ambulatory Visit: Payer: Self-pay

## 2020-06-26 DIAGNOSIS — H43813 Vitreous degeneration, bilateral: Secondary | ICD-10-CM

## 2020-06-26 DIAGNOSIS — I10 Essential (primary) hypertension: Secondary | ICD-10-CM

## 2020-06-26 DIAGNOSIS — E113513 Type 2 diabetes mellitus with proliferative diabetic retinopathy with macular edema, bilateral: Secondary | ICD-10-CM

## 2020-06-26 DIAGNOSIS — H35033 Hypertensive retinopathy, bilateral: Secondary | ICD-10-CM

## 2020-07-27 ENCOUNTER — Ambulatory Visit (INDEPENDENT_AMBULATORY_CARE_PROVIDER_SITE_OTHER): Payer: PPO | Admitting: Family Medicine

## 2020-07-27 ENCOUNTER — Encounter: Payer: Self-pay | Admitting: Family Medicine

## 2020-07-27 ENCOUNTER — Other Ambulatory Visit: Payer: Self-pay

## 2020-07-27 VITALS — BP 160/80 | HR 72 | Temp 98.2°F | Ht 65.0 in | Wt 174.8 lb

## 2020-07-27 DIAGNOSIS — E119 Type 2 diabetes mellitus without complications: Secondary | ICD-10-CM | POA: Diagnosis not present

## 2020-07-27 DIAGNOSIS — I10 Essential (primary) hypertension: Secondary | ICD-10-CM | POA: Diagnosis not present

## 2020-07-27 LAB — BAYER DCA HB A1C WAIVED: HB A1C (BAYER DCA - WAIVED): 6.9 % (ref <7.0)

## 2020-07-27 NOTE — Progress Notes (Signed)
Subjective:  Patient ID: Jared Norman,  male    DOB: [birth year]  Age: 80 y.o.    CC: Medical Management of Chronic Issues   HPI Jared Norman presents for  follow-up of hypertension. Patient has no history of headache chest pain or shortness of breath or recent cough. Patient also denies symptoms of Jared Norman such as numbness weakness lateralizing. Patient denies side effects from medication. States taking it regularly. Log returned showing most BPs around 130/80, A few Around 7 AM Are up to 150/90. Rarely SBP is down to 94  Patient also  in for follow-up of elevated cholesterol. Doing well without complaints on current medication. Denies side effects  including myalgia and arthralgia and nausea. Also in today for liver function testing. Currently no chest pain, shortness of breath or other cardiovascular related symptoms noted.  Follow-up of diabetes. Patient does check blood sugar at home. Readings run between 100 and 150 fasting Patient denies symptoms such as excessive hunger or urinary frequency, excessive hunger, nausea No significant hypoglycemic spells noted. Medications reviewed. Pt reports taking them regularly. Pt. denies complication/adverse reaction today.    History Jared Norman has a past medical history of Arthritis, BPH (benign prostatic hyperplasia), Cataract, Gallstones, Hyperlipidemia, Hypertension, Kidney stones, Type 2 diabetes mellitus ([name]), and Vitamin D deficiency.   Jared Norman has a past surgical history that includes Cystoscopy with retrograde pyelogram, ureteroscopy and stent placement (Left, 12/20/2013); Foot surgery (Right, 2008); Laparoscopic cholecystectomy (05/2018); and Cataract extraction, bilateral.   His family history includes ALS in his brother; Bladder Cancer in his father; Blindness in his father and paternal grandfather; Diabetes in his brother; Hypertension in his brother.Jared Norman reports that Jared Norman has never smoked. Jared Norman has never used smokeless tobacco. Jared Norman reports that Jared Norman  does not drink alcohol and does not use drugs.  Current Outpatient Medications on File Prior to Visit  Medication Sig Dispense Refill  . aspirin EC 81 MG tablet Take 81 mg by mouth every Monday, Wednesday, and Friday.    . cholecalciferol (VITAMIN D) 1000 UNITS tablet Take 2,000 Units by mouth daily.    Marland Kitchen EASY TOUCH LANCETS 26G MISC Use to check BS daily DX E11.9 100 each 3  . finasteride (PROSCAR) 5 MG tablet Take 1 tablet by mouth once daily 90 tablet 0  . glucose blood (ONETOUCH VERIO) test strip Check glucose up to 4 times daily Dx E11.9 400 each 3  . magnesium gluconate (MAGONATE) 500 MG tablet Take 1,000 mg by mouth daily.     . meloxicam (MOBIC) 15 MG tablet Take 1 tablet by mouth once daily 90 tablet 0  . metFORMIN (GLUCOPHAGE) 1000 MG tablet Take 1 tablet by mouth once daily with breakfast 90 tablet 0  . NIFEdipine (ADALAT CC) 60 MG 24 hr tablet Take 1 tablet (60 mg total) by mouth daily. For blood pressure 90 tablet 1  . olmesartan (BENICAR) 40 MG tablet Take 1 tablet (40 mg total) by mouth daily. For blood pressure 90 tablet 1  . pantoprazole (PROTONIX) 40 MG tablet Take 1 tablet (40 mg total) by mouth 2 (two) times daily. 180 tablet 1  . simvastatin (ZOCOR) 40 MG tablet Take 0.5 tablets (20 mg total) by mouth at bedtime. 90 tablet 1   No current facility-administered medications on file prior to visit.    ROS Review of Systems  Constitutional: Negative for fever.  Respiratory: Negative for shortness of breath.   Cardiovascular: Negative for chest pain.  Musculoskeletal: Negative for arthralgias.  Skin:  Negative for rash.  Neurological: Positive for numbness (hands go to sleep sometimes).    Objective:  BP (!) 160/80   Pulse 72   Temp 98.2 F (36.8 C)   Ht '5\' 5"'  (1.651 m)   Wt 174 lb 12.8 oz (79.3 kg)   SpO2 98%   BMI 29.09 kg/m   BP Readings from Last 3 Encounters:  07/27/20 (!) 160/80  06/15/20 121/70  06/12/20 (!) 145/90    Wt Readings from Last 3  Encounters:  07/27/20 174 lb 12.8 oz (79.3 kg)  06/15/20 157 lb 3.2 oz (71.3 kg)  06/12/20 160 lb (72.6 kg)     Physical Exam Vitals reviewed.  Constitutional:      Appearance: Jared Norman is well-developed.  HENT:     Head: Normocephalic and atraumatic.     Right Ear: External ear normal.     Left Ear: External ear normal.     Mouth/Throat:     Pharynx: No oropharyngeal exudate or posterior oropharyngeal erythema.  Eyes:     Pupils: Pupils are equal, round, and reactive to light.  Cardiovascular:     Rate and Rhythm: Normal rate and regular rhythm.     Heart sounds: No murmur heard.   Pulmonary:     Effort: No respiratory distress.     Breath sounds: Normal breath sounds.  Musculoskeletal:     Cervical back: Normal range of motion and neck supple.  Neurological:     Mental Status: Jared Norman is alert and oriented to person, place, and time.     Diabetic Foot Exam - Simple   No data filed       Assessment & Plan:   Jared Norman was seen today for medical management of chronic issues.  Diagnoses and all orders for this visit:  Primary hypertension  Diabetes mellitus without complication ([place]) -     Bayer DCA Hb A1c Waived -     CBC with Differential/Platelet -     CMP14+EGFR   I am having Jared Norman maintain his magnesium gluconate, cholecalciferol, aspirin EC, Easy Touch Lancets 26G, OneTouch Verio, meloxicam, simvastatin, pantoprazole, olmesartan, metFORMIN, NIFEdipine, and finasteride.  No orders of the defined types were placed in this encounter.  Attempt to control BP variation by changing one of his BP meds to evening.  Follow-up: No follow-ups on file.  Claretta Fraise, M.D.

## 2020-07-28 LAB — CMP14+EGFR
ALT: 21 IU/L (ref 0–44)
AST: 25 IU/L (ref 0–40)
Albumin/Globulin Ratio: 2.1 (ref 1.2–2.2)
Albumin: 4.5 g/dL (ref 3.7–4.7)
Alkaline Phosphatase: 75 IU/L (ref 44–121)
BUN/Creatinine Ratio: 17 (ref 10–24)
BUN: 20 mg/dL (ref 8–27)
Bilirubin Total: 0.7 mg/dL (ref 0.0–1.2)
CO2: 23 mmol/L (ref 20–29)
Calcium: 9.6 mg/dL (ref 8.6–10.2)
Chloride: 104 mmol/L (ref 96–106)
Creatinine, Ser: 1.15 mg/dL (ref 0.76–1.27)
Globulin, Total: 2.1 g/dL (ref 1.5–4.5)
Glucose: 112 mg/dL — ABNORMAL HIGH (ref 65–99)
Potassium: 5.7 mmol/L — ABNORMAL HIGH (ref 3.5–5.2)
Sodium: 142 mmol/L (ref 134–144)
Total Protein: 6.6 g/dL (ref 6.0–8.5)
eGFR: 64 mL/min/{1.73_m2} (ref >59)

## 2020-07-28 LAB — CBC WITH DIFFERENTIAL/PLATELET
Basophils Absolute: 0.1 10*3/uL (ref 0.0–0.2)
Basos: 1 %
EOS (ABSOLUTE): 0.2 10*3/uL (ref 0.0–0.4)
Eos: 4 %
Hematocrit: 40.7 % (ref 37.5–51.0)
Hemoglobin: 13.8 g/dL (ref 13.0–17.7)
Immature Grans (Abs): 0 10*3/uL (ref 0.0–0.1)
Immature Granulocytes: 0 %
Lymphocytes Absolute: 2.3 10*3/uL (ref 0.7–3.1)
Lymphs: 36 %
MCH: 30.4 pg (ref 26.6–33.0)
MCHC: 33.9 g/dL (ref 31.5–35.7)
MCV: 90 fL (ref 79–97)
Monocytes Absolute: 0.6 10*3/uL (ref 0.1–0.9)
Monocytes: 10 %
Neutrophils Absolute: 3.2 10*3/uL (ref 1.4–7.0)
Neutrophils: 49 %
Platelets: 241 10*3/uL (ref 150–450)
RBC: 4.54 x10E6/uL (ref 4.14–5.80)
RDW: 13.7 % (ref 11.6–15.4)
WBC: 6.5 10*3/uL (ref 3.4–10.8)

## 2020-07-31 ENCOUNTER — Other Ambulatory Visit: Payer: Self-pay

## 2020-07-31 ENCOUNTER — Encounter (INDEPENDENT_AMBULATORY_CARE_PROVIDER_SITE_OTHER): Payer: PPO | Admitting: Ophthalmology

## 2020-07-31 DIAGNOSIS — H35033 Hypertensive retinopathy, bilateral: Secondary | ICD-10-CM

## 2020-07-31 DIAGNOSIS — I10 Essential (primary) hypertension: Secondary | ICD-10-CM

## 2020-07-31 DIAGNOSIS — H43813 Vitreous degeneration, bilateral: Secondary | ICD-10-CM | POA: Diagnosis not present

## 2020-07-31 DIAGNOSIS — E113513 Type 2 diabetes mellitus with proliferative diabetic retinopathy with macular edema, bilateral: Secondary | ICD-10-CM

## 2020-08-21 ENCOUNTER — Other Ambulatory Visit: Payer: Self-pay | Admitting: Family Medicine

## 2020-08-21 DIAGNOSIS — L821 Other seborrheic keratosis: Secondary | ICD-10-CM | POA: Diagnosis not present

## 2020-08-21 DIAGNOSIS — M7731 Calcaneal spur, right foot: Secondary | ICD-10-CM

## 2020-08-21 DIAGNOSIS — L570 Actinic keratosis: Secondary | ICD-10-CM | POA: Diagnosis not present

## 2020-08-21 DIAGNOSIS — N401 Enlarged prostate with lower urinary tract symptoms: Secondary | ICD-10-CM

## 2020-08-21 DIAGNOSIS — Z85828 Personal history of other malignant neoplasm of skin: Secondary | ICD-10-CM | POA: Diagnosis not present

## 2020-09-04 ENCOUNTER — Encounter (INDEPENDENT_AMBULATORY_CARE_PROVIDER_SITE_OTHER): Payer: PPO | Admitting: Ophthalmology

## 2020-09-04 ENCOUNTER — Other Ambulatory Visit: Payer: Self-pay

## 2020-09-04 DIAGNOSIS — H35033 Hypertensive retinopathy, bilateral: Secondary | ICD-10-CM | POA: Diagnosis not present

## 2020-09-04 DIAGNOSIS — I10 Essential (primary) hypertension: Secondary | ICD-10-CM | POA: Diagnosis not present

## 2020-09-04 DIAGNOSIS — H43813 Vitreous degeneration, bilateral: Secondary | ICD-10-CM | POA: Diagnosis not present

## 2020-09-04 DIAGNOSIS — E113513 Type 2 diabetes mellitus with proliferative diabetic retinopathy with macular edema, bilateral: Secondary | ICD-10-CM | POA: Diagnosis not present

## 2020-10-03 ENCOUNTER — Other Ambulatory Visit: Payer: Self-pay | Admitting: Family Medicine

## 2020-10-03 DIAGNOSIS — E119 Type 2 diabetes mellitus without complications: Secondary | ICD-10-CM

## 2020-10-12 ENCOUNTER — Other Ambulatory Visit: Payer: Self-pay

## 2020-10-12 ENCOUNTER — Encounter (INDEPENDENT_AMBULATORY_CARE_PROVIDER_SITE_OTHER): Payer: PPO | Admitting: Ophthalmology

## 2020-10-12 DIAGNOSIS — H35033 Hypertensive retinopathy, bilateral: Secondary | ICD-10-CM

## 2020-10-12 DIAGNOSIS — H43813 Vitreous degeneration, bilateral: Secondary | ICD-10-CM

## 2020-10-12 DIAGNOSIS — E113513 Type 2 diabetes mellitus with proliferative diabetic retinopathy with macular edema, bilateral: Secondary | ICD-10-CM | POA: Diagnosis not present

## 2020-10-12 DIAGNOSIS — I10 Essential (primary) hypertension: Secondary | ICD-10-CM | POA: Diagnosis not present

## 2020-10-16 ENCOUNTER — Other Ambulatory Visit: Payer: Self-pay | Admitting: Family Medicine

## 2020-10-30 DIAGNOSIS — S8264XA Nondisplaced fracture of lateral malleolus of right fibula, initial encounter for closed fracture: Secondary | ICD-10-CM | POA: Diagnosis not present

## 2020-10-30 DIAGNOSIS — S92014A Nondisplaced fracture of body of right calcaneus, initial encounter for closed fracture: Secondary | ICD-10-CM | POA: Diagnosis not present

## 2020-10-30 DIAGNOSIS — S92009A Unspecified fracture of unspecified calcaneus, initial encounter for closed fracture: Secondary | ICD-10-CM | POA: Insufficient documentation

## 2020-10-30 DIAGNOSIS — S8263XA Displaced fracture of lateral malleolus of unspecified fibula, initial encounter for closed fracture: Secondary | ICD-10-CM | POA: Insufficient documentation

## 2020-11-09 ENCOUNTER — Other Ambulatory Visit: Payer: Self-pay | Admitting: Family Medicine

## 2020-11-09 DIAGNOSIS — M7731 Calcaneal spur, right foot: Secondary | ICD-10-CM

## 2020-11-10 ENCOUNTER — Encounter (INDEPENDENT_AMBULATORY_CARE_PROVIDER_SITE_OTHER): Payer: PPO | Admitting: Ophthalmology

## 2020-11-10 ENCOUNTER — Other Ambulatory Visit: Payer: Self-pay

## 2020-11-10 DIAGNOSIS — E113513 Type 2 diabetes mellitus with proliferative diabetic retinopathy with macular edema, bilateral: Secondary | ICD-10-CM

## 2020-11-10 DIAGNOSIS — H43813 Vitreous degeneration, bilateral: Secondary | ICD-10-CM | POA: Diagnosis not present

## 2020-11-10 DIAGNOSIS — H35033 Hypertensive retinopathy, bilateral: Secondary | ICD-10-CM

## 2020-11-10 DIAGNOSIS — I10 Essential (primary) hypertension: Secondary | ICD-10-CM

## 2020-11-15 ENCOUNTER — Ambulatory Visit: Payer: PPO | Admitting: Family Medicine

## 2020-11-22 ENCOUNTER — Other Ambulatory Visit: Payer: Self-pay | Admitting: Family Medicine

## 2020-11-22 DIAGNOSIS — M7731 Calcaneal spur, right foot: Secondary | ICD-10-CM

## 2020-12-01 DIAGNOSIS — S92014A Nondisplaced fracture of body of right calcaneus, initial encounter for closed fracture: Secondary | ICD-10-CM | POA: Diagnosis not present

## 2020-12-01 DIAGNOSIS — S8264XA Nondisplaced fracture of lateral malleolus of right fibula, initial encounter for closed fracture: Secondary | ICD-10-CM | POA: Diagnosis not present

## 2020-12-04 ENCOUNTER — Other Ambulatory Visit: Payer: Self-pay | Admitting: Family Medicine

## 2020-12-04 DIAGNOSIS — K219 Gastro-esophageal reflux disease without esophagitis: Secondary | ICD-10-CM

## 2020-12-08 ENCOUNTER — Other Ambulatory Visit: Payer: Self-pay

## 2020-12-08 ENCOUNTER — Encounter (INDEPENDENT_AMBULATORY_CARE_PROVIDER_SITE_OTHER): Payer: PPO | Admitting: Ophthalmology

## 2020-12-08 DIAGNOSIS — E113513 Type 2 diabetes mellitus with proliferative diabetic retinopathy with macular edema, bilateral: Secondary | ICD-10-CM

## 2020-12-08 DIAGNOSIS — H35033 Hypertensive retinopathy, bilateral: Secondary | ICD-10-CM

## 2020-12-08 DIAGNOSIS — H43813 Vitreous degeneration, bilateral: Secondary | ICD-10-CM

## 2020-12-08 DIAGNOSIS — I10 Essential (primary) hypertension: Secondary | ICD-10-CM

## 2020-12-18 ENCOUNTER — Other Ambulatory Visit: Payer: Self-pay | Admitting: Family Medicine

## 2020-12-18 DIAGNOSIS — N401 Enlarged prostate with lower urinary tract symptoms: Secondary | ICD-10-CM

## 2020-12-30 ENCOUNTER — Other Ambulatory Visit: Payer: Self-pay | Admitting: Family Medicine

## 2020-12-30 DIAGNOSIS — E119 Type 2 diabetes mellitus without complications: Secondary | ICD-10-CM

## 2021-01-01 DIAGNOSIS — S92014A Nondisplaced fracture of body of right calcaneus, initial encounter for closed fracture: Secondary | ICD-10-CM | POA: Diagnosis not present

## 2021-01-08 ENCOUNTER — Other Ambulatory Visit: Payer: Self-pay

## 2021-01-08 ENCOUNTER — Encounter (INDEPENDENT_AMBULATORY_CARE_PROVIDER_SITE_OTHER): Payer: PPO | Admitting: Ophthalmology

## 2021-01-08 DIAGNOSIS — I10 Essential (primary) hypertension: Secondary | ICD-10-CM | POA: Diagnosis not present

## 2021-01-08 DIAGNOSIS — E113513 Type 2 diabetes mellitus with proliferative diabetic retinopathy with macular edema, bilateral: Secondary | ICD-10-CM

## 2021-01-08 DIAGNOSIS — H43813 Vitreous degeneration, bilateral: Secondary | ICD-10-CM | POA: Diagnosis not present

## 2021-01-08 DIAGNOSIS — H35033 Hypertensive retinopathy, bilateral: Secondary | ICD-10-CM | POA: Diagnosis not present

## 2021-01-27 ENCOUNTER — Other Ambulatory Visit: Payer: Self-pay | Admitting: Family Medicine

## 2021-01-29 ENCOUNTER — Other Ambulatory Visit: Payer: Self-pay

## 2021-01-29 ENCOUNTER — Encounter: Payer: Self-pay | Admitting: Family Medicine

## 2021-01-29 ENCOUNTER — Ambulatory Visit: Payer: PPO | Admitting: Family Medicine

## 2021-01-29 ENCOUNTER — Ambulatory Visit (INDEPENDENT_AMBULATORY_CARE_PROVIDER_SITE_OTHER): Payer: PPO | Admitting: Family Medicine

## 2021-01-29 VITALS — BP 137/84 | HR 90 | Temp 97.7°F | Ht 65.0 in | Wt 169.2 lb

## 2021-01-29 DIAGNOSIS — N401 Enlarged prostate with lower urinary tract symptoms: Secondary | ICD-10-CM | POA: Diagnosis not present

## 2021-01-29 DIAGNOSIS — Z23 Encounter for immunization: Secondary | ICD-10-CM | POA: Diagnosis not present

## 2021-01-29 DIAGNOSIS — K219 Gastro-esophageal reflux disease without esophagitis: Secondary | ICD-10-CM | POA: Diagnosis not present

## 2021-01-29 DIAGNOSIS — E119 Type 2 diabetes mellitus without complications: Secondary | ICD-10-CM | POA: Diagnosis not present

## 2021-01-29 DIAGNOSIS — S92014A Nondisplaced fracture of body of right calcaneus, initial encounter for closed fracture: Secondary | ICD-10-CM | POA: Diagnosis not present

## 2021-01-29 DIAGNOSIS — I10 Essential (primary) hypertension: Secondary | ICD-10-CM

## 2021-01-29 DIAGNOSIS — E782 Mixed hyperlipidemia: Secondary | ICD-10-CM | POA: Diagnosis not present

## 2021-01-29 DIAGNOSIS — M21371 Foot drop, right foot: Secondary | ICD-10-CM | POA: Insufficient documentation

## 2021-01-29 LAB — BAYER DCA HB A1C WAIVED: HB A1C (BAYER DCA - WAIVED): 6.2 % — ABNORMAL HIGH (ref 4.8–5.6)

## 2021-01-29 MED ORDER — NIFEDIPINE ER 60 MG PO TB24: 60.0000 mg | ORAL_TABLET | Freq: Every day | ORAL | 3 refills | Status: DC

## 2021-01-29 MED ORDER — FINASTERIDE 5 MG PO TABS: 5.0000 mg | ORAL_TABLET | Freq: Every day | ORAL | 3 refills | Status: DC

## 2021-01-29 MED ORDER — OLMESARTAN MEDOXOMIL 40 MG PO TABS: 40.0000 mg | ORAL_TABLET | Freq: Every day | ORAL | 3 refills | Status: DC

## 2021-01-29 MED ORDER — SIMVASTATIN 40 MG PO TABS: 20.0000 mg | ORAL_TABLET | Freq: Every day | ORAL | 3 refills | Status: DC

## 2021-01-29 MED ORDER — PANTOPRAZOLE SODIUM 40 MG PO TBEC: 40.0000 mg | DELAYED_RELEASE_TABLET | Freq: Two times a day (BID) | ORAL | 3 refills | Status: DC

## 2021-01-29 MED ORDER — METFORMIN HCL 1000 MG PO TABS: 1000.0000 mg | ORAL_TABLET | Freq: Every day | ORAL | 3 refills | Status: DC

## 2021-01-29 NOTE — Progress Notes (Signed)
Subjective:  Patient ID: Jared Norman,  male    DOB: 03-07-1941  Age: 80 y.o.    CC: Medical Management of Chronic Issues   HPI Jared Norman presents for  follow-up of hypertension. Patient has no history of headache chest pain or shortness of breath or recent cough. Patient also denies symptoms of TIA such as numbness weakness lateralizing. Patient denies side effects from medication. States taking it regularly.  Patient also  in for follow-up of elevated cholesterol. Doing well without complaints on current medication. Denies side effects  including myalgia and arthralgia and nausea. Also in today for liver function testing. Currently no chest pain, shortness of breath or other cardiovascular related symptoms noted.  Follow-up of diabetes. Patient does check blood sugar at home. Readings run between 100 and 150 Patient denies symptoms such as excessive hunger or urinary frequency, excessive hunger, nausea No significant hypoglycemic spells noted. Medications reviewed. Pt reports taking them regularly. Pt. denies complication/adverse reaction today.    History Jared Norman has a past medical history of Arthritis, BPH (benign prostatic hyperplasia), Cataract, Gallstones, Hyperlipidemia, Hypertension, Kidney stones, Type 2 diabetes mellitus ([place]), and Vitamin D deficiency.   He has a past surgical history that includes Cystoscopy with retrograde pyelogram, ureteroscopy and stent placement (Left, 12/20/2013); Foot surgery (Right, 2008); Laparoscopic cholecystectomy (05/2018); and Cataract extraction, bilateral.   His family history includes ALS in his brother; Bladder Cancer in his father; Blindness in his father and paternal grandfather; Diabetes in his brother; Hypertension in his brother.He reports that he has never smoked. He has never used smokeless tobacco. He reports that he does not drink alcohol and does not use drugs.  Current Outpatient Medications on File Prior to Visit   Medication Sig Dispense Refill   aspirin EC 81 MG tablet Take 81 mg by mouth every Monday, Wednesday, and Friday.     cholecalciferol (VITAMIN D) 1000 UNITS tablet Take 2,000 Units by mouth daily.     glucose blood (ONETOUCH VERIO) test strip Check glucose up to 4 times daily Dx E11.9 400 each 3   magnesium gluconate (MAGONATE) 500 MG tablet Take 1,000 mg by mouth daily.      meloxicam (MOBIC) 15 MG tablet Take 1 tablet by mouth once daily 90 tablet 3   OneTouch [place] Lancets 76E MISC USE   TO CHECK GLUCOSE 4 TIMES DAILY 100 each 0   No current facility-administered medications on file prior to visit.    ROS Review of Systems  Constitutional:  Negative for fever.  Respiratory:  Negative for shortness of breath.   Cardiovascular:  Negative for chest pain.  Musculoskeletal:  Negative for arthralgias.  Skin:  Negative for rash.   Objective:  BP 137/84   Pulse 90   Temp 97.7 F (36.5 C)   Ht _0  (1.651 m)   Wt 169 lb 3.2 oz (76.7 kg)   SpO2 99%   BMI 28.16 kg/m   BP Readings from Last 3 Encounters:  01/29/21 137/84  07/27/20 (!) 160/80  06/15/20 121/70    Wt Readings from Last 3 Encounters:  01/29/21 169 lb 3.2 oz (76.7 kg)  07/27/20 174 lb 12.8 oz (79.3 kg)  06/15/20 157 lb 3.2 oz (71.3 kg)     Physical Exam Vitals reviewed.  Constitutional:      Appearance: He is well-developed.  HENT:     Head: Normocephalic and atraumatic.     Right Ear: External ear normal.     Left Ear: External  ear normal.     Mouth/Throat:     Pharynx: No oropharyngeal exudate or posterior oropharyngeal erythema.  Eyes:     Pupils: Pupils are equal, round, and reactive to light.  Cardiovascular:     Rate and Rhythm: Normal rate and regular rhythm.     Heart sounds: No murmur heard. Pulmonary:     Effort: No respiratory distress.     Breath sounds: Normal breath sounds.  Musculoskeletal:     Cervical back: Normal range of motion and neck supple.  Neurological:     Mental Status:  He is alert and oriented to person, place, and time.    Diabetic Foot Exam - Simple   No data filed       Assessment & Plan:   Jared Norman was seen today for medical management of chronic issues.  Diagnoses and all orders for this visit:  Primary hypertension -     CMP14+EGFR  Diabetes mellitus without complication ([place]) -     Bayer DCA Hb A1c Waived -     CBC with Differential/Platelet -     metFORMIN (GLUCOPHAGE) 1000 MG tablet; Take 1 tablet (1,000 mg total) by mouth daily with breakfast. -     simvastatin (ZOCOR) 40 MG tablet; Take 0.5 tablets (20 mg total) by mouth at bedtime.  Mixed hyperlipidemia -     Lipid panel  Benign prostatic hyperplasia with lower urinary tract symptoms, symptom details unspecified -     finasteride (PROSCAR) 5 MG tablet; Take 1 tablet (5 mg total) by mouth daily.  Gastroesophageal reflux disease without esophagitis -     pantoprazole (PROTONIX) 40 MG tablet; Take 1 tablet (40 mg total) by mouth 2 (two) times daily.  Other orders -     NIFEdipine (ADALAT CC) 60 MG 24 hr tablet; Take 1 tablet (60 mg total) by mouth daily. for blood pressure -     olmesartan (BENICAR) 40 MG tablet; Take 1 tablet (40 mg total) by mouth daily. for blood pressure  I have changed Jared Norman's finasteride, metFORMIN, NIFEdipine, olmesartan, and pantoprazole. I am also having him maintain his magnesium gluconate, cholecalciferol, aspirin EC, OneTouch Verio, OneTouch Delica Lancets 46F, meloxicam, and simvastatin.  Meds ordered this encounter  Medications   finasteride (PROSCAR) 5 MG tablet    Sig: Take 1 tablet (5 mg total) by mouth daily.    Dispense:  90 tablet    Refill:  3   metFORMIN (GLUCOPHAGE) 1000 MG tablet    Sig: Take 1 tablet (1,000 mg total) by mouth daily with breakfast.    Dispense:  90 tablet    Refill:  3   NIFEdipine (ADALAT CC) 60 MG 24 hr tablet    Sig: Take 1 tablet (60 mg total) by mouth daily. for blood pressure    Dispense:  90 tablet     Refill:  3   olmesartan (BENICAR) 40 MG tablet    Sig: Take 1 tablet (40 mg total) by mouth daily. for blood pressure    Dispense:  90 tablet    Refill:  3   pantoprazole (PROTONIX) 40 MG tablet    Sig: Take 1 tablet (40 mg total) by mouth 2 (two) times daily.    Dispense:  180 tablet    Refill:  3   simvastatin (ZOCOR) 40 MG tablet    Sig: Take 0.5 tablets (20 mg total) by mouth at bedtime.    Dispense:  90 tablet    Refill:  3  Follow-up: Return in about 3 months (around 04/30/2021).  Claretta Fraise, M.D.

## 2021-01-30 LAB — CMP14+EGFR
ALT: 17 IU/L (ref 0–44)
AST: 17 IU/L (ref 0–40)
Albumin/Globulin Ratio: 2 (ref 1.2–2.2)
Albumin: 4.7 g/dL — ABNORMAL HIGH (ref 3.6–4.6)
Alkaline Phosphatase: 95 IU/L (ref 44–121)
BUN/Creatinine Ratio: 17 (ref 10–24)
BUN: 26 mg/dL (ref 8–27)
Bilirubin Total: 0.7 mg/dL (ref 0.0–1.2)
CO2: 25 mmol/L (ref 20–29)
Calcium: 9.6 mg/dL (ref 8.6–10.2)
Chloride: 100 mmol/L (ref 96–106)
Creatinine, Ser: 1.53 mg/dL — ABNORMAL HIGH (ref 0.76–1.27)
Globulin, Total: 2.4 g/dL (ref 1.5–4.5)
Glucose: 125 mg/dL — ABNORMAL HIGH (ref 65–99)
Potassium: 4.7 mmol/L (ref 3.5–5.2)
Sodium: 138 mmol/L (ref 134–144)
Total Protein: 7.1 g/dL (ref 6.0–8.5)
eGFR: 45 mL/min/{1.73_m2} — ABNORMAL LOW (ref >59)

## 2021-01-30 LAB — LIPID PANEL
Chol/HDL Ratio: 2.8 ratio (ref 0.0–5.0)
Cholesterol, Total: 157 mg/dL (ref 100–199)
HDL: 56 mg/dL (ref >39)
LDL Chol Calc (NIH): 81 mg/dL (ref 0–99)
Triglycerides: 114 mg/dL (ref 0–149)
VLDL Cholesterol Cal: 20 mg/dL (ref 5–40)

## 2021-01-30 LAB — CBC WITH DIFFERENTIAL/PLATELET
Basophils Absolute: 0.1 10*3/uL (ref 0.0–0.2)
Basos: 1 %
EOS (ABSOLUTE): 0.2 10*3/uL (ref 0.0–0.4)
Eos: 2 %
Hematocrit: 46.7 % (ref 37.5–51.0)
Hemoglobin: 15.6 g/dL (ref 13.0–17.7)
Immature Grans (Abs): 0 10*3/uL (ref 0.0–0.1)
Immature Granulocytes: 0 %
Lymphocytes Absolute: 3.1 10*3/uL (ref 0.7–3.1)
Lymphs: 34 %
MCH: 30.7 pg (ref 26.6–33.0)
MCHC: 33.4 g/dL (ref 31.5–35.7)
MCV: 92 fL (ref 79–97)
Monocytes Absolute: 1 10*3/uL — ABNORMAL HIGH (ref 0.1–0.9)
Monocytes: 11 %
Neutrophils Absolute: 4.7 10*3/uL (ref 1.4–7.0)
Neutrophils: 52 %
Platelets: 253 10*3/uL (ref 150–450)
RBC: 5.08 x10E6/uL (ref 4.14–5.80)
RDW: 12.8 % (ref 11.6–15.4)
WBC: 9.1 10*3/uL (ref 3.4–10.8)

## 2021-01-30 NOTE — Progress Notes (Signed)
Hello Vitor,  Your lab result is normal and/or stable.Some minor variations that are not significant are commonly marked abnormal, but do not represent any medical problem for you.  Best regards, Yareli Carthen, M.D.

## 2021-02-01 DIAGNOSIS — S92014A Nondisplaced fracture of body of right calcaneus, initial encounter for closed fracture: Secondary | ICD-10-CM | POA: Diagnosis not present

## 2021-02-05 ENCOUNTER — Encounter (INDEPENDENT_AMBULATORY_CARE_PROVIDER_SITE_OTHER): Payer: PPO | Admitting: Ophthalmology

## 2021-02-05 ENCOUNTER — Other Ambulatory Visit: Payer: Self-pay

## 2021-02-05 DIAGNOSIS — I10 Essential (primary) hypertension: Secondary | ICD-10-CM | POA: Diagnosis not present

## 2021-02-05 DIAGNOSIS — H43813 Vitreous degeneration, bilateral: Secondary | ICD-10-CM

## 2021-02-05 DIAGNOSIS — E113513 Type 2 diabetes mellitus with proliferative diabetic retinopathy with macular edema, bilateral: Secondary | ICD-10-CM | POA: Diagnosis not present

## 2021-02-05 DIAGNOSIS — H35033 Hypertensive retinopathy, bilateral: Secondary | ICD-10-CM

## 2021-02-13 ENCOUNTER — Ambulatory Visit (INDEPENDENT_AMBULATORY_CARE_PROVIDER_SITE_OTHER): Payer: PPO

## 2021-02-13 VITALS — Ht 65.0 in | Wt 169.0 lb

## 2021-02-13 DIAGNOSIS — Z0000 Encounter for general adult medical examination without abnormal findings: Secondary | ICD-10-CM | POA: Diagnosis not present

## 2021-02-13 NOTE — Progress Notes (Signed)
Subjective:   Jared Norman is a 80 y.o. male who presents for Medicare Annual/Subsequent preventive examination.  Virtual Visit via Telephone Note  I connected with  Jared Norman on 02/13/21 at  8:15 AM EDT by telephone and verified that I am speaking with the correct person using two identifiers.  Location: Patient: Home Provider: [facility] Persons participating in the virtual visit: patient/Nurse Health Advisor   I discussed the limitations, risks, security and privacy concerns of performing an evaluation and management service by telephone and the availability of in person appointments. The patient expressed understanding and agreed to proceed.  Interactive audio and video telecommunications were attempted between this nurse and patient, however failed, due to patient having technical difficulties OR patient did not have access to video capability.  We continued and completed visit with audio only.  Some vital signs may be absent or patient reported.   Jared Norman E Alicia Seib, LPN   Review of Systems     Cardiac Risk Factors include: advanced age (>23men, >18 women);diabetes mellitus;dyslipidemia;male gender;hypertension     Objective:    Today's Vitals   02/13/21 0826 02/13/21 0827  Weight: 169 lb (76.7 kg)   Height: 5\' 5"  (1.651 m)   PainSc:  2    Body mass index is 28.12 kg/m.  Advanced Directives 02/13/2021 02/11/2020 02/10/2019 01/22/2019 01/21/2018 07/24/2015 12/27/2013  Does Patient Have a Medical Advance Directive? No No No No No No No  Would patient like information on creating a medical advance directive? No - Patient declined No - Patient declined No - Patient declined No - Patient declined Yes (MAU/Ambulatory/Procedural Areas - Information given) - No - patient declined information  Pre-existing out of facility DNR order (yellow form or pink MOST form) - - - - - - -    Current Medications (verified) Outpatient Encounter Medications as of 02/13/2021  Medication Sig    aspirin EC 81 MG tablet Take 81 mg by mouth every Monday, Wednesday, and Friday.   cholecalciferol (VITAMIN D) 1000 UNITS tablet Take 2,000 Units by mouth daily.   finasteride (PROSCAR) 5 MG tablet Take 1 tablet (5 mg total) by mouth daily.   glucose blood (ONETOUCH VERIO) test strip Check glucose up to 4 times daily Dx E11.9   magnesium gluconate (MAGONATE) 500 MG tablet Take 1,000 mg by mouth daily.    meloxicam (MOBIC) 15 MG tablet Take 1 tablet by mouth once daily   metFORMIN (GLUCOPHAGE) 1000 MG tablet Take 1 tablet (1,000 mg total) by mouth daily with breakfast.   NIFEdipine (ADALAT CC) 60 MG 24 hr tablet Take 1 tablet (60 mg total) by mouth daily. for blood pressure   olmesartan (BENICAR) 40 MG tablet Take 1 tablet (40 mg total) by mouth daily. for blood pressure   OneTouch Delica Lancets 43X MISC USE   TO CHECK GLUCOSE 4 TIMES DAILY   pantoprazole (PROTONIX) 40 MG tablet Take 1 tablet (40 mg total) by mouth 2 (two) times daily.   simvastatin (ZOCOR) 40 MG tablet Take 0.5 tablets (20 mg total) by mouth at bedtime.   No facility-administered encounter medications on file as of 02/13/2021.    Allergies (verified) Patient has no known allergies.   History: Past Medical History:  Diagnosis Date   Arthritis    BPH (benign prostatic hyperplasia)    Cataract    Gallstones    Hyperlipidemia    Hypertension    Kidney stones    Type 2 diabetes mellitus (HCC)    Vitamin  D deficiency    Past Surgical History:  Procedure Laterality Date   CATARACT EXTRACTION, BILATERAL     CYSTOSCOPY WITH RETROGRADE PYELOGRAM, URETEROSCOPY AND STENT PLACEMENT Left 12/20/2013   Procedure: CYSTOSCOPY WITHLEFT RETROGRADE PYELOGRAM,  AND LEFT STENT PLACEMENT;  Surgeon: Malka So, MD;  Location: [facility];  Service: Urology;  Laterality: Left;   FOOT SURGERY Right 2008   Tendon rupture   LAPAROSCOPIC CHOLECYSTECTOMY  05/2018   Dr. Ladona Horns   Family History  Problem Relation Age of Onset   ALS Brother         Agent Orange   Hypertension Brother    Diabetes Brother    Bladder Cancer Father    Blindness Father    Blindness Paternal Grandfather    Social History   Socioeconomic History   Marital status: Married    Spouse name: Marlowe Kays   Number of children: 7   Years of education: 12th grade   Highest education level: High school graduate  Occupational History   Occupation: Company secretary   Occupation: Architectural technologist   Occupation: retired  Tobacco Use   Smoking status: Never   Smokeless tobacco: Never  Scientific laboratory technician Use: Never used  Substance and Sexual Activity   Alcohol use: No   Drug use: No   Sexual activity: Not Currently  Other Topics Concern   Not on file  Social History Narrative   Lives home with his wife   Social Determinants of Health   Financial Resource Strain: Low Risk    Difficulty of Paying Living Expenses: Not hard at all  Food Insecurity: No Food Insecurity   Worried About Charity fundraiser in the Last Year: Never true   Arboriculturist in the Last Year: Never true  Transportation Needs: No Transportation Needs   Lack of Transportation (Medical): No   Lack of Transportation (Non-Medical): No  Physical Activity: Sufficiently Active   Days of Exercise per Week: 7 days   Minutes of Exercise per Session: 30 min  Stress: No Stress Concern Present   Feeling of Stress : Not at all  Social Connections: Socially Integrated   Frequency of Communication with Friends and Family: More than three times a week   Frequency of Social Gatherings with Friends and Family: More than three times a week   Attends Religious Services: More than 4 times per year   Active Member of [employer] or Organizations: Yes   Attends Music therapist: More than 4 times per year   Marital Status: Married    Tobacco Counseling Counseling given: Not Answered   Clinical Intake:  Pre-visit preparation completed: Yes  Pain : 0-10 Pain Score: 2  Pain Type: Chronic pain Pain  Location: Generalized Pain Descriptors / Indicators: Aching, Discomfort Pain Onset: More than a month ago Pain Frequency: Intermittent     BMI - recorded: 28.12 Nutritional Status: BMI 25 -29 Overweight Nutritional Risks: None Diabetes: Yes CBG done?: No Did pt. bring in CBG monitor from home?: No  How often do you need to have someone help you when you read instructions, pamphlets, or other written materials from your doctor or pharmacy?: 1 - Never  Diabetic? Nutrition Risk Assessment:  Has the patient had any N/V/D within the last 2 months?  No  Does the patient have any non-healing wounds?  No  Has the patient had any unintentional weight loss or weight gain?  No   Diabetes:  Is the patient diabetic?  Yes  If diabetic, was a CBG obtained today?  No  Did the patient bring in their glucometer from home?  No  How often do you monitor your CBG's? Once daily fasting - 134 this am per patient.   Financial Strains and Diabetes Management:  Are you having any financial strains with the device, your supplies or your medication? No .  Does the patient want to be seen by Chronic Care Management for management of their diabetes?  No  Would the patient like to be referred to a Nutritionist or for Diabetic Management?  No   Diabetic Exams:  Diabetic Eye Exam: Completed 05/23/2020. Overdue for diabetic eye exam. Pt has been advised about the importance in completing this exam.   Diabetic Foot Exam: Completed 02/16/2020. Pt has been advised about the importance in completing this exam. Pt is scheduled for diabetic foot exam on 05/01/2021.    Interpreter Needed?: No  Information entered by :: Dajour Pierpoint, LPN   Activities of Daily Living In your present state of health, do you have any difficulty performing the following activities: 02/13/2021  Hearing? Y  Comment wears hearing aids  Vision? N  Difficulty concentrating or making decisions? N  Walking or climbing stairs? Y   Dressing or bathing? N  Doing errands, shopping? N  Preparing Food and eating ? N  Using the Toilet? N  In the past six months, have you accidently leaked urine? N  Do you have problems with loss of bowel control? N  Managing your Medications? N  Managing your Finances? N  Housekeeping or managing your Housekeeping? N  Some recent data might be hidden    Patient Care Team: Claretta Fraise, MD as PCP - General (Family Medicine) Satira Sark, MD as PCP - Cardiology (Cardiology) Gala Romney Cristopher Estimable, MD as Consulting Physician (Gastroenterology) Hayden Pedro, MD as Consulting Physician (Ophthalmology)  Indicate any recent Medical Services you may have received from other than [facility] providers in the past year (date may be approximate).     Assessment:   This is a routine wellness examination for Jared Norman.  Hearing/Vision screen Hearing Screening - Comments:: Wears hearing aids - from [facility] in [place] - Comments:: Wears rx glasses - up to date with annual eye exams with Dr Zigmund Daniel in [place]  Dietary issues and exercise activities discussed: Current Exercise Habits: Structured exercise class;Home exercise routine, Type of exercise: strength training/weights;stretching;Other - see comments (PT twice per week, exercises on his own other 5 days), Time (Minutes): 30, Frequency (Times/Week): 7, Weekly Exercise (Minutes/Week): 210, Intensity: Moderate, Exercise limited by: orthopedic condition(s)   Goals Addressed             This Visit's Progress    Prevent falls   On track      Depression Screen PHQ 2/9 Scores 02/13/2021 01/29/2021 01/29/2021 07/27/2020 06/15/2020 05/19/2020 04/27/2020  PHQ - 2 Score 0 0 0 0 0 0 0    Fall Risk Fall Risk  02/13/2021 01/29/2021 01/29/2021 07/27/2020 06/15/2020  Falls in the past year? 1 1 0 1 1  Comment - - - - -  Number falls in past yr: 0 0 - 0 0  Injury with Fall? 0 0 - 0 0  Risk Factor Category  - - - - -  Risk for  fall due to : History of fall(s);Impaired balance/gait;Orthopedic patient Orthopedic patient;History of fall(s) - History of fall(s) History of fall(s)  Risk for fall due to: Comment - - - - -  Follow up Education provided;Falls prevention discussed Falls evaluation completed - Falls evaluation completed Falls evaluation completed  Comment - - - - -    FALL RISK PREVENTION PERTAINING TO THE HOME:  Any stairs in or around the home? Yes  If so, are there any without handrails? Yes  Home free of loose throw rugs in walkways, pet beds, electrical cords, etc? Yes  Adequate lighting in your home to reduce risk of falls? Yes   ASSISTIVE DEVICES UTILIZED TO PREVENT FALLS:  Life alert? No  Use of a cane, walker or w/c? Yes  Grab bars in the bathroom? No  Shower chair or bench in shower? Yes  Elevated toilet seat or a handicapped toilet? No   TIMED UP AND GO:  Was the test performed? No . Telephonic visit  Cognitive Function: Normal cognitive status assessed by direct observation by this Nurse Health Advisor. No abnormalities found.   MMSE - Mini Mental State Exam 01/21/2018  Orientation to time 5  Orientation to Place 5  Registration 3  Attention/ Calculation 5  Recall 3  Language- name 2 objects 2  Language- repeat 1  Language- follow 3 step command 3  Language- read & follow direction 1  Write a sentence 1  Copy design 1  Total score 30     6CIT Screen 02/11/2020 02/10/2019  What Year? 0 points 0 points  What month? 0 points 0 points  What time? 0 points 0 points  Count back from 20 0 points 0 points  Months in reverse 0 points 0 points  Repeat phrase 0 points 0 points  Total Score 0 0    Immunizations Immunization History  Administered Date(s) Administered   Fluad Quad(high Dose 65+) 02/16/2019, 02/16/2020, 01/29/2021   Influenza, High Dose Seasonal PF 01/21/2018   Influenza,inj,Quad PF,6+ Mos 02/26/2013, 04/19/2014, 02/20/2015, 02/15/2016, 02/07/2017   Janssen (J&J)  SARS-COV-2 Vaccination 08/12/2019   Pneumococcal Conjugate-13 11/18/2014   Pneumococcal Polysaccharide-23 05/13/2010   Tdap 12/07/2013   Zoster Recombinat (Shingrix) 01/21/2018, 05/09/2018    TDAP status: Up to date  Flu Vaccine status: Up to date  Pneumococcal vaccine status: Up to date  Covid-19 vaccine status: Completed vaccines  Qualifies for Shingles Vaccine? Yes   Zostavax completed Yes   Shingrix Completed?: Yes  Screening Tests Health Maintenance  Topic Date Due   COVID-19 Vaccine (2 - Booster for Janssen series) 10/07/2019   FOOT EXAM  02/15/2021   OPHTHALMOLOGY EXAM  05/23/2021   HEMOGLOBIN A1C  07/29/2021   TETANUS/TDAP  12/08/2023   INFLUENZA VACCINE  Completed   Zoster Vaccines- Shingrix  Completed   HPV VACCINES  Aged Out    Health Maintenance  Health Maintenance Due  Topic Date Due   COVID-19 Vaccine (2 - Booster for Janssen series) 10/07/2019    Colorectal cancer screening: No longer required.   Lung Cancer Screening: (Low Dose CT Chest recommended if Age 76-80 years, 30 pack-year currently smoking OR have quit w/in 15years.) does not qualify  Additional Screening:  Hepatitis C Screening: does not qualify  Vision Screening: Recommended annual ophthalmology exams for early detection of glaucoma and other disorders of the eye. Is the patient up to date with their annual eye exam?  Yes  Who is the provider or what is the name of the office in which the patient attends annual eye exams? [facility] If pt is not established with a provider, would they like to be referred to a provider to establish care? No .   Dental  Screening: Recommended annual dental exams for proper oral hygiene  Community Resource Referral / Chronic Care Management: CRR required this visit?  No   CCM required this visit?  No      Plan:     I have personally reviewed and noted the following in the patient's chart:   Medical and social history Use of alcohol, tobacco or  illicit drugs  Current medications and supplements including opioid prescriptions. Patient is not currently taking opioid prescriptions. Functional ability and status Nutritional status Physical activity Advanced directives List of other physicians Hospitalizations, surgeries, and ER visits in previous 12 months Vitals Screenings to include cognitive, depression, and falls Referrals and appointments  In addition, I have reviewed and discussed with patient certain preventive protocols, quality metrics, and best practice recommendations. A written personalized care plan for preventive services as well as general preventive health recommendations were provided to patient.     Sandrea Hammond, LPN   28/11/6809   Nurse Notes: None

## 2021-02-13 NOTE — Patient Instructions (Signed)
Jared Norman , Thank you for taking time to come for your Medicare Wellness Visit. I appreciate your ongoing commitment to your health goals. Please review the following plan we discussed and let me know if I can assist you in the future.   Screening recommendations/referrals: Colonoscopy: No longer required Recommended yearly ophthalmology/optometry visit for glaucoma screening and checkup Recommended yearly dental visit for hygiene and checkup  Vaccinations: Influenza vaccine: Done 01/29/2021 - Repeat annually Pneumococcal vaccine: Done 05/13/2010 & 11/18/2014 Tdap vaccine: Done 12/07/2013 - Repeat in 10 years Shingles vaccine: Done 01/21/2018 & 05/09/2018   Covid-19: Jared Norman done 08/12/2019 - had boosters, we need dates please  Advanced directives: Advance directive discussed with you today. Even though you declined this today, please call our office should you change your mind, and we can give you the proper paperwork for you to fill out.   Conditions/risks identified: Aim for 30 minutes of exercise or brisk walking each day, drink 6-8 glasses of water and eat lots of fruits and vegetables.   Next appointment: Follow up in one year for your annual wellness visit.   Preventive Care 81 Years and Older, Male  Preventive care refers to lifestyle choices and visits with your health care provider that can promote health and wellness. What does preventive care include? A yearly physical exam. This is also called an annual well check. Dental exams once or twice a year. Routine eye exams. Ask your health care provider how often you should have your eyes checked. Personal lifestyle choices, including: Daily care of your teeth and gums. Regular physical activity. Eating a healthy diet. Avoiding tobacco and drug use. Limiting alcohol use. Practicing safe sex. Taking low doses of aspirin every day. Taking vitamin and mineral supplements as recommended by your health care provider. What happens during  an annual well check? The services and screenings done by your health care provider during your annual well check will depend on your age, Norman health, lifestyle risk factors, and family history of disease. Counseling  Your health care provider may ask you questions about your: Alcohol use. Tobacco use. Drug use. Emotional well-being. Home and relationship well-being. Sexual activity. Eating habits. History of falls. Memory and ability to understand (cognition). Work and work Statistician. Screening  You may have the following tests or measurements: Height, weight, and BMI. Blood pressure. Lipid and cholesterol levels. These may be checked every 5 years, or more frequently if you are over 75 years old. Skin check. Lung cancer screening. You may have this screening every year starting at age 54 if you have a 30-pack-year history of smoking and currently smoke or have quit within the past 15 years. Fecal occult blood test (FOBT) of the stool. You may have this test every year starting at age 20. Flexible sigmoidoscopy or colonoscopy. You may have a sigmoidoscopy every 5 years or a colonoscopy every 10 years starting at age 84. Prostate cancer screening. Recommendations will vary depending on your family history and other risks. Hepatitis C blood test. Hepatitis B blood test. Sexually transmitted disease (STD) testing. Diabetes screening. This is done by checking your blood sugar (glucose) after you have not eaten for a while (fasting). You may have this done every 1-3 years. Abdominal aortic aneurysm (AAA) screening. You may need this if you are a current or former smoker. Osteoporosis. You may be screened starting at age 20 if you are at high risk. Talk with your health care provider about your test results, treatment options, and if necessary,  the need for more tests. Vaccines  Your health care provider may recommend certain vaccines, such as: Influenza vaccine. This is recommended  every year. Tetanus, diphtheria, and acellular pertussis (Tdap, Td) vaccine. You may need a Td booster every 10 years. Zoster vaccine. You may need this after age 4. Pneumococcal 13-valent conjugate (PCV13) vaccine. One dose is recommended after age 41. Pneumococcal polysaccharide (PPSV23) vaccine. One dose is recommended after age 34. Talk to your health care provider about which screenings and vaccines you need and how often you need them. This information is not intended to replace advice given to you by your health care provider. Make sure you discuss any questions you have with your health care provider. Document Released: 05/26/2015 Document Revised: 01/17/2016 Document Reviewed: 02/28/2015 Elsevier Interactive Patient Education  2017 [place] Prevention in the Home Falls can cause injuries. They can happen to people of all ages. There are many things you can do to make your home safe and to help prevent falls. What can I do on the outside of my home? Regularly fix the edges of walkways and driveways and fix any cracks. Remove anything that might make you trip as you walk through a door, such as a raised step or threshold. Trim any bushes or trees on the path to your home. Use bright outdoor lighting. Clear any walking paths of anything that might make someone trip, such as rocks or tools. Regularly check to see if handrails are loose or broken. Make sure that both sides of any steps have handrails. Any raised decks and porches should have guardrails on the edges. Have any leaves, snow, or ice cleared regularly. Use sand or salt on walking paths during winter. Clean up any spills in your garage right away. This includes oil or grease spills. What can I do in the bathroom? Use night lights. Install grab bars by the toilet and in the tub and shower. Do not use towel bars as grab bars. Use non-skid mats or decals in the tub or shower. If you need to sit down in the shower,  use a plastic, non-slip stool. Keep the floor dry. Clean up any water that spills on the floor as soon as it happens. Remove soap buildup in the tub or shower regularly. Attach bath mats securely with double-sided non-slip rug tape. Do not have throw rugs and other things on the floor that can make you trip. What can I do in the bedroom? Use night lights. Make sure that you have a light by your bed that is easy to reach. Do not use any sheets or blankets that are too big for your bed. They should not hang down onto the floor. Have a firm chair that has side arms. You can use this for support while you get dressed. Do not have throw rugs and other things on the floor that can make you trip. What can I do in the kitchen? Clean up any spills right away. Avoid walking on wet floors. Keep items that you use a lot in easy-to-reach places. If you need to reach something above you, use a strong step stool that has a grab bar. Keep electrical cords out of the way. Do not use floor polish or wax that makes floors slippery. If you must use wax, use non-skid floor wax. Do not have throw rugs and other things on the floor that can make you trip. What can I do with my stairs? Do not leave any items on  the stairs. Make sure that there are handrails on both sides of the stairs and use them. Fix handrails that are broken or loose. Make sure that handrails are as long as the stairways. Check any carpeting to make sure that it is firmly attached to the stairs. Fix any carpet that is loose or worn. Avoid having throw rugs at the top or bottom of the stairs. If you do have throw rugs, attach them to the floor with carpet tape. Make sure that you have a light switch at the top of the stairs and the bottom of the stairs. If you do not have them, ask someone to add them for you. What else can I do to help prevent falls? Wear shoes that: Do not have high heels. Have rubber bottoms. Are comfortable and fit you  well. Are closed at the toe. Do not wear sandals. If you use a stepladder: Make sure that it is fully opened. Do not climb a closed stepladder. Make sure that both sides of the stepladder are locked into place. Ask someone to hold it for you, if possible. Clearly mark and make sure that you can see: Any grab bars or handrails. First and last steps. Where the edge of each step is. Use tools that help you move around (mobility aids) if they are needed. These include: Canes. Walkers. Scooters. Crutches. Turn on the lights when you go into a dark area. Replace any light bulbs as soon as they burn out. Set up your furniture so you have a clear path. Avoid moving your furniture around. If any of your floors are uneven, fix them. If there are any pets around you, be aware of where they are. Review your medicines with your doctor. Some medicines can make you feel dizzy. This can increase your chance of falling. Ask your doctor what other things that you can do to help prevent falls. This information is not intended to replace advice given to you by your health care provider. Make sure you discuss any questions you have with your health care provider. Document Released: 02/23/2009 Document Revised: 10/05/2015 Document Reviewed: 06/03/2014 Elsevier Interactive Patient Education  2017 [employer].

## 2021-02-20 DIAGNOSIS — C4441 Basal cell carcinoma of skin of scalp and neck: Secondary | ICD-10-CM | POA: Diagnosis not present

## 2021-02-20 DIAGNOSIS — L570 Actinic keratosis: Secondary | ICD-10-CM | POA: Diagnosis not present

## 2021-02-28 DIAGNOSIS — M79671 Pain in right foot: Secondary | ICD-10-CM | POA: Diagnosis not present

## 2021-02-28 DIAGNOSIS — S92061D Displaced intraarticular fracture of right calcaneus, subsequent encounter for fracture with routine healing: Secondary | ICD-10-CM | POA: Diagnosis not present

## 2021-03-05 ENCOUNTER — Encounter (INDEPENDENT_AMBULATORY_CARE_PROVIDER_SITE_OTHER): Payer: PPO | Admitting: Ophthalmology

## 2021-03-06 ENCOUNTER — Encounter (INDEPENDENT_AMBULATORY_CARE_PROVIDER_SITE_OTHER): Payer: PPO | Admitting: Ophthalmology

## 2021-03-06 ENCOUNTER — Other Ambulatory Visit: Payer: Self-pay

## 2021-03-06 DIAGNOSIS — E113513 Type 2 diabetes mellitus with proliferative diabetic retinopathy with macular edema, bilateral: Secondary | ICD-10-CM

## 2021-03-06 DIAGNOSIS — H43813 Vitreous degeneration, bilateral: Secondary | ICD-10-CM

## 2021-03-06 DIAGNOSIS — H35033 Hypertensive retinopathy, bilateral: Secondary | ICD-10-CM | POA: Diagnosis not present

## 2021-03-06 DIAGNOSIS — I10 Essential (primary) hypertension: Secondary | ICD-10-CM

## 2021-03-16 ENCOUNTER — Encounter: Payer: Self-pay | Admitting: *Deleted

## 2021-04-03 ENCOUNTER — Other Ambulatory Visit: Payer: Self-pay

## 2021-04-03 ENCOUNTER — Encounter (INDEPENDENT_AMBULATORY_CARE_PROVIDER_SITE_OTHER): Payer: PPO | Admitting: Ophthalmology

## 2021-04-03 DIAGNOSIS — H43813 Vitreous degeneration, bilateral: Secondary | ICD-10-CM

## 2021-04-03 DIAGNOSIS — I10 Essential (primary) hypertension: Secondary | ICD-10-CM

## 2021-04-03 DIAGNOSIS — H35033 Hypertensive retinopathy, bilateral: Secondary | ICD-10-CM | POA: Diagnosis not present

## 2021-04-03 DIAGNOSIS — E113513 Type 2 diabetes mellitus with proliferative diabetic retinopathy with macular edema, bilateral: Secondary | ICD-10-CM

## 2021-04-27 ENCOUNTER — Ambulatory Visit (INDEPENDENT_AMBULATORY_CARE_PROVIDER_SITE_OTHER): Payer: PPO

## 2021-04-27 ENCOUNTER — Encounter: Payer: Self-pay | Admitting: Family Medicine

## 2021-04-27 ENCOUNTER — Ambulatory Visit (INDEPENDENT_AMBULATORY_CARE_PROVIDER_SITE_OTHER): Payer: PPO | Admitting: Family Medicine

## 2021-04-27 ENCOUNTER — Other Ambulatory Visit: Payer: Self-pay | Admitting: Family Medicine

## 2021-04-27 VITALS — BP 138/76 | HR 69 | Temp 97.1°F | Ht 65.0 in | Wt 176.0 lb

## 2021-04-27 DIAGNOSIS — M79674 Pain in right toe(s): Secondary | ICD-10-CM | POA: Diagnosis not present

## 2021-04-27 DIAGNOSIS — L929 Granulomatous disorder of the skin and subcutaneous tissue, unspecified: Secondary | ICD-10-CM

## 2021-04-27 DIAGNOSIS — L03031 Cellulitis of right toe: Secondary | ICD-10-CM | POA: Diagnosis not present

## 2021-04-27 DIAGNOSIS — S67196A Crushing injury of right little finger, initial encounter: Secondary | ICD-10-CM | POA: Diagnosis not present

## 2021-04-27 DIAGNOSIS — M7989 Other specified soft tissue disorders: Secondary | ICD-10-CM

## 2021-04-27 DIAGNOSIS — N401 Enlarged prostate with lower urinary tract symptoms: Secondary | ICD-10-CM | POA: Diagnosis not present

## 2021-04-27 DIAGNOSIS — E1142 Type 2 diabetes mellitus with diabetic polyneuropathy: Secondary | ICD-10-CM

## 2021-04-27 MED ORDER — NIFEDIPINE ER 90 MG PO TB24: 90.0000 mg | ORAL_TABLET | Freq: Every day | ORAL | 1 refills | Status: DC

## 2021-04-27 MED ORDER — CIPROFLOXACIN HCL 500 MG PO TABS: 500.0000 mg | ORAL_TABLET | Freq: Two times a day (BID) | ORAL | 0 refills | Status: DC

## 2021-04-27 NOTE — Progress Notes (Signed)
Subjective:  Patient ID: Jared Norman,  male    DOB: Mar 16, 1941  Age: 80 y.o.    CC: Toe Pain and Hand Pain   HPI DIANNE BADY presents for  follow-up of hypertension. Patient has no history of headache chest pain or shortness of breath or recent cough. Patient also denies symptoms of TIA such as numbness weakness lateralizing. Patient denies side effects from medication. States taking it regularly.  Patient also  in for follow-up of elevated cholesterol. Doing well without complaints on current medication. Denies side effects  including myalgia and arthralgia and nausea. Also in today for liver function testing. Currently no chest pain, shortness of breath or other cardiovascular related symptoms noted.  Follow-up of diabetes. Patient does check blood sugar at home. Readings run 110-130 fasting Patient denies symptoms such as excessive hunger or urinary frequency, excessive hunger, nausea No significant hypoglycemic spells noted. Medications reviewed. Pt reports taking them regularly. Pt. denies complication/adverse reaction today.    History Azlan has a past medical history of Arthritis, BPH (benign prostatic hyperplasia), Cataract, Gallstones, Hyperlipidemia, Hypertension, Kidney stones, Type 2 diabetes mellitus ([place]), and Vitamin D deficiency.   He has a past surgical history that includes Cystoscopy with retrograde pyelogram, ureteroscopy and stent placement (Left, 12/20/2013); Foot surgery (Right, 2008); Laparoscopic cholecystectomy (05/2018); and Cataract extraction, bilateral.   His family history includes ALS in his brother; Bladder Cancer in his father; Blindness in his father and paternal grandfather; Diabetes in his brother; Hypertension in his brother.He reports that he has never smoked. He has never used smokeless tobacco. He reports that he does not drink alcohol and does not use drugs.  Current Outpatient Medications on File Prior to Visit  Medication Sig Dispense  Refill   aspirin EC 81 MG tablet Take 81 mg by mouth every Monday, Wednesday, and Friday.     cholecalciferol (VITAMIN D) 1000 UNITS tablet Take 2,000 Units by mouth daily.     finasteride (PROSCAR) 5 MG tablet Take 1 tablet (5 mg total) by mouth daily. 90 tablet 3   glucose blood (ONETOUCH VERIO) test strip Check glucose up to 4 times daily Dx E11.9 400 each 3   magnesium gluconate (MAGONATE) 500 MG tablet Take 1,000 mg by mouth daily.      meloxicam (MOBIC) 15 MG tablet Take 1 tablet by mouth once daily 90 tablet 3   metFORMIN (GLUCOPHAGE) 1000 MG tablet Take 1 tablet (1,000 mg total) by mouth daily with breakfast. 90 tablet 3   olmesartan (BENICAR) 40 MG tablet Take 1 tablet (40 mg total) by mouth daily. for blood pressure 90 tablet 3   OneTouch Delica Lancets 83M MISC USE   TO CHECK GLUCOSE 4 TIMES DAILY 100 each 0   pantoprazole (PROTONIX) 40 MG tablet Take 1 tablet (40 mg total) by mouth 2 (two) times daily. 180 tablet 3   simvastatin (ZOCOR) 40 MG tablet Take 0.5 tablets (20 mg total) by mouth at bedtime. 90 tablet 3   No current facility-administered medications on file prior to visit.    ROS Review of Systems  Constitutional:  Negative for fever.  Respiratory:  Negative for shortness of breath.   Cardiovascular:  Negative for chest pain.  Endocrine: Negative.   Musculoskeletal:  Negative for arthralgias.       Finger tips stay cold   Skin:  Negative for rash.   Objective:  BP 138/76    Pulse 69    Temp (!) 97.1 F (36.2 C) (Temporal)  Ht '5\' 5"'  (1.651 m)    Wt 176 lb (79.8 kg)    SpO2 100%    BMI 29.29 kg/m   BP Readings from Last 3 Encounters:  04/27/21 138/76  01/29/21 137/84  07/27/20 (!) 160/80    Wt Readings from Last 3 Encounters:  04/27/21 176 lb (79.8 kg)  02/13/21 169 lb (76.7 kg)  01/29/21 169 lb 3.2 oz (76.7 kg)     Physical Exam Constitutional:      General: He is not in acute distress.    Appearance: He is well-developed.  HENT:     Head:  Normocephalic and atraumatic.     Right Ear: External ear normal.     Left Ear: External ear normal.     Nose: Nose normal.  Eyes:     Conjunctiva/sclera: Conjunctivae normal.     Pupils: Pupils are equal, round, and reactive to light.  Cardiovascular:     Rate and Rhythm: Normal rate and regular rhythm.     Heart sounds: Normal heart sounds. No murmur heard. Pulmonary:     Effort: Pulmonary effort is normal. No respiratory distress.     Breath sounds: Normal breath sounds. No wheezing or rales.  Abdominal:     Palpations: Abdomen is soft.     Tenderness: There is no abdominal tenderness.  Musculoskeletal:        General: Normal range of motion.     Cervical back: Normal range of motion and neck supple.  Skin:    General: Skin is warm and dry.     Findings: Lesion (right great toe has 1 cm X 4 mmm granuloma medial to nail. OOzing with milky exudate.) present.  Neurological:     Mental Status: He is alert and oriented to person, place, and time.     Deep Tendon Reflexes: Reflexes are normal and symmetric.  Psychiatric:        Behavior: Behavior normal.        Thought Content: Thought content normal.        Judgment: Judgment normal.    Diabetic Foot Exam - Simple   No data filed       Assessment & Plan:   Slayton was seen today for toe pain and hand pain.  Diagnoses and all orders for this visit:  Granuloma of great toe -     Ambulatory referral to Podiatry -     CBC with Differential/Platelet -     CMP14+EGFR  Benign prostatic hyperplasia with lower urinary tract symptoms, symptom details unspecified -     CBC with Differential/Platelet -     CMP14+EGFR -     PSA, total and free  Diabetic polyneuropathy associated with type 2 diabetes mellitus ([place]) -     CBC with Differential/Platelet -     CMP14+EGFR -     Bayer DCA Hb A1c Waived -     Lipid panel  Swollen finger -     CBC with Differential/Platelet -     CMP14+EGFR -     Cancel: DG Finger Index Right;  Future  Other orders -     ciprofloxacin (CIPRO) 500 MG tablet; Take 1 tablet (500 mg total) by mouth 2 (two) times daily. -     NIFEdipine (ADALAT CC) 90 MG 24 hr tablet; Take 1 tablet (90 mg total) by mouth daily. for blood pressure  I have changed Anthonymichael L. Welling's NIFEdipine. I am also having him start on ciprofloxacin. Additionally, I am having him maintain  his magnesium gluconate, cholecalciferol, aspirin EC, OneTouch Verio, OneTouch Delica Lancets 88E, meloxicam, finasteride, metFORMIN, olmesartan, pantoprazole, and simvastatin.  Meds ordered this encounter  Medications   ciprofloxacin (CIPRO) 500 MG tablet    Sig: Take 1 tablet (500 mg total) by mouth 2 (two) times daily.    Dispense:  20 tablet    Refill:  0   NIFEdipine (ADALAT CC) 90 MG 24 hr tablet    Sig: Take 1 tablet (90 mg total) by mouth daily. for blood pressure    Dispense:  90 tablet    Refill:  1     Follow-up: Return in about 3 months (around 07/26/2021).  Claretta Fraise, M.D.

## 2021-04-30 ENCOUNTER — Other Ambulatory Visit: Payer: Self-pay | Admitting: *Deleted

## 2021-04-30 ENCOUNTER — Other Ambulatory Visit: Payer: PPO

## 2021-04-30 ENCOUNTER — Telehealth: Payer: Self-pay | Admitting: Family Medicine

## 2021-04-30 DIAGNOSIS — E119 Type 2 diabetes mellitus without complications: Secondary | ICD-10-CM

## 2021-04-30 DIAGNOSIS — I10 Essential (primary) hypertension: Secondary | ICD-10-CM

## 2021-04-30 DIAGNOSIS — E782 Mixed hyperlipidemia: Secondary | ICD-10-CM | POA: Diagnosis not present

## 2021-04-30 DIAGNOSIS — Z125 Encounter for screening for malignant neoplasm of prostate: Secondary | ICD-10-CM

## 2021-04-30 LAB — BAYER DCA HB A1C WAIVED: HB A1C (BAYER DCA - WAIVED): 6.6 % — ABNORMAL HIGH (ref 4.8–5.6)

## 2021-04-30 NOTE — Telephone Encounter (Signed)
No need to come in on 12/29. Finger xr showed arthritis. No fracture or other acute injury.

## 2021-04-30 NOTE — Telephone Encounter (Signed)
Pt aware.

## 2021-05-01 ENCOUNTER — Encounter (INDEPENDENT_AMBULATORY_CARE_PROVIDER_SITE_OTHER): Payer: PPO | Admitting: Ophthalmology

## 2021-05-01 ENCOUNTER — Ambulatory Visit: Payer: PPO | Admitting: Family Medicine

## 2021-05-01 ENCOUNTER — Other Ambulatory Visit: Payer: Self-pay

## 2021-05-01 DIAGNOSIS — H43813 Vitreous degeneration, bilateral: Secondary | ICD-10-CM

## 2021-05-01 DIAGNOSIS — E113513 Type 2 diabetes mellitus with proliferative diabetic retinopathy with macular edema, bilateral: Secondary | ICD-10-CM

## 2021-05-01 DIAGNOSIS — H35033 Hypertensive retinopathy, bilateral: Secondary | ICD-10-CM | POA: Diagnosis not present

## 2021-05-01 DIAGNOSIS — I10 Essential (primary) hypertension: Secondary | ICD-10-CM | POA: Diagnosis not present

## 2021-05-01 LAB — CBC WITH DIFFERENTIAL/PLATELET
Basophils Absolute: 0.1 10*3/uL (ref 0.0–0.2)
Basos: 1 %
EOS (ABSOLUTE): 0.2 10*3/uL (ref 0.0–0.4)
Eos: 3 %
Hematocrit: 43 % (ref 37.5–51.0)
Hemoglobin: 14.8 g/dL (ref 13.0–17.7)
Immature Grans (Abs): 0 10*3/uL (ref 0.0–0.1)
Immature Granulocytes: 0 %
Lymphocytes Absolute: 2.3 10*3/uL (ref 0.7–3.1)
Lymphs: 28 %
MCH: 30.8 pg (ref 26.6–33.0)
MCHC: 34.4 g/dL (ref 31.5–35.7)
MCV: 90 fL (ref 79–97)
Monocytes Absolute: 0.7 10*3/uL (ref 0.1–0.9)
Monocytes: 9 %
Neutrophils Absolute: 4.8 10*3/uL (ref 1.4–7.0)
Neutrophils: 59 %
Platelets: 238 10*3/uL (ref 150–450)
RBC: 4.8 x10E6/uL (ref 4.14–5.80)
RDW: 12.8 % (ref 11.6–15.4)
WBC: 8.1 10*3/uL (ref 3.4–10.8)

## 2021-05-01 LAB — LIPID PANEL
Chol/HDL Ratio: 3.5 ratio (ref 0.0–5.0)
Cholesterol, Total: 190 mg/dL (ref 100–199)
HDL: 54 mg/dL (ref >39)
LDL Chol Calc (NIH): 111 mg/dL — ABNORMAL HIGH (ref 0–99)
Triglycerides: 143 mg/dL (ref 0–149)
VLDL Cholesterol Cal: 25 mg/dL (ref 5–40)

## 2021-05-01 LAB — CMP14+EGFR
ALT: 18 IU/L (ref 0–44)
AST: 19 IU/L (ref 0–40)
Albumin/Globulin Ratio: 1.6 (ref 1.2–2.2)
Albumin: 4.2 g/dL (ref 3.6–4.6)
Alkaline Phosphatase: 92 IU/L (ref 44–121)
BUN/Creatinine Ratio: 20 (ref 10–24)
BUN: 30 mg/dL — ABNORMAL HIGH (ref 8–27)
Bilirubin Total: 0.7 mg/dL (ref 0.0–1.2)
CO2: 24 mmol/L (ref 20–29)
Calcium: 9.3 mg/dL (ref 8.6–10.2)
Chloride: 102 mmol/L (ref 96–106)
Creatinine, Ser: 1.52 mg/dL — ABNORMAL HIGH (ref 0.76–1.27)
Globulin, Total: 2.6 g/dL (ref 1.5–4.5)
Glucose: 158 mg/dL — ABNORMAL HIGH (ref 70–99)
Potassium: 5.3 mmol/L — ABNORMAL HIGH (ref 3.5–5.2)
Sodium: 139 mmol/L (ref 134–144)
Total Protein: 6.8 g/dL (ref 6.0–8.5)
eGFR: 46 mL/min/{1.73_m2} — ABNORMAL LOW (ref >59)

## 2021-05-01 LAB — PSA, TOTAL AND FREE
PSA, Free Pct: 35 %
PSA, Free: 0.98 ng/mL
Prostate Specific Ag, Serum: 2.8 ng/mL (ref 0.0–4.0)

## 2021-05-02 NOTE — Progress Notes (Signed)
Hello Collin,  Your lab result is normal and/or stable.Some minor variations that are not significant are commonly marked abnormal, but do not represent any medical problem for you.  Best regards, Diesha Rostad, M.D.

## 2021-05-10 ENCOUNTER — Ambulatory Visit: Payer: PPO | Admitting: Family Medicine

## 2021-05-11 DIAGNOSIS — E1142 Type 2 diabetes mellitus with diabetic polyneuropathy: Secondary | ICD-10-CM | POA: Diagnosis not present

## 2021-05-11 DIAGNOSIS — G609 Hereditary and idiopathic neuropathy, unspecified: Secondary | ICD-10-CM | POA: Diagnosis not present

## 2021-05-11 DIAGNOSIS — L03031 Cellulitis of right toe: Secondary | ICD-10-CM | POA: Diagnosis not present

## 2021-05-29 ENCOUNTER — Other Ambulatory Visit: Payer: Self-pay

## 2021-05-29 ENCOUNTER — Encounter (INDEPENDENT_AMBULATORY_CARE_PROVIDER_SITE_OTHER): Payer: PPO | Admitting: Ophthalmology

## 2021-05-29 DIAGNOSIS — E113513 Type 2 diabetes mellitus with proliferative diabetic retinopathy with macular edema, bilateral: Secondary | ICD-10-CM

## 2021-05-29 DIAGNOSIS — H43813 Vitreous degeneration, bilateral: Secondary | ICD-10-CM

## 2021-05-29 DIAGNOSIS — H35033 Hypertensive retinopathy, bilateral: Secondary | ICD-10-CM | POA: Diagnosis not present

## 2021-05-29 DIAGNOSIS — I10 Essential (primary) hypertension: Secondary | ICD-10-CM

## 2021-06-25 ENCOUNTER — Encounter (INDEPENDENT_AMBULATORY_CARE_PROVIDER_SITE_OTHER): Payer: PPO | Admitting: Ophthalmology

## 2021-06-25 ENCOUNTER — Other Ambulatory Visit: Payer: Self-pay

## 2021-06-25 DIAGNOSIS — I10 Essential (primary) hypertension: Secondary | ICD-10-CM | POA: Diagnosis not present

## 2021-06-25 DIAGNOSIS — H43813 Vitreous degeneration, bilateral: Secondary | ICD-10-CM | POA: Diagnosis not present

## 2021-06-25 DIAGNOSIS — H35033 Hypertensive retinopathy, bilateral: Secondary | ICD-10-CM

## 2021-06-25 DIAGNOSIS — E113513 Type 2 diabetes mellitus with proliferative diabetic retinopathy with macular edema, bilateral: Secondary | ICD-10-CM | POA: Diagnosis not present

## 2021-06-25 LAB — HM DIABETES EYE EXAM

## 2021-06-28 ENCOUNTER — Encounter: Payer: Self-pay | Admitting: Family Medicine

## 2021-07-20 DIAGNOSIS — M79676 Pain in unspecified toe(s): Secondary | ICD-10-CM | POA: Diagnosis not present

## 2021-07-20 DIAGNOSIS — B351 Tinea unguium: Secondary | ICD-10-CM | POA: Diagnosis not present

## 2021-07-20 DIAGNOSIS — E1142 Type 2 diabetes mellitus with diabetic polyneuropathy: Secondary | ICD-10-CM | POA: Diagnosis not present

## 2021-07-20 DIAGNOSIS — L84 Corns and callosities: Secondary | ICD-10-CM | POA: Diagnosis not present

## 2021-08-01 ENCOUNTER — Other Ambulatory Visit: Payer: Self-pay

## 2021-08-01 ENCOUNTER — Encounter (INDEPENDENT_AMBULATORY_CARE_PROVIDER_SITE_OTHER): Payer: PPO | Admitting: Ophthalmology

## 2021-08-01 DIAGNOSIS — H35033 Hypertensive retinopathy, bilateral: Secondary | ICD-10-CM | POA: Diagnosis not present

## 2021-08-01 DIAGNOSIS — H43813 Vitreous degeneration, bilateral: Secondary | ICD-10-CM

## 2021-08-01 DIAGNOSIS — E113513 Type 2 diabetes mellitus with proliferative diabetic retinopathy with macular edema, bilateral: Secondary | ICD-10-CM | POA: Diagnosis not present

## 2021-08-01 DIAGNOSIS — I10 Essential (primary) hypertension: Secondary | ICD-10-CM

## 2021-08-14 DIAGNOSIS — Z85828 Personal history of other malignant neoplasm of skin: Secondary | ICD-10-CM | POA: Diagnosis not present

## 2021-08-14 DIAGNOSIS — L570 Actinic keratosis: Secondary | ICD-10-CM | POA: Diagnosis not present

## 2021-08-14 DIAGNOSIS — Z1283 Encounter for screening for malignant neoplasm of skin: Secondary | ICD-10-CM | POA: Diagnosis not present

## 2021-09-05 ENCOUNTER — Encounter (INDEPENDENT_AMBULATORY_CARE_PROVIDER_SITE_OTHER): Payer: PPO | Admitting: Ophthalmology

## 2021-09-05 DIAGNOSIS — H43813 Vitreous degeneration, bilateral: Secondary | ICD-10-CM | POA: Diagnosis not present

## 2021-09-05 DIAGNOSIS — E113513 Type 2 diabetes mellitus with proliferative diabetic retinopathy with macular edema, bilateral: Secondary | ICD-10-CM

## 2021-09-05 DIAGNOSIS — I10 Essential (primary) hypertension: Secondary | ICD-10-CM | POA: Diagnosis not present

## 2021-09-05 DIAGNOSIS — H35033 Hypertensive retinopathy, bilateral: Secondary | ICD-10-CM

## 2021-09-27 DIAGNOSIS — H6122 Impacted cerumen, left ear: Secondary | ICD-10-CM | POA: Diagnosis not present

## 2021-09-27 DIAGNOSIS — H6123 Impacted cerumen, bilateral: Secondary | ICD-10-CM | POA: Diagnosis not present

## 2021-09-27 DIAGNOSIS — H6121 Impacted cerumen, right ear: Secondary | ICD-10-CM | POA: Diagnosis not present

## 2021-09-28 DIAGNOSIS — L84 Corns and callosities: Secondary | ICD-10-CM | POA: Diagnosis not present

## 2021-09-28 DIAGNOSIS — E1142 Type 2 diabetes mellitus with diabetic polyneuropathy: Secondary | ICD-10-CM | POA: Diagnosis not present

## 2021-09-28 DIAGNOSIS — M79676 Pain in unspecified toe(s): Secondary | ICD-10-CM | POA: Diagnosis not present

## 2021-09-28 DIAGNOSIS — B351 Tinea unguium: Secondary | ICD-10-CM | POA: Diagnosis not present

## 2021-10-10 ENCOUNTER — Encounter (INDEPENDENT_AMBULATORY_CARE_PROVIDER_SITE_OTHER): Payer: PPO | Admitting: Ophthalmology

## 2021-10-10 DIAGNOSIS — E113513 Type 2 diabetes mellitus with proliferative diabetic retinopathy with macular edema, bilateral: Secondary | ICD-10-CM | POA: Diagnosis not present

## 2021-10-10 DIAGNOSIS — I10 Essential (primary) hypertension: Secondary | ICD-10-CM | POA: Diagnosis not present

## 2021-10-10 DIAGNOSIS — H43813 Vitreous degeneration, bilateral: Secondary | ICD-10-CM | POA: Diagnosis not present

## 2021-10-10 DIAGNOSIS — H35033 Hypertensive retinopathy, bilateral: Secondary | ICD-10-CM | POA: Diagnosis not present

## 2021-10-31 DIAGNOSIS — R112 Nausea with vomiting, unspecified: Secondary | ICD-10-CM | POA: Diagnosis not present

## 2021-10-31 DIAGNOSIS — I10 Essential (primary) hypertension: Secondary | ICD-10-CM | POA: Diagnosis not present

## 2021-10-31 DIAGNOSIS — R42 Dizziness and giddiness: Secondary | ICD-10-CM | POA: Diagnosis not present

## 2021-10-31 DIAGNOSIS — Z79899 Other long term (current) drug therapy: Secondary | ICD-10-CM | POA: Diagnosis not present

## 2021-10-31 DIAGNOSIS — E7800 Pure hypercholesterolemia, unspecified: Secondary | ICD-10-CM | POA: Diagnosis not present

## 2021-11-12 ENCOUNTER — Encounter (INDEPENDENT_AMBULATORY_CARE_PROVIDER_SITE_OTHER): Payer: PPO | Admitting: Ophthalmology

## 2021-11-15 ENCOUNTER — Encounter (INDEPENDENT_AMBULATORY_CARE_PROVIDER_SITE_OTHER): Payer: PPO | Admitting: Ophthalmology

## 2021-11-19 ENCOUNTER — Other Ambulatory Visit: Payer: Self-pay | Admitting: Family Medicine

## 2021-11-19 DIAGNOSIS — M7731 Calcaneal spur, right foot: Secondary | ICD-10-CM

## 2021-11-20 ENCOUNTER — Other Ambulatory Visit: Payer: Self-pay | Admitting: Family Medicine

## 2021-11-20 ENCOUNTER — Encounter: Payer: Self-pay | Admitting: Family Medicine

## 2021-11-20 ENCOUNTER — Ambulatory Visit (INDEPENDENT_AMBULATORY_CARE_PROVIDER_SITE_OTHER): Payer: PPO

## 2021-11-20 ENCOUNTER — Encounter (INDEPENDENT_AMBULATORY_CARE_PROVIDER_SITE_OTHER): Payer: PPO | Admitting: Ophthalmology

## 2021-11-20 ENCOUNTER — Ambulatory Visit (INDEPENDENT_AMBULATORY_CARE_PROVIDER_SITE_OTHER): Payer: PPO | Admitting: Family Medicine

## 2021-11-20 VITALS — BP 117/70 | HR 74 | Temp 97.7°F | Ht 65.0 in | Wt 173.4 lb

## 2021-11-20 DIAGNOSIS — E782 Mixed hyperlipidemia: Secondary | ICD-10-CM

## 2021-11-20 DIAGNOSIS — H35033 Hypertensive retinopathy, bilateral: Secondary | ICD-10-CM

## 2021-11-20 DIAGNOSIS — E119 Type 2 diabetes mellitus without complications: Secondary | ICD-10-CM | POA: Diagnosis not present

## 2021-11-20 DIAGNOSIS — M25552 Pain in left hip: Secondary | ICD-10-CM | POA: Diagnosis not present

## 2021-11-20 DIAGNOSIS — N401 Enlarged prostate with lower urinary tract symptoms: Secondary | ICD-10-CM | POA: Diagnosis not present

## 2021-11-20 DIAGNOSIS — M25551 Pain in right hip: Secondary | ICD-10-CM

## 2021-11-20 DIAGNOSIS — I10 Essential (primary) hypertension: Secondary | ICD-10-CM | POA: Diagnosis not present

## 2021-11-20 DIAGNOSIS — H43813 Vitreous degeneration, bilateral: Secondary | ICD-10-CM

## 2021-11-20 DIAGNOSIS — K219 Gastro-esophageal reflux disease without esophagitis: Secondary | ICD-10-CM | POA: Diagnosis not present

## 2021-11-20 DIAGNOSIS — E113513 Type 2 diabetes mellitus with proliferative diabetic retinopathy with macular edema, bilateral: Secondary | ICD-10-CM | POA: Diagnosis not present

## 2021-11-20 LAB — BAYER DCA HB A1C WAIVED: HB A1C (BAYER DCA - WAIVED): 6.2 % — ABNORMAL HIGH (ref 4.8–5.6)

## 2021-11-20 MED ORDER — FINASTERIDE 5 MG PO TABS: 5.0000 mg | ORAL_TABLET | Freq: Every day | ORAL | 3 refills | Status: DC

## 2021-11-20 MED ORDER — OLMESARTAN MEDOXOMIL 40 MG PO TABS: 40.0000 mg | ORAL_TABLET | Freq: Every day | ORAL | 3 refills | Status: DC

## 2021-11-20 MED ORDER — ATORVASTATIN CALCIUM 10 MG PO TABS: 10.0000 mg | ORAL_TABLET | Freq: Every day | ORAL | 3 refills | Status: DC

## 2021-11-20 MED ORDER — PANTOPRAZOLE SODIUM 20 MG PO TBEC: 20.0000 mg | DELAYED_RELEASE_TABLET | Freq: Every day | ORAL | 1 refills | Status: DC

## 2021-11-20 MED ORDER — METFORMIN HCL 1000 MG PO TABS: 1000.0000 mg | ORAL_TABLET | Freq: Every day | ORAL | 3 refills | Status: DC

## 2021-11-20 MED ORDER — NIFEDIPINE ER 90 MG PO TB24: 90.0000 mg | ORAL_TABLET | Freq: Every day | ORAL | 3 refills | Status: DC

## 2021-11-20 NOTE — Progress Notes (Signed)
Subjective:  Patient ID: Jared Norman, male    DOB: August 15, 1940  Age: 81 y.o. MRN: [id number]  CC: Medical Management of Chronic Issues   HPI Jared Norman presents forFollow-up of diabetes. Patient checks blood sugar at home.   80-100 fasting and 115-150 postprandial Patient denies symptoms such as polyuria, polydipsia, excessive hunger, nausea No significant hypoglycemic spells noted. Medications reviewed. Pt reports taking them regularly without complication/adverse reaction being reported today.   HAd vertigo spells starting June 20 th. Vomited all night. Had fluids at E.D. Did CT of head turned out normal. Continues to get dizzy if he arises too quickly or turns his head. LAsts a few moments. No problem if he doesn't get in a hurry. No vertigo now. Things wouldn't be still.   Patient in for follow-up of GERD. Currently asymptomatic taking  PPI daily. Never took it BID.There is no chest pain or heartburn. No hematemesis and no melena. No dysphagia or choking. Onset is remote. Progression is stable. Complicating factors, none.  History Jared Norman has a past medical history of Arthritis, BPH (benign prostatic hyperplasia), Cataract, Gallstones, Hyperlipidemia, Hypertension, Kidney stones, Type 2 diabetes mellitus ([name]), and Vitamin D deficiency.   He has a past surgical history that includes Cystoscopy with retrograde pyelogram, ureteroscopy and stent placement (Left, 12/20/2013); Foot surgery (Right, 2008); Laparoscopic cholecystectomy (05/2018); and Cataract extraction, bilateral.   His family history includes ALS in his brother; Bladder Cancer in his father; Blindness in his father and paternal grandfather; Diabetes in his brother; Hypertension in his brother.He reports that he has never smoked. He has never used smokeless tobacco. He reports that he does not drink alcohol and does not use drugs.  Current Outpatient Medications on File Prior to Visit  Medication Sig Dispense Refill    aspirin EC 81 MG tablet Take 81 mg by mouth every Monday, Wednesday, and Friday.     cholecalciferol (VITAMIN D) 1000 UNITS tablet Take 2,000 Units by mouth daily.     glucose blood (ONETOUCH VERIO) test strip Check glucose up to 4 times daily Dx E11.9 400 each 3   magnesium gluconate (MAGONATE) 500 MG tablet Take 1,000 mg by mouth daily.      meloxicam (MOBIC) 15 MG tablet Take 1 tablet by mouth once daily 90 tablet 0   OneTouch Delica Lancets 79G MISC USE   TO CHECK GLUCOSE 4 TIMES DAILY 100 each 0   No current facility-administered medications on file prior to visit.    ROS Review of Systems  Constitutional:  Negative for fever.  Respiratory:  Negative for shortness of breath.   Cardiovascular:  Negative for chest pain.  Musculoskeletal:  Positive for arthralgias (hips, with exertion).  Skin:  Negative for rash.    Objective:  BP 117/70   Pulse 74   Temp 97.7 F (36.5 C)   Ht _0  (1.651 m)   Wt 173 lb 6.4 oz (78.7 kg)   SpO2 95%   BMI 28.86 kg/m   BP Readings from Last 3 Encounters:  11/20/21 117/70  04/27/21 138/76  01/29/21 137/84    Wt Readings from Last 3 Encounters:  11/20/21 173 lb 6.4 oz (78.7 kg)  04/27/21 176 lb (79.8 kg)  02/13/21 169 lb (76.7 kg)     Physical Exam Vitals reviewed.  Constitutional:      Appearance: He is well-developed.  HENT:     Head: Normocephalic and atraumatic.     Right Ear: External ear normal.  Left Ear: External ear normal.     Mouth/Throat:     Pharynx: No oropharyngeal exudate or posterior oropharyngeal erythema.  Eyes:     Pupils: Pupils are equal, round, and reactive to light.  Cardiovascular:     Rate and Rhythm: Normal rate and regular rhythm.     Heart sounds: No murmur heard. Pulmonary:     Effort: No respiratory distress.     Breath sounds: Normal breath sounds.  Musculoskeletal:        General: No tenderness. Normal range of motion.     Cervical back: Normal range of motion and neck supple.   Neurological:     Mental Status: He is alert and oriented to person, place, and time.       Assessment & Plan:   Casmer was seen today for medical management of chronic issues.  Diagnoses and all orders for this visit:  Diabetes mellitus without complication ([place]) -     Bayer DCA Hb A1c Waived -     metFORMIN (GLUCOPHAGE) 1000 MG tablet; Take 1 tablet (1,000 mg total) by mouth daily with breakfast.  Primary hypertension -     CBC with Differential/Platelet -     CMP14+EGFR  Mixed hyperlipidemia -     Lipid panel  Benign prostatic hyperplasia with lower urinary tract symptoms, symptom details unspecified -     finasteride (PROSCAR) 5 MG tablet; Take 1 tablet (5 mg total) by mouth daily.  Gastroesophageal reflux disease without esophagitis -     pantoprazole (PROTONIX) 20 MG tablet; Take 1 tablet (20 mg total) by mouth daily.  Bilateral hip pain -     DG HIP UNILAT W OR W/O PELVIS 2-3 VIEWS LEFT; Future -     DG HIP UNILAT W OR W/O PELVIS 2-3 VIEWS RIGHT; Future  Other orders -     olmesartan (BENICAR) 40 MG tablet; Take 1 tablet (40 mg total) by mouth daily. for blood pressure -     atorvastatin (LIPITOR) 10 MG tablet; Take 1 tablet (10 mg total) by mouth daily. For cholesterol -     NIFEdipine (ADALAT CC) 90 MG 24 hr tablet; Take 1 tablet (90 mg total) by mouth daily. for blood pressure      I have discontinued Axten L. Cwynar's simvastatin and ciprofloxacin. I have also changed his pantoprazole and NIFEdipine. Additionally, I am having him start on atorvastatin. Lastly, I am having him maintain his magnesium gluconate, cholecalciferol, aspirin EC, OneTouch Verio, OneTouch Delica Lancets 69S, meloxicam, finasteride, metFORMIN, and olmesartan.  Meds ordered this encounter  Medications   finasteride (PROSCAR) 5 MG tablet    Sig: Take 1 tablet (5 mg total) by mouth daily.    Dispense:  90 tablet    Refill:  3   metFORMIN (GLUCOPHAGE) 1000 MG tablet    Sig: Take 1  tablet (1,000 mg total) by mouth daily with breakfast.    Dispense:  90 tablet    Refill:  3   olmesartan (BENICAR) 40 MG tablet    Sig: Take 1 tablet (40 mg total) by mouth daily. for blood pressure    Dispense:  90 tablet    Refill:  3   pantoprazole (PROTONIX) 20 MG tablet    Sig: Take 1 tablet (20 mg total) by mouth daily.    Dispense:  90 tablet    Refill:  1   atorvastatin (LIPITOR) 10 MG tablet    Sig: Take 1 tablet (10 mg total) by mouth daily.  For cholesterol    Dispense:  90 tablet    Refill:  3   NIFEdipine (ADALAT CC) 90 MG 24 hr tablet    Sig: Take 1 tablet (90 mg total) by mouth daily. for blood pressure    Dispense:  90 tablet    Refill:  3     Follow-up: Return in about 3 months (around 02/20/2022).  Claretta Fraise, M.D.

## 2021-11-21 LAB — CBC WITH DIFFERENTIAL/PLATELET
Basophils Absolute: 0.1 10*3/uL (ref 0.0–0.2)
Basos: 1 %
EOS (ABSOLUTE): 0.2 10*3/uL (ref 0.0–0.4)
Eos: 2 %
Hematocrit: 41.5 % (ref 37.5–51.0)
Hemoglobin: 13.8 g/dL (ref 13.0–17.7)
Immature Grans (Abs): 0 10*3/uL (ref 0.0–0.1)
Immature Granulocytes: 0 %
Lymphocytes Absolute: 2.4 10*3/uL (ref 0.7–3.1)
Lymphs: 36 %
MCH: 30.5 pg (ref 26.6–33.0)
MCHC: 33.3 g/dL (ref 31.5–35.7)
MCV: 92 fL (ref 79–97)
Monocytes Absolute: 0.6 10*3/uL (ref 0.1–0.9)
Monocytes: 9 %
Neutrophils Absolute: 3.5 10*3/uL (ref 1.4–7.0)
Neutrophils: 52 %
Platelets: 238 10*3/uL (ref 150–450)
RBC: 4.52 x10E6/uL (ref 4.14–5.80)
RDW: 13.3 % (ref 11.6–15.4)
WBC: 6.7 10*3/uL (ref 3.4–10.8)

## 2021-11-21 LAB — CMP14+EGFR
ALT: 19 IU/L (ref 0–44)
AST: 16 IU/L (ref 0–40)
Albumin/Globulin Ratio: 1.8 (ref 1.2–2.2)
Albumin: 4.1 g/dL (ref 3.7–4.7)
Alkaline Phosphatase: 70 IU/L (ref 44–121)
BUN/Creatinine Ratio: 17 (ref 10–24)
BUN: 26 mg/dL (ref 8–27)
Bilirubin Total: 0.8 mg/dL (ref 0.0–1.2)
CO2: 21 mmol/L (ref 20–29)
Calcium: 9.2 mg/dL (ref 8.6–10.2)
Chloride: 103 mmol/L (ref 96–106)
Creatinine, Ser: 1.54 mg/dL — ABNORMAL HIGH (ref 0.76–1.27)
Globulin, Total: 2.3 g/dL (ref 1.5–4.5)
Glucose: 123 mg/dL — ABNORMAL HIGH (ref 70–99)
Potassium: 5 mmol/L (ref 3.5–5.2)
Sodium: 140 mmol/L (ref 134–144)
Total Protein: 6.4 g/dL (ref 6.0–8.5)
eGFR: 45 mL/min/{1.73_m2} — ABNORMAL LOW (ref >59)

## 2021-11-21 LAB — LIPID PANEL
Chol/HDL Ratio: 3 ratio (ref 0.0–5.0)
Cholesterol, Total: 138 mg/dL (ref 100–199)
HDL: 46 mg/dL (ref >39)
LDL Chol Calc (NIH): 66 mg/dL (ref 0–99)
Triglycerides: 154 mg/dL — ABNORMAL HIGH (ref 0–149)
VLDL Cholesterol Cal: 26 mg/dL (ref 5–40)

## 2021-11-21 NOTE — Progress Notes (Signed)
Hello Tysen,  Your lab result is normal and/or stable.Some minor variations that are not significant are commonly marked abnormal, but do not represent any medical problem for you.  Best regards, Armani Brar, M.D.

## 2021-12-07 ENCOUNTER — Other Ambulatory Visit: Payer: Self-pay | Admitting: Family Medicine

## 2021-12-07 DIAGNOSIS — L03031 Cellulitis of right toe: Secondary | ICD-10-CM | POA: Diagnosis not present

## 2021-12-07 DIAGNOSIS — M79676 Pain in unspecified toe(s): Secondary | ICD-10-CM | POA: Diagnosis not present

## 2021-12-19 DIAGNOSIS — M79674 Pain in right toe(s): Secondary | ICD-10-CM | POA: Diagnosis not present

## 2021-12-19 DIAGNOSIS — L03031 Cellulitis of right toe: Secondary | ICD-10-CM | POA: Diagnosis not present

## 2021-12-24 ENCOUNTER — Encounter (INDEPENDENT_AMBULATORY_CARE_PROVIDER_SITE_OTHER): Payer: PPO | Admitting: Ophthalmology

## 2021-12-24 DIAGNOSIS — H43813 Vitreous degeneration, bilateral: Secondary | ICD-10-CM | POA: Diagnosis not present

## 2021-12-24 DIAGNOSIS — E113513 Type 2 diabetes mellitus with proliferative diabetic retinopathy with macular edema, bilateral: Secondary | ICD-10-CM | POA: Diagnosis not present

## 2021-12-24 DIAGNOSIS — I10 Essential (primary) hypertension: Secondary | ICD-10-CM

## 2021-12-24 DIAGNOSIS — H35033 Hypertensive retinopathy, bilateral: Secondary | ICD-10-CM

## 2021-12-25 ENCOUNTER — Other Ambulatory Visit: Payer: Self-pay | Admitting: Family Medicine

## 2022-01-28 ENCOUNTER — Encounter (INDEPENDENT_AMBULATORY_CARE_PROVIDER_SITE_OTHER): Payer: PPO | Admitting: Ophthalmology

## 2022-01-28 DIAGNOSIS — E113513 Type 2 diabetes mellitus with proliferative diabetic retinopathy with macular edema, bilateral: Secondary | ICD-10-CM

## 2022-01-28 DIAGNOSIS — I10 Essential (primary) hypertension: Secondary | ICD-10-CM | POA: Diagnosis not present

## 2022-01-28 DIAGNOSIS — H43813 Vitreous degeneration, bilateral: Secondary | ICD-10-CM

## 2022-01-28 DIAGNOSIS — H35033 Hypertensive retinopathy, bilateral: Secondary | ICD-10-CM | POA: Diagnosis not present

## 2022-02-18 ENCOUNTER — Other Ambulatory Visit: Payer: Self-pay | Admitting: Family Medicine

## 2022-02-18 DIAGNOSIS — M7731 Calcaneal spur, right foot: Secondary | ICD-10-CM

## 2022-02-18 DIAGNOSIS — Z1283 Encounter for screening for malignant neoplasm of skin: Secondary | ICD-10-CM | POA: Diagnosis not present

## 2022-02-18 DIAGNOSIS — L570 Actinic keratosis: Secondary | ICD-10-CM | POA: Diagnosis not present

## 2022-02-18 DIAGNOSIS — Z85828 Personal history of other malignant neoplasm of skin: Secondary | ICD-10-CM | POA: Diagnosis not present

## 2022-02-28 ENCOUNTER — Encounter (INDEPENDENT_AMBULATORY_CARE_PROVIDER_SITE_OTHER): Payer: PPO | Admitting: Ophthalmology

## 2022-02-28 DIAGNOSIS — H35033 Hypertensive retinopathy, bilateral: Secondary | ICD-10-CM | POA: Diagnosis not present

## 2022-02-28 DIAGNOSIS — I10 Essential (primary) hypertension: Secondary | ICD-10-CM | POA: Diagnosis not present

## 2022-02-28 DIAGNOSIS — H43813 Vitreous degeneration, bilateral: Secondary | ICD-10-CM

## 2022-02-28 DIAGNOSIS — E113513 Type 2 diabetes mellitus with proliferative diabetic retinopathy with macular edema, bilateral: Secondary | ICD-10-CM | POA: Diagnosis not present

## 2022-03-01 DIAGNOSIS — L84 Corns and callosities: Secondary | ICD-10-CM | POA: Diagnosis not present

## 2022-03-01 DIAGNOSIS — E1142 Type 2 diabetes mellitus with diabetic polyneuropathy: Secondary | ICD-10-CM | POA: Diagnosis not present

## 2022-03-01 DIAGNOSIS — B351 Tinea unguium: Secondary | ICD-10-CM | POA: Diagnosis not present

## 2022-03-01 DIAGNOSIS — M79676 Pain in unspecified toe(s): Secondary | ICD-10-CM | POA: Diagnosis not present

## 2022-03-11 ENCOUNTER — Ambulatory Visit (INDEPENDENT_AMBULATORY_CARE_PROVIDER_SITE_OTHER): Payer: PPO | Admitting: Family Medicine

## 2022-03-11 ENCOUNTER — Encounter: Payer: Self-pay | Admitting: Family Medicine

## 2022-03-11 VITALS — BP 163/86 | HR 78 | Temp 97.5°F | Ht 65.0 in | Wt 173.8 lb

## 2022-03-11 DIAGNOSIS — M479 Spondylosis, unspecified: Secondary | ICD-10-CM | POA: Diagnosis not present

## 2022-03-11 DIAGNOSIS — I10 Essential (primary) hypertension: Secondary | ICD-10-CM | POA: Diagnosis not present

## 2022-03-11 DIAGNOSIS — E782 Mixed hyperlipidemia: Secondary | ICD-10-CM

## 2022-03-11 DIAGNOSIS — Z23 Encounter for immunization: Secondary | ICD-10-CM

## 2022-03-11 DIAGNOSIS — E119 Type 2 diabetes mellitus without complications: Secondary | ICD-10-CM | POA: Diagnosis not present

## 2022-03-11 DIAGNOSIS — E756 Lipid storage disorder, unspecified: Secondary | ICD-10-CM

## 2022-03-11 DIAGNOSIS — E1169 Type 2 diabetes mellitus with other specified complication: Secondary | ICD-10-CM | POA: Insufficient documentation

## 2022-03-11 LAB — BAYER DCA HB A1C WAIVED: HB A1C (BAYER DCA - WAIVED): 6.7 % — ABNORMAL HIGH (ref 4.8–5.6)

## 2022-03-11 MED ORDER — NIFEDIPINE ER 60 MG PO TB24: 60.0000 mg | ORAL_TABLET | Freq: Every day | ORAL | 3 refills | Status: DC

## 2022-03-11 NOTE — Progress Notes (Signed)
Subjective:  Patient ID: Jared Norman, male    DOB: Mar 04, 1941  Age: 81 y.o. MRN: [id number]  CC: Medical Management of Chronic Issues   HPI Jared HINCHLIFFE presents forFollow-up of diabetes. Patient checks blood sugar at home.   90 fasting and 130. Eating some high carb foods, but portions are small Patient denies symptoms such as polyuria, polydipsia, excessive hunger, nausea No significant hypoglycemic spells noted. Medications reviewed. Pt reports taking them regularly without complication/adverse reaction being reported today.   BP running low at home a lot. 40-973 systolic. Lat night he skpiied the nifedipine and it went high this AM. History Jared Norman has a past medical history of Arthritis, BPH (benign prostatic hyperplasia), Cataract, Gallstones, Hyperlipidemia, Hypertension, Kidney stones, Type 2 diabetes mellitus ([place]), and Vitamin D deficiency.   He has a past surgical history that includes Cystoscopy with retrograde pyelogram, ureteroscopy and stent placement (Left, 12/20/2013); Foot surgery (Right, 2008); Laparoscopic cholecystectomy (05/2018); and Cataract extraction, bilateral.   His family history includes ALS in his brother; Bladder Cancer in his father; Blindness in his father and paternal grandfather; Diabetes in his brother; Hypertension in his brother.He reports that he has never smoked. He has never used smokeless tobacco. He reports that he does not drink alcohol and does not use drugs.  Current Outpatient Medications on File Prior to Visit  Medication Sig Dispense Refill   aspirin EC 81 MG tablet Take 81 mg by mouth every Monday, Wednesday, and Friday.     atorvastatin (LIPITOR) 10 MG tablet Take 1 tablet (10 mg total) by mouth daily. For cholesterol 90 tablet 3   cholecalciferol (VITAMIN D) 1000 UNITS tablet Take 2,000 Units by mouth daily.     finasteride (PROSCAR) 5 MG tablet Take 1 tablet (5 mg total) by mouth daily. 90 tablet 3   glucose blood (ONETOUCH VERIO)  test strip Check glucose up to 4 times daily Dx E11.9 400 each 3   magnesium gluconate (MAGONATE) 500 MG tablet Take 1,000 mg by mouth daily.      meloxicam (MOBIC) 15 MG tablet Take 1 tablet by mouth once daily 90 tablet 0   metFORMIN (GLUCOPHAGE) 1000 MG tablet Take 1 tablet (1,000 mg total) by mouth daily with breakfast. 90 tablet 3   olmesartan (BENICAR) 40 MG tablet Take 1 tablet (40 mg total) by mouth daily. for blood pressure 90 tablet 3   OneTouch Delica Lancets 53G MISC USE 1  TO CHECK GLUCOSE 4 TIMES DAILY 100 each 11   pantoprazole (PROTONIX) 20 MG tablet Take 1 tablet (20 mg total) by mouth daily. 90 tablet 1   No current facility-administered medications on file prior to visit.    ROS Review of Systems  Constitutional:  Negative for fever.  Respiratory:  Negative for shortness of breath.   Cardiovascular:  Negative for chest pain.  Musculoskeletal:  Positive for back pain (with exertion. Relief with a brief rest.) and neck pain (when he lays on his back to sleep). Negative for arthralgias.  Skin:  Negative for rash.    Objective:  BP (!) 163/86   Pulse 78   Temp (!) 97.5 F (36.4 C)   Ht 5' 5" (1.651 m)   Wt 173 lb 12.8 oz (78.8 kg)   SpO2 98%   BMI 28.92 kg/m   BP Readings from Last 3 Encounters:  03/11/22 (!) 163/86  11/20/21 117/70  04/27/21 138/76    Wt Readings from Last 3 Encounters:  03/11/22 173 lb 12.8 oz (  78.8 kg)  11/20/21 173 lb 6.4 oz (78.7 kg)  04/27/21 176 lb (79.8 kg)     Physical Exam Vitals reviewed.  Constitutional:      Appearance: He is well-developed.  HENT:     Head: Normocephalic and atraumatic.     Right Ear: External ear normal.     Left Ear: External ear normal.     Mouth/Throat:     Pharynx: No oropharyngeal exudate or posterior oropharyngeal erythema.  Eyes:     Pupils: Pupils are equal, round, and reactive to light.  Cardiovascular:     Rate and Rhythm: Normal rate and regular rhythm.     Heart sounds: No murmur  heard. Pulmonary:     Effort: No respiratory distress.     Breath sounds: Normal breath sounds.  Musculoskeletal:     Cervical back: Normal range of motion and neck supple.  Neurological:     Mental Status: He is alert and oriented to person, place, and time.       Assessment & Plan:   Mattew was seen today for medical management of chronic issues.  Diagnoses and all orders for this visit:  Diabetic lipidosis ([place]) -     Bayer DCA Hb A1c Waived -     Microalbumin / creatinine urine ratio  Primary hypertension -     CBC with Differential/Platelet -     CMP14+EGFR  Mixed hyperlipidemia -     Lipid panel  Spondylosis  Other orders -     NIFEdipine (ADALAT CC) 60 MG 24 hr tablet; Take 1 tablet (60 mg total) by mouth daily. for blood pressure   Due to the low pressures, I reduced his nifedipine from 90 to 60. This way he shouldn't have the lows that lead to skipping med and in turn causing excessive rebound.   I have changed Jared Norman's NIFEdipine. I am also having him maintain his magnesium gluconate, cholecalciferol, aspirin EC, OneTouch Verio, finasteride, metFORMIN, olmesartan, pantoprazole, atorvastatin, OneTouch Delica Lancets 09O, and meloxicam.  Meds ordered this encounter  Medications   NIFEdipine (ADALAT CC) 60 MG 24 hr tablet    Sig: Take 1 tablet (60 mg total) by mouth daily. for blood pressure    Dispense:  90 tablet    Refill:  3     Follow-up: Return in about 3 months (around 06/11/2022) for also schedule annual wellness visit.  Claretta Fraise, M.D.

## 2022-03-11 NOTE — Patient Instructions (Signed)
YOu should only hold your blood pressure medicine if the first(Systolic) number is under 90. May need extra medicine if the lower (diastolic) number is over 996

## 2022-03-12 ENCOUNTER — Telehealth: Payer: Self-pay | Admitting: Family Medicine

## 2022-03-12 LAB — CBC WITH DIFFERENTIAL/PLATELET
Basophils Absolute: 0.1 10*3/uL (ref 0.0–0.2)
Basos: 1 %
EOS (ABSOLUTE): 0.2 10*3/uL (ref 0.0–0.4)
Eos: 2 %
Hematocrit: 43.1 % (ref 37.5–51.0)
Hemoglobin: 14.7 g/dL (ref 13.0–17.7)
Immature Grans (Abs): 0 10*3/uL (ref 0.0–0.1)
Immature Granulocytes: 0 %
Lymphocytes Absolute: 2.4 10*3/uL (ref 0.7–3.1)
Lymphs: 32 %
MCH: 30.9 pg (ref 26.6–33.0)
MCHC: 34.1 g/dL (ref 31.5–35.7)
MCV: 91 fL (ref 79–97)
Monocytes Absolute: 0.7 10*3/uL (ref 0.1–0.9)
Monocytes: 9 %
Neutrophils Absolute: 4.1 10*3/uL (ref 1.4–7.0)
Neutrophils: 56 %
Platelets: 228 10*3/uL (ref 150–450)
RBC: 4.76 x10E6/uL (ref 4.14–5.80)
RDW: 13 % (ref 11.6–15.4)
WBC: 7.4 10*3/uL (ref 3.4–10.8)

## 2022-03-12 LAB — CMP14+EGFR
ALT: 20 IU/L (ref 0–44)
AST: 18 IU/L (ref 0–40)
Albumin/Globulin Ratio: 2 (ref 1.2–2.2)
Albumin: 4.7 g/dL (ref 3.7–4.7)
Alkaline Phosphatase: 81 IU/L (ref 44–121)
BUN/Creatinine Ratio: 18 (ref 10–24)
BUN: 26 mg/dL (ref 8–27)
Bilirubin Total: 0.9 mg/dL (ref 0.0–1.2)
CO2: 24 mmol/L (ref 20–29)
Calcium: 9.5 mg/dL (ref 8.6–10.2)
Chloride: 100 mmol/L (ref 96–106)
Creatinine, Ser: 1.45 mg/dL — ABNORMAL HIGH (ref 0.76–1.27)
Globulin, Total: 2.3 g/dL (ref 1.5–4.5)
Glucose: 112 mg/dL — ABNORMAL HIGH (ref 70–99)
Potassium: 4.7 mmol/L (ref 3.5–5.2)
Sodium: 138 mmol/L (ref 134–144)
Total Protein: 7 g/dL (ref 6.0–8.5)
eGFR: 48 mL/min/{1.73_m2} — ABNORMAL LOW (ref >59)

## 2022-03-12 LAB — MICROALBUMIN / CREATININE URINE RATIO
Creatinine, Urine: 35.4 mg/dL
Microalb/Creat Ratio: 31 mg/g creat — ABNORMAL HIGH (ref 0–29)
Microalbumin, Urine: 10.9 ug/mL

## 2022-03-12 LAB — LIPID PANEL
Chol/HDL Ratio: 2.7 ratio (ref 0.0–5.0)
Cholesterol, Total: 147 mg/dL (ref 100–199)
HDL: 55 mg/dL (ref >39)
LDL Chol Calc (NIH): 71 mg/dL (ref 0–99)
Triglycerides: 120 mg/dL (ref 0–149)
VLDL Cholesterol Cal: 21 mg/dL (ref 5–40)

## 2022-03-12 MED ORDER — BLOOD GLUCOSE METER KIT: PACK | 0 refills | Status: AC

## 2022-03-12 MED ORDER — NIFEDIPINE ER 60 MG PO TB24: 60.0000 mg | ORAL_TABLET | Freq: Every day | ORAL | 3 refills | Status: DC

## 2022-03-12 NOTE — Progress Notes (Signed)
Hello Donaldson,  Your lab result is normal and/or stable.Some minor variations that are not significant are commonly marked abnormal, but do not represent any medical problem for you.  Best regards, Claretta Fraise, M.D.

## 2022-03-12 NOTE — Telephone Encounter (Signed)
Resent to pharmacy patient aware

## 2022-03-29 ENCOUNTER — Encounter (INDEPENDENT_AMBULATORY_CARE_PROVIDER_SITE_OTHER): Payer: PPO | Admitting: Ophthalmology

## 2022-03-29 DIAGNOSIS — E113513 Type 2 diabetes mellitus with proliferative diabetic retinopathy with macular edema, bilateral: Secondary | ICD-10-CM

## 2022-03-29 DIAGNOSIS — H35033 Hypertensive retinopathy, bilateral: Secondary | ICD-10-CM | POA: Diagnosis not present

## 2022-03-29 DIAGNOSIS — H43813 Vitreous degeneration, bilateral: Secondary | ICD-10-CM

## 2022-03-29 DIAGNOSIS — I10 Essential (primary) hypertension: Secondary | ICD-10-CM

## 2022-04-26 ENCOUNTER — Encounter (INDEPENDENT_AMBULATORY_CARE_PROVIDER_SITE_OTHER): Payer: PPO | Admitting: Ophthalmology

## 2022-04-26 DIAGNOSIS — I10 Essential (primary) hypertension: Secondary | ICD-10-CM

## 2022-04-26 DIAGNOSIS — H35033 Hypertensive retinopathy, bilateral: Secondary | ICD-10-CM

## 2022-04-26 DIAGNOSIS — E113513 Type 2 diabetes mellitus with proliferative diabetic retinopathy with macular edema, bilateral: Secondary | ICD-10-CM

## 2022-04-26 DIAGNOSIS — H43813 Vitreous degeneration, bilateral: Secondary | ICD-10-CM | POA: Diagnosis not present

## 2022-04-29 ENCOUNTER — Ambulatory Visit (INDEPENDENT_AMBULATORY_CARE_PROVIDER_SITE_OTHER): Payer: PPO

## 2022-04-29 VITALS — Ht 65.0 in | Wt 173.0 lb

## 2022-04-29 DIAGNOSIS — Z0000 Encounter for general adult medical examination without abnormal findings: Secondary | ICD-10-CM | POA: Diagnosis not present

## 2022-04-29 NOTE — Patient Instructions (Signed)
[name] , Thank you for taking time to come for your Medicare Wellness Visit. I appreciate your ongoing commitment to your health goals. Please review the following plan we discussed and let me know if I can assist you in the future.   These are the goals we discussed:  Goals      DIET - INCREASE WATER INTAKE     Try to drink 6-8 glasses of water daily.     Have 3 meals a day     Eat 3 healthy meals daily that consist of lean proteins, fruits and vegetables Decrease carbohydrates      Prevent falls        This is a list of the screening recommended for you and due dates:  Health Maintenance  Topic Date Due   COVID-19 Vaccine (2 - Janssen risk series) 09/09/2019   Complete foot exam   02/15/2021   Eye exam for diabetics  06/25/2022   Hemoglobin A1C  09/10/2022   Yearly kidney function blood test for diabetes  03/12/2023   Yearly kidney health urinalysis for diabetes  03/12/2023   Medicare Annual Wellness Visit  04/30/2023   DTaP/Tdap/Td vaccine (2 - Td or Tdap) 12/08/2023   Pneumonia Vaccine  Completed   Flu Shot  Completed   Zoster (Shingles) Vaccine  Completed   HPV Vaccine  Aged Out    Advanced directives: Please bring a copy of your health care power of attorney and living will to the office to be added to your chart at your convenience.   Conditions/risks identified: Aim for 30 minutes of exercise or brisk walking, 6-8 glasses of water, and 5 servings of fruits and vegetables each day.   Next appointment: Follow up in one year for your annual wellness visit.   Preventive Care 82 Years and Older, Male  Preventive care refers to lifestyle choices and visits with your health care provider that can promote health and wellness. What does preventive care include? A yearly physical exam. This is also called an annual well check. Dental exams once or twice a year. Routine eye exams. Ask your health care provider how often you should have your eyes checked. Personal  lifestyle choices, including: Daily care of your teeth and gums. Regular physical activity. Eating a healthy diet. Avoiding tobacco and drug use. Limiting alcohol use. Practicing safe sex. Taking low doses of aspirin every day. Taking vitamin and mineral supplements as recommended by your health care provider. What happens during an annual well check? The services and screenings done by your health care provider during your annual well check will depend on your age, overall health, lifestyle risk factors, and family history of disease. Counseling  Your health care provider may ask you questions about your: Alcohol use. Tobacco use. Drug use. Emotional well-being. Home and relationship well-being. Sexual activity. Eating habits. History of falls. Memory and ability to understand (cognition). Work and work Statistician. Screening  You may have the following tests or measurements: Height, weight, and BMI. Blood pressure. Lipid and cholesterol levels. These may be checked every 5 years, or more frequently if you are over 13 years old. Skin check. Lung cancer screening. You may have this screening every year starting at age 3 if you have a 30-pack-year history of smoking and currently smoke or have quit within the past 15 years. Fecal occult blood test (FOBT) of the stool. You may have this test every year starting at age 67. Flexible sigmoidoscopy or colonoscopy. You may have a  sigmoidoscopy every 5 years or a colonoscopy every 10 years starting at age 68. Prostate cancer screening. Recommendations will vary depending on your family history and other risks. Hepatitis C blood test. Hepatitis B blood test. Sexually transmitted disease (STD) testing. Diabetes screening. This is done by checking your blood sugar (glucose) after you have not eaten for a while (fasting). You may have this done every 1-3 years. Abdominal aortic aneurysm (AAA) screening. You may need this if you are a  current or former smoker. Osteoporosis. You may be screened starting at age 26 if you are at high risk. Talk with your health care provider about your test results, treatment options, and if necessary, the need for more tests. Vaccines  Your health care provider may recommend certain vaccines, such as: Influenza vaccine. This is recommended every year. Tetanus, diphtheria, and acellular pertussis (Tdap, Td) vaccine. You may need a Td booster every 10 years. Zoster vaccine. You may need this after age 67. Pneumococcal 13-valent conjugate (PCV13) vaccine. One dose is recommended after age 82. Pneumococcal polysaccharide (PPSV23) vaccine. One dose is recommended after age 24. Talk to your health care provider about which screenings and vaccines you need and how often you need them. This information is not intended to replace advice given to you by your health care provider. Make sure you discuss any questions you have with your health care provider. Document Released: 05/26/2015 Document Revised: 01/17/2016 Document Reviewed: 02/28/2015 Elsevier Interactive Patient Education  2017 [place] Prevention in the Home Falls can cause injuries. They can happen to people of all ages. There are many things you can do to make your home safe and to help prevent falls. What can I do on the outside of my home? Regularly fix the edges of walkways and driveways and fix any cracks. Remove anything that might make you trip as you walk through a door, such as a raised step or threshold. Trim any bushes or trees on the path to your home. Use bright outdoor lighting. Clear any walking paths of anything that might make someone trip, such as rocks or tools. Regularly check to see if handrails are loose or broken. Make sure that both sides of any steps have handrails. Any raised decks and porches should have guardrails on the edges. Have any leaves, snow, or ice cleared regularly. Use sand or salt on  walking paths during winter. Clean up any spills in your garage right away. This includes oil or grease spills. What can I do in the bathroom? Use night lights. Install grab bars by the toilet and in the tub and shower. Do not use towel bars as grab bars. Use non-skid mats or decals in the tub or shower. If you need to sit down in the shower, use a plastic, non-slip stool. Keep the floor dry. Clean up any water that spills on the floor as soon as it happens. Remove soap buildup in the tub or shower regularly. Attach bath mats securely with double-sided non-slip rug tape. Do not have throw rugs and other things on the floor that can make you trip. What can I do in the bedroom? Use night lights. Make sure that you have a light by your bed that is easy to reach. Do not use any sheets or blankets that are too big for your bed. They should not hang down onto the floor. Have a firm chair that has side arms. You can use this for support while you get dressed. Do not  have throw rugs and other things on the floor that can make you trip. What can I do in the kitchen? Clean up any spills right away. Avoid walking on wet floors. Keep items that you use a lot in easy-to-reach places. If you need to reach something above you, use a strong step stool that has a grab bar. Keep electrical cords out of the way. Do not use floor polish or wax that makes floors slippery. If you must use wax, use non-skid floor wax. Do not have throw rugs and other things on the floor that can make you trip. What can I do with my stairs? Do not leave any items on the stairs. Make sure that there are handrails on both sides of the stairs and use them. Fix handrails that are broken or loose. Make sure that handrails are as long as the stairways. Check any carpeting to make sure that it is firmly attached to the stairs. Fix any carpet that is loose or worn. Avoid having throw rugs at the top or bottom of the stairs. If you do  have throw rugs, attach them to the floor with carpet tape. Make sure that you have a light switch at the top of the stairs and the bottom of the stairs. If you do not have them, ask someone to add them for you. What else can I do to help prevent falls? Wear shoes that: Do not have high heels. Have rubber bottoms. Are comfortable and fit you well. Are closed at the toe. Do not wear sandals. If you use a stepladder: Make sure that it is fully opened. Do not climb a closed stepladder. Make sure that both sides of the stepladder are locked into place. Ask someone to hold it for you, if possible. Clearly mark and make sure that you can see: Any grab bars or handrails. First and last steps. Where the edge of each step is. Use tools that help you move around (mobility aids) if they are needed. These include: Canes. Walkers. Scooters. Crutches. Turn on the lights when you go into a dark area. Replace any light bulbs as soon as they burn out. Set up your furniture so you have a clear path. Avoid moving your furniture around. If any of your floors are uneven, fix them. If there are any pets around you, be aware of where they are. Review your medicines with your doctor. Some medicines can make you feel dizzy. This can increase your chance of falling. Ask your doctor what other things that you can do to help prevent falls. This information is not intended to replace advice given to you by your health care provider. Make sure you discuss any questions you have with your health care provider. Document Released: 02/23/2009 Document Revised: 10/05/2015 Document Reviewed: 06/03/2014 Elsevier Interactive Patient Education  2017 [employer]

## 2022-04-29 NOTE — Progress Notes (Signed)
Subjective:   Jared Norman is a 81 y.o. male who presents for Medicare Annual/Subsequent preventive examination.  I connected with  Jared Norman on 04/29/22 by a audio enabled telemedicine application and verified that I am speaking with the correct person using two identifiers.  Patient Location: Home  Provider Location: Home Office  I discussed the limitations of evaluation and management by telemedicine. The patient expressed understanding and agreed to proceed.  Review of Systems     Cardiac Risk Factors include: advanced age (>44mn, >>77women);diabetes mellitus;dyslipidemia;hypertension;male gender     Objective:    Today's Vitals   04/29/22 0957  Weight: 173 lb (78.5 kg)  Height: _0  (1.651 m)   Body mass index is 28.79 kg/m.     04/29/2022   10:41 AM 02/13/2021    8:32 AM 02/11/2020    8:39 AM 02/10/2019    8:47 AM 01/22/2019    4:14 PM 01/21/2018    9:11 AM 07/24/2015    9:12 AM  Advanced Directives  Does Patient Have a Medical Advance Directive? Yes _1  No  Type of Advance Directive Living will;Healthcare Power of Attorney        Does patient want to make changes to medical advance directive? No - Patient declined        Copy of HBiggersin Chart? No - copy requested        Would patient like information on creating a medical advance directive?  No - Patient declined No - Patient declined No - Patient declined No - Patient declined Yes (MAU/Ambulatory/Procedural Areas - Information given)     Current Medications (verified) Outpatient Encounter Medications as of 04/29/2022  Medication Sig   amoxicillin (AMOXIL) 500 MG tablet Take 500 mg by mouth 2 (two) times daily.   aspirin EC 81 MG tablet Take 81 mg by mouth every Monday, Wednesday, and Friday.   atorvastatin (LIPITOR) 10 MG tablet Take 1 tablet (10 mg total) by mouth daily. For cholesterol   blood glucose meter kit and supplies Dispense based on patient and insurance  preference. Use up to four times daily as directed. (FOR ICD-10 E10.9, E11.9).   cholecalciferol (VITAMIN D) 1000 UNITS tablet Take 2,000 Units by mouth daily.   finasteride (PROSCAR) 5 MG tablet Take 1 tablet (5 mg total) by mouth daily.   glucose blood (ONETOUCH VERIO) test strip Check glucose up to 4 times daily Dx E11.9   magnesium gluconate (MAGONATE) 500 MG tablet Take 1,000 mg by mouth daily.    meloxicam (MOBIC) 15 MG tablet Take 1 tablet by mouth once daily   metFORMIN (GLUCOPHAGE) 1000 MG tablet Take 1 tablet (1,000 mg total) by mouth daily with breakfast.   NIFEdipine (ADALAT CC) 60 MG 24 hr tablet Take 1 tablet (60 mg total) by mouth daily. for blood pressure   olmesartan (BENICAR) 40 MG tablet Take 1 tablet (40 mg total) by mouth daily. for blood pressure   OneTouch Delica Lancets 311BMISC USE 1  TO CHECK GLUCOSE 4 TIMES DAILY   pantoprazole (PROTONIX) 20 MG tablet Take 1 tablet (20 mg total) by mouth daily.   No facility-administered encounter medications on file as of 04/29/2022.    Allergies (verified) Patient has no known allergies.   History: Past Medical History:  Diagnosis Date   Arthritis    BPH (benign prostatic hyperplasia)    Cataract    Gallstones    Hyperlipidemia    Hypertension  Kidney stones    Type 2 diabetes mellitus (HCC)    Vitamin D deficiency    Past Surgical History:  Procedure Laterality Date   CATARACT EXTRACTION, BILATERAL     CYSTOSCOPY WITH RETROGRADE PYELOGRAM, URETEROSCOPY AND STENT PLACEMENT Left 12/20/2013   Procedure: CYSTOSCOPY WITHLEFT RETROGRADE PYELOGRAM,  AND LEFT STENT PLACEMENT;  Surgeon: Malka So, MD;  Location: [facility];  Service: Urology;  Laterality: Left;   FOOT SURGERY Right 2008   Tendon rupture   LAPAROSCOPIC CHOLECYSTECTOMY  05/2018   Dr. Ladona Horns   Family History  Problem Relation Age of Onset   ALS Brother        Agent Orange   Hypertension Brother    Diabetes Brother    Bladder Cancer Father    Blindness  Father    Blindness Paternal Grandfather    Social History   Socioeconomic History   Marital status: Married    Spouse name: Jared Norman   Number of children: 7   Years of education: 12th grade   Highest education level: High school graduate  Occupational History   Occupation: Company secretary   Occupation: Architectural technologist   Occupation: retired  Tobacco Use   Smoking status: Never   Smokeless tobacco: Never  Scientific laboratory technician Use: Never used  Substance and Sexual Activity   Alcohol use: No   Drug use: No   Sexual activity: Not Currently  Other Topics Concern   Not on file  Social History Narrative   Lives home with his wife   Social Determinants of Health   Financial Resource Strain: Low Risk  (04/29/2022)   Overall Financial Resource Strain (CARDIA)    Difficulty of Paying Living Expenses: Not hard at all  Food Insecurity: No Food Insecurity (04/29/2022)   Hunger Vital Sign    Worried About Running Out of Food in the Last Year: Never true    [place] in the Last Year: Never true  Transportation Needs: No Transportation Needs (04/29/2022)   PRAPARE - Hydrologist (Medical): No    Lack of Transportation (Non-Medical): No  Physical Activity: Sufficiently Active (04/29/2022)   Exercise Vital Sign    Days of Exercise per Week: 5 days    Minutes of Exercise per Session: 30 min  Stress: No Stress Concern Present (04/29/2022)   [place]    Feeling of Stress : Not at all  Social Connections: [place] (04/29/2022)   Social Connection and Isolation Panel [NHANES]    Frequency of Communication with Friends and Family: More than three times a week    Frequency of Social Gatherings with Friends and Family: Three times a week    Attends Religious Services: More than 4 times per year    Active Member of Clubs or Organizations: Yes    Attends Music therapist: More  than 4 times per year    Marital Status: Married    Tobacco Counseling Counseling given: Not Answered   Clinical Intake:  Pre-visit preparation completed: Yes  Pain : No/denies pain  Diabetes: Yes CBG done?: No Did pt. bring in CBG monitor from home?: No  How often do you need to have someone help you when you read instructions, pamphlets, or other written materials from your doctor or pharmacy?: 1 - Never  Diabetic?yes Nutrition Risk Assessment:  Has the patient had any N/V/D within the last 2 months?  No  Does the  patient have any non-healing wounds?  No  Has the patient had any unintentional weight loss or weight gain?  No   Diabetes:  Is the patient diabetic?  Yes  If diabetic, was a CBG obtained today?  No  Did the patient bring in their glucometer from home?  No  How often do you monitor your CBG's? daily.   Financial Strains and Diabetes Management:  Are you having any financial strains with the device, your supplies or your medication? No .  Does the patient want to be seen by Chronic Care Management for management of their diabetes?  No  Would the patient like to be referred to a Nutritionist or for Diabetic Management?  No   Diabetic Exams:  Diabetic Eye Exam: Completed 06/25/21 Diabetic Foot Exam: Completed at next office visit     Interpreter Needed?: No  Information entered by :: Denman George LPN   Activities of Daily Living    04/29/2022   10:41 AM  In your present state of health, do you have any difficulty performing the following activities:  Hearing? 1  Comment without hearing aids  Vision? 0  Difficulty concentrating or making decisions? 0  Walking or climbing stairs? 0  Dressing or bathing? 0  Doing errands, shopping? 0  Preparing Food and eating ? N  Using the Toilet? N  In the past six months, have you accidently leaked urine? N  Do you have problems with loss of bowel control? N  Managing your Medications? N  Managing your  Finances? N  Housekeeping or managing your Housekeeping? N    Patient Care Team: Claretta Fraise, MD as PCP - General (Family Medicine) Satira Sark, MD as PCP - Cardiology (Cardiology) Gala Romney Cristopher Estimable, MD as Consulting Physician (Gastroenterology) Hayden Pedro, MD as Consulting Physician (Ophthalmology) Sandford Craze, MD as Referring Physician (Dermatology)  Indicate any recent Medical Services you may have received from other than [facility] providers in the past year (date may be approximate).     Assessment:   This is a routine wellness examination for [place].  Hearing/Vision screen Hearing Screening - Comments:: No concerns noted; wears bilateral hearing aids  Vision Screening - Comments:: Wears rx glasses - up to date with routine eye exams with Dr. Zigmund Daniel    Dietary issues and exercise activities discussed: Current Exercise Habits: Home exercise routine, Type of exercise: walking, Time (Minutes): 30, Frequency (Times/Week): 5, Weekly Exercise (Minutes/Week): 150, Intensity: Mild   Goals Addressed             This Visit's Progress    Prevent falls   Not on track     Depression Screen    04/29/2022   10:39 AM 03/11/2022    8:42 AM 03/11/2022    8:21 AM 11/20/2021    3:07 PM 04/27/2021    8:15 AM 02/13/2021    8:39 AM 01/29/2021    8:07 AM  PHQ 2/9 Scores  PHQ - 2 Score 0 0 0 0 0 0 0  PHQ- 9 Score  1         Fall Risk    04/29/2022    9:59 AM 03/11/2022    8:40 AM 03/11/2022    8:21 AM 11/20/2021    3:10 PM 11/20/2021    3:07 PM  [place] in the past year? 1 1 0 1 0  Number falls in past yr: 1 1  0   Injury with Fall? 0 0  0  Risk for fall due to : Impaired balance/gait;Impaired mobility History of fall(s)  History of fall(s)   Follow up Falls evaluation completed;Education provided;Falls prevention discussed Falls evaluation completed  Falls evaluation completed     FALL RISK PREVENTION PERTAINING TO THE HOME:  Any stairs in or  around the home? Yes  If so, are there any without handrails? No  Home free of loose throw rugs in walkways, pet beds, electrical cords, etc? Yes  Adequate lighting in your home to reduce risk of falls? Yes   ASSISTIVE DEVICES UTILIZED TO PREVENT FALLS:  Life alert? No  Use of a cane, walker or w/c? No  Grab bars in the bathroom? Yes  Shower chair or bench in shower? No  Elevated toilet seat or a handicapped toilet? Yes   TIMED UP AND GO:  Was the test performed? No .  Length of time to ambulate 10 feet: telephonic visit    Cognitive Function:    01/21/2018    8:19 AM  MMSE - Mini Mental State Exam  Orientation to time 5  Orientation to Place 5  Registration 3  Attention/ Calculation 5  Recall 3  Language- name 2 objects 2  Language- repeat 1  Language- follow 3 step command 3  Language- read & follow direction 1  Write a sentence 1  Copy design 1  Total score 30        04/29/2022   10:42 AM 02/11/2020    8:45 AM 02/10/2019    9:03 AM  6CIT Screen  What Year? 0 points 0 points 0 points  What month? 0 points 0 points 0 points  What time? 0 points 0 points 0 points  Count back from 20 0 points 0 points 0 points  Months in reverse 0 points 0 points 0 points  Repeat phrase 0 points 0 points 0 points  Total Score 0 points 0 points 0 points    Immunizations Immunization History  Administered Date(s) Administered   Fluad Quad(high Dose 65+) 02/16/2019, 02/16/2020, 01/29/2021, 03/11/2022   Influenza, High Dose Seasonal PF 01/21/2018   Influenza,inj,Quad PF,6+ Mos 02/26/2013, 04/19/2014, 02/20/2015, 02/15/2016, 02/07/2017   Janssen (J&J) SARS-COV-2 Vaccination 08/12/2019   Pneumococcal Conjugate-13 11/18/2014   Pneumococcal Polysaccharide-23 05/13/2010   Tdap 12/07/2013   Zoster Recombinat (Shingrix) 01/21/2018, 05/09/2018    TDAP status: Up to date  Flu Vaccine status: Up to date  Pneumococcal vaccine status: Up to date  Covid-19 vaccine status: Information  provided on how to obtain vaccines.   Qualifies for Shingles Vaccine? Yes   Zostavax completed No   Shingrix Completed?: Yes  Screening Tests Health Maintenance  Topic Date Due   COVID-19 Vaccine (2 - Janssen risk series) 09/09/2019   FOOT EXAM  02/15/2021   OPHTHALMOLOGY EXAM  06/25/2022   HEMOGLOBIN A1C  09/10/2022   Diabetic kidney evaluation - eGFR measurement  03/12/2023   Diabetic kidney evaluation - Urine ACR  03/12/2023   Medicare Annual Wellness (AWV)  04/30/2023   DTaP/Tdap/Td (2 - Td or Tdap) 12/08/2023   Pneumonia Vaccine 33+ Years old  Completed   INFLUENZA VACCINE  Completed   Zoster Vaccines- Shingrix  Completed   HPV VACCINES  Aged Out    Health Maintenance  Health Maintenance Due  Topic Date Due   COVID-19 Vaccine (2 - Janssen risk series) 09/09/2019   FOOT EXAM  02/15/2021    Colorectal cancer screening: No longer required.   Lung Cancer Screening: (Low Dose CT Chest recommended if Age 44-80  years, 30 pack-year currently smoking OR have quit w/in 15years.) does not qualify.   Lung Cancer Screening Referral: n/a   Additional Screening:  Hepatitis C Screening: does not qualify  Vision Screening: Recommended annual ophthalmology exams for early detection of glaucoma and other disorders of the eye. Is the patient up to date with their annual eye exam?  Yes  Who is the provider or what is the name of the office in which the patient attends annual eye exams? Dr. Zigmund Daniel  If pt is not established with a provider, would they like to be referred to a provider to establish care? No .   Dental Screening: Recommended annual dental exams for proper oral hygiene  Community Resource Referral / Chronic Care Management: CRR required this visit?  No   CCM required this visit?  No      Plan:     I have personally reviewed and noted the following in the patient's chart:   Medical and social history Use of alcohol, tobacco or illicit drugs  Current  medications and supplements including opioid prescriptions. Patient is not currently taking opioid prescriptions. Functional ability and status Nutritional status Physical activity Advanced directives List of other physicians Hospitalizations, surgeries, and ER visits in previous 12 months Vitals Screenings to include cognitive, depression, and falls Referrals and appointments  In addition, I have reviewed and discussed with patient certain preventive protocols, quality metrics, and best practice recommendations. A written personalized care plan for preventive services as well as general preventive health recommendations were provided to patient.     Vanetta Mulders, [place]   28/20/6015   Due to this being a virtual visit, the after visit summary with patients personalized plan was offered to patient via mail or my-chart.  Patient would like to access on my-chart  Nurse Notes: No concerns

## 2022-05-17 ENCOUNTER — Other Ambulatory Visit: Payer: Self-pay | Admitting: Family Medicine

## 2022-05-17 DIAGNOSIS — M7731 Calcaneal spur, right foot: Secondary | ICD-10-CM

## 2022-05-17 DIAGNOSIS — K219 Gastro-esophageal reflux disease without esophagitis: Secondary | ICD-10-CM

## 2022-05-27 ENCOUNTER — Encounter (INDEPENDENT_AMBULATORY_CARE_PROVIDER_SITE_OTHER): Payer: PPO | Admitting: Ophthalmology

## 2022-05-27 DIAGNOSIS — H43813 Vitreous degeneration, bilateral: Secondary | ICD-10-CM

## 2022-05-27 DIAGNOSIS — I10 Essential (primary) hypertension: Secondary | ICD-10-CM

## 2022-05-27 DIAGNOSIS — E113513 Type 2 diabetes mellitus with proliferative diabetic retinopathy with macular edema, bilateral: Secondary | ICD-10-CM | POA: Diagnosis not present

## 2022-05-27 DIAGNOSIS — H35033 Hypertensive retinopathy, bilateral: Secondary | ICD-10-CM | POA: Diagnosis not present

## 2022-05-31 DIAGNOSIS — E1142 Type 2 diabetes mellitus with diabetic polyneuropathy: Secondary | ICD-10-CM | POA: Diagnosis not present

## 2022-05-31 DIAGNOSIS — M79676 Pain in unspecified toe(s): Secondary | ICD-10-CM | POA: Diagnosis not present

## 2022-05-31 DIAGNOSIS — B351 Tinea unguium: Secondary | ICD-10-CM | POA: Diagnosis not present

## 2022-05-31 DIAGNOSIS — L84 Corns and callosities: Secondary | ICD-10-CM | POA: Diagnosis not present

## 2022-06-12 ENCOUNTER — Encounter (INDEPENDENT_AMBULATORY_CARE_PROVIDER_SITE_OTHER): Payer: PPO | Admitting: Ophthalmology

## 2022-06-12 DIAGNOSIS — H26492 Other secondary cataract, left eye: Secondary | ICD-10-CM

## 2022-06-13 ENCOUNTER — Encounter: Payer: Self-pay | Admitting: Family Medicine

## 2022-06-13 ENCOUNTER — Encounter (INDEPENDENT_AMBULATORY_CARE_PROVIDER_SITE_OTHER): Payer: PPO | Admitting: Ophthalmology

## 2022-06-13 ENCOUNTER — Ambulatory Visit (INDEPENDENT_AMBULATORY_CARE_PROVIDER_SITE_OTHER): Payer: PPO | Admitting: Family Medicine

## 2022-06-13 VITALS — BP 146/80 | HR 62 | Temp 97.9°F | Ht 65.0 in | Wt 174.0 lb

## 2022-06-13 DIAGNOSIS — K219 Gastro-esophageal reflux disease without esophagitis: Secondary | ICD-10-CM

## 2022-06-13 DIAGNOSIS — I10 Essential (primary) hypertension: Secondary | ICD-10-CM | POA: Diagnosis not present

## 2022-06-13 DIAGNOSIS — E782 Mixed hyperlipidemia: Secondary | ICD-10-CM | POA: Diagnosis not present

## 2022-06-13 DIAGNOSIS — E119 Type 2 diabetes mellitus without complications: Secondary | ICD-10-CM | POA: Diagnosis not present

## 2022-06-13 DIAGNOSIS — I7090 Unspecified atherosclerosis: Secondary | ICD-10-CM | POA: Diagnosis not present

## 2022-06-13 LAB — CBC WITH DIFFERENTIAL/PLATELET
Basophils Absolute: 0.1 10*3/uL (ref 0.0–0.2)
Basos: 1 %
EOS (ABSOLUTE): 0.1 10*3/uL (ref 0.0–0.4)
Eos: 2 %
Hematocrit: 45.2 % (ref 37.5–51.0)
Hemoglobin: 15.2 g/dL (ref 13.0–17.7)
Immature Grans (Abs): 0 10*3/uL (ref 0.0–0.1)
Immature Granulocytes: 0 %
Lymphocytes Absolute: 2.3 10*3/uL (ref 0.7–3.1)
Lymphs: 32 %
MCH: 30.6 pg (ref 26.6–33.0)
MCHC: 33.6 g/dL (ref 31.5–35.7)
MCV: 91 fL (ref 79–97)
Monocytes Absolute: 0.8 10*3/uL (ref 0.1–0.9)
Monocytes: 10 %
Neutrophils Absolute: 4.1 10*3/uL (ref 1.4–7.0)
Neutrophils: 55 %
Platelets: 221 10*3/uL (ref 150–450)
RBC: 4.97 x10E6/uL (ref 4.14–5.80)
RDW: 12.3 % (ref 11.6–15.4)
WBC: 7.4 10*3/uL (ref 3.4–10.8)

## 2022-06-13 LAB — CMP14+EGFR
ALT: 23 IU/L (ref 0–44)
AST: 24 IU/L (ref 0–40)
Albumin/Globulin Ratio: 2 (ref 1.2–2.2)
Albumin: 4.5 g/dL (ref 3.7–4.7)
Alkaline Phosphatase: 83 IU/L (ref 44–121)
BUN/Creatinine Ratio: 17 (ref 10–24)
BUN: 22 mg/dL (ref 8–27)
Bilirubin Total: 1.1 mg/dL (ref 0.0–1.2)
CO2: 25 mmol/L (ref 20–29)
Calcium: 9.7 mg/dL (ref 8.6–10.2)
Chloride: 100 mmol/L (ref 96–106)
Creatinine, Ser: 1.26 mg/dL (ref 0.76–1.27)
Globulin, Total: 2.3 g/dL (ref 1.5–4.5)
Glucose: 116 mg/dL — ABNORMAL HIGH (ref 70–99)
Potassium: 4.9 mmol/L (ref 3.5–5.2)
Sodium: 139 mmol/L (ref 134–144)
Total Protein: 6.8 g/dL (ref 6.0–8.5)
eGFR: 57 mL/min/{1.73_m2} — ABNORMAL LOW (ref >59)

## 2022-06-13 LAB — LIPID PANEL
Chol/HDL Ratio: 2.3 ratio (ref 0.0–5.0)
Cholesterol, Total: 135 mg/dL (ref 100–199)
HDL: 59 mg/dL (ref >39)
LDL Chol Calc (NIH): 61 mg/dL (ref 0–99)
Triglycerides: 78 mg/dL (ref 0–149)
VLDL Cholesterol Cal: 15 mg/dL (ref 5–40)

## 2022-06-13 LAB — BAYER DCA HB A1C WAIVED: HB A1C (BAYER DCA - WAIVED): 6.8 % — ABNORMAL HIGH (ref 4.8–5.6)

## 2022-06-13 MED ORDER — PANTOPRAZOLE SODIUM 20 MG PO TBEC: 20.0000 mg | DELAYED_RELEASE_TABLET | Freq: Every day | ORAL | 2 refills | Status: DC

## 2022-06-13 MED ORDER — NIFEDIPINE ER 30 MG PO TB24: 30.0000 mg | ORAL_TABLET | Freq: Every day | ORAL | 1 refills | Status: DC

## 2022-06-13 NOTE — Progress Notes (Signed)
Subjective:  Patient ID: Jared Norman,  male    DOB: 1940-09-22  Age: 82 y.o.    CC: Medical Management of Chronic Issues   HPI Jared Norman presents for  follow-up of hypertension. Patient has no history of headache chest pain or shortness of breath or recent cough. Patient also denies symptoms of TIA such as numbness weakness lateralizing. Patient denies side effects from medication. States taking it regularly.  Patient also  in for follow-up of elevated cholesterol. Doing well without complaints on current medication. Denies side effects  including myalgia and arthralgia and nausea. Also in today for liver function testing. Currently no chest pain, shortness of breath or other cardiovascular related symptoms noted.  Follow-up of diabetes. Patient does check blood sugar at home. Readings run between `100-150 Patient denies symptoms such as excessive hunger or urinary frequency, excessive hunger, nausea No significant hypoglycemic spells noted. Medications reviewed. Pt reports taking them regularly. Pt. denies complication/adverse reaction today.   Pictures of retina showed abnormality that made Eye specialist feelhe needed to see a cardiologist. Filling of the arteries took 52 minutes for retinal angiogram. Usually takes 12   History Jared Norman has a past medical history of Arthritis, BPH (benign prostatic hyperplasia), Cataract, Gallstones, Hyperlipidemia, Hypertension, Kidney stones, Type 2 diabetes mellitus ([place]), and Vitamin D deficiency.   He has a past surgical history that includes Cystoscopy with retrograde pyelogram, ureteroscopy and stent placement (Left, 12/20/2013); Foot surgery (Right, 2008); Laparoscopic cholecystectomy (05/2018); and Cataract extraction, bilateral.   His family history includes ALS in his brother; Bladder Cancer in his father; Blindness in his father and paternal grandfather; Diabetes in his brother; Hypertension in his brother.He reports that he has  never smoked. He has never used smokeless tobacco. He reports that he does not drink alcohol and does not use drugs.  Current Outpatient Medications on File Prior to Visit  Medication Sig Dispense Refill   aspirin EC 81 MG tablet Take 81 mg by mouth every Monday, Wednesday, and Friday.     atorvastatin (LIPITOR) 10 MG tablet Take 1 tablet (10 mg total) by mouth daily. For cholesterol 90 tablet 3   blood glucose meter kit and supplies Dispense based on patient and insurance preference. Use up to four times daily as directed. (FOR ICD-10 E10.9, E11.9). 1 each 0   cholecalciferol (VITAMIN D) 1000 UNITS tablet Take 2,000 Units by mouth daily.     finasteride (PROSCAR) 5 MG tablet Take 1 tablet (5 mg total) by mouth daily. 90 tablet 3   glucose blood (ONETOUCH VERIO) test strip Check glucose up to 4 times daily Dx E11.9 400 each 3   magnesium gluconate (MAGONATE) 500 MG tablet Take 1,000 mg by mouth daily.      meloxicam (MOBIC) 15 MG tablet Take 1 tablet by mouth once daily 90 tablet 3   metFORMIN (GLUCOPHAGE) 1000 MG tablet Take 1 tablet (1,000 mg total) by mouth daily with breakfast. 90 tablet 3   olmesartan (BENICAR) 40 MG tablet Take 1 tablet (40 mg total) by mouth daily. for blood pressure 90 tablet 3   OneTouch Delica Lancets 25K MISC USE 1  TO CHECK GLUCOSE 4 TIMES DAILY 100 each 11   No current facility-administered medications on file prior to visit.    ROS Review of Systems  Constitutional:  Negative for fever.  Respiratory:  Negative for shortness of breath.   Cardiovascular:  Negative for chest pain.  Musculoskeletal:  Negative for arthralgias.  Skin:  Negative  for rash.    Objective:  BP (!) 146/80   Pulse 62   Temp 97.9 F (36.6 C)   Ht '5\' 5"'$  (1.651 m)   Wt 174 lb (78.9 kg)   SpO2 98%   BMI 28.96 kg/m   BP Readings from Last 3 Encounters:  06/13/22 (!) 146/80  03/11/22 (!) 163/86  11/20/21 117/70    Wt Readings from Last 3 Encounters:  06/13/22 174 lb (78.9 kg)   04/29/22 173 lb (78.5 kg)  03/11/22 173 lb 12.8 oz (78.8 kg)     Physical Exam Vitals reviewed.  Constitutional:      Appearance: He is well-developed.  HENT:     Head: Normocephalic and atraumatic.     Right Ear: External ear normal.     Left Ear: External ear normal.     Mouth/Throat:     Pharynx: No oropharyngeal exudate or posterior oropharyngeal erythema.  Eyes:     Pupils: Pupils are equal, round, and reactive to light.  Cardiovascular:     Rate and Rhythm: Normal rate and regular rhythm.     Heart sounds: No murmur heard. Pulmonary:     Effort: No respiratory distress.     Breath sounds: Normal breath sounds.  Musculoskeletal:        General: No tenderness. Normal range of motion.     Cervical back: Normal range of motion and neck supple.  Skin:    General: Skin is warm and dry.  Neurological:     Mental Status: He is alert and oriented to person, place, and time.     Diabetic Foot Form - Detailed   Diabetic Foot Exam - detailed  06/13/2022 10:30 AM  Can the patient see the bottom of their feet?: No Are the shoes appropriate in style and fit?: No Is there swelling or and abnormal foot shape?: No Is there a claw toe deformity?: Yes Is there elevated skin temparature?: No Is there foot or ankle muscle weakness?: Yes Normal Range of Motion: No Pulse Foot Exam completed.: Yes   Right posterior Tibialias: Diminished Left posterior Tibialias: Diminished   Right Dorsalis Pedis: Diminished Left Dorsalis Pedis: Diminished  Sensory Foot Exam Completed.: Yes Semmes-Weinstein Monofilament Test R Site 1-Great Toe: Pos L Site 1-Great Toe: Pos     Comments: Marked surgical deformity at the left ankle. There are hammer toes at theright 2-5 toes. The fist toe has a callus. There is a 3 mm abrasion at the distal medial tip of the 1st R toe       Lab Results  Component Value Date   HGBA1C 6.8 (H) 06/13/2022   HGBA1C 6.7 (H) 03/11/2022   HGBA1C 6.2 (H) 11/20/2021     Assessment & Plan:   Jared Norman was seen today for medical management of chronic issues.  Diagnoses and all orders for this visit:  Diabetes mellitus without complication ([place]) -     Bayer DCA Hb A1c Waived  Primary hypertension -     CBC with Differential/Platelet -     CMP14+EGFR  Mixed hyperlipidemia -     Lipid panel  Gastroesophageal reflux disease without esophagitis -     pantoprazole (PROTONIX) 20 MG tablet; Take 1 tablet (20 mg total) by mouth daily.  Atherosclerosis -     Ambulatory referral to Cardiology  Other orders -     NIFEdipine (ADALAT CC) 30 MG 24 hr tablet; Take 1 tablet (30 mg total) by mouth daily. for blood pressure   I have  discontinued Jared Norman's amoxicillin. I have also changed his pantoprazole and NIFEdipine. Additionally, I am having him maintain his magnesium gluconate, cholecalciferol, aspirin EC, OneTouch Verio, finasteride, metFORMIN, olmesartan, atorvastatin, OneTouch Delica Lancets 16D, blood glucose meter kit and supplies, and meloxicam.  Meds ordered this encounter  Medications   pantoprazole (PROTONIX) 20 MG tablet    Sig: Take 1 tablet (20 mg total) by mouth daily.    Dispense:  90 tablet    Refill:  2   NIFEdipine (ADALAT CC) 30 MG 24 hr tablet    Sig: Take 1 tablet (30 mg total) by mouth daily. for blood pressure    Dispense:  90 tablet    Refill:  1     Follow-up: Return in about 3 months (around 09/11/2022).  Claretta Fraise, M.D.

## 2022-06-16 NOTE — Progress Notes (Signed)
Hello Jared Norman,  Your lab result is normal and/or stable.Some minor variations that are not significant are commonly marked abnormal, but do not represent any medical problem for you.  Best regards, Claretta Fraise, M.D.

## 2022-06-17 ENCOUNTER — Encounter: Payer: Self-pay | Admitting: Internal Medicine

## 2022-06-17 ENCOUNTER — Ambulatory Visit: Payer: PPO | Attending: Internal Medicine | Admitting: Internal Medicine

## 2022-06-17 VITALS — BP 126/64 | HR 64 | Ht 63.5 in | Wt 176.2 lb

## 2022-06-17 DIAGNOSIS — I10 Essential (primary) hypertension: Secondary | ICD-10-CM | POA: Diagnosis not present

## 2022-06-17 DIAGNOSIS — Z136 Encounter for screening for cardiovascular disorders: Secondary | ICD-10-CM | POA: Diagnosis not present

## 2022-06-17 NOTE — Patient Instructions (Addendum)
Medication Instructions:  Your physician recommends that you continue on your current medications as directed. Please refer to the Current Medication list given to you today.  Labwork: none  Testing/Procedures: Calcium Score  Follow-Up: Your physician recommends that you schedule a follow-up appointment in: 1 year. You will receive a reminder call in the mail in about 10 months reminding you to call and schedule your appointment. If you don't receive this call, please contact our office.  Any Other Special Instructions Will Be Listed Below (If Applicable).  If you need a refill on your cardiac medications before your next appointment, please call your pharmacy.

## 2022-06-17 NOTE — Progress Notes (Signed)
Cardiology Office Note  Date: 06/17/2022   ID: Jared Norman, DOB 1941/01/16, MRN [id number]  PCP:  Claretta Fraise, MD  Cardiologist:  Chalmers Guest, MD Electrophysiologist:  None   Reason for Office Visit: Screening for CAD evaluation at the request of Dr. Livia Snellen   History of Present Illness: Jared Norman is a 82 y.o. male known to have HTN, DM 2, HLD was referred to cardiology clinic for screening of CAD.  Patient was initially referred to cardiology clinic in 2020 for chest pain when NM stress test showed no evidence of ischemia and echocardiogram showed normal LVEF, no valve abnormalities. He presents today as a new patient visit for screening of CAD. He reported that he usually goes to the ophthalmologist to get his eye injections every month. Usually takes for the provider to take pictures in 15 seconds however this time it was prolonged to 52 seconds. The ophthalmology provider instructed him to follow-up with cardiology. Patient denies any angina, DOE, lightheadedness, dizziness, syncope, palpitations or leg swelling. He is not active at baseline and he is activity/ambulation is limited due to back pain.  Denies smoking cigarettes.  Denies any claudication.  Past Medical History:  Diagnosis Date   Arthritis    BPH (benign prostatic hyperplasia)    Cataract    Gallstones    Hyperlipidemia    Hypertension    Kidney stones    Type 2 diabetes mellitus ([place])    Vitamin D deficiency     Past Surgical History:  Procedure Laterality Date   CATARACT EXTRACTION, BILATERAL     CYSTOSCOPY WITH RETROGRADE PYELOGRAM, URETEROSCOPY AND STENT PLACEMENT Left 12/20/2013   Procedure: CYSTOSCOPY WITHLEFT RETROGRADE PYELOGRAM,  AND LEFT STENT PLACEMENT;  Surgeon: Malka So, MD;  Location: [facility];  Service: Urology;  Laterality: Left;   FOOT SURGERY Right 2008   Tendon rupture   LAPAROSCOPIC CHOLECYSTECTOMY  05/2018   Dr. Ladona Horns    Current Outpatient Medications  Medication  Sig Dispense Refill   aspirin EC 81 MG tablet Take 81 mg by mouth every Monday, Wednesday, and Friday.     atorvastatin (LIPITOR) 10 MG tablet Take 1 tablet (10 mg total) by mouth daily. For cholesterol 90 tablet 3   cholecalciferol (VITAMIN D) 1000 UNITS tablet Take 2,000 Units by mouth daily.     finasteride (PROSCAR) 5 MG tablet Take 1 tablet (5 mg total) by mouth daily. 90 tablet 3   magnesium gluconate (MAGONATE) 500 MG tablet Take 1,000 mg by mouth daily.      meloxicam (MOBIC) 15 MG tablet Take 1 tablet by mouth once daily 90 tablet 3   metFORMIN (GLUCOPHAGE) 1000 MG tablet Take 1 tablet (1,000 mg total) by mouth daily with breakfast. 90 tablet 3   NIFEdipine (ADALAT CC) 30 MG 24 hr tablet Take 1 tablet (30 mg total) by mouth daily. for blood pressure 90 tablet 1   olmesartan (BENICAR) 40 MG tablet Take 1 tablet (40 mg total) by mouth daily. for blood pressure 90 tablet 3   pantoprazole (PROTONIX) 20 MG tablet Take 1 tablet (20 mg total) by mouth daily. 90 tablet 2   blood glucose meter kit and supplies Dispense based on patient and insurance preference. Use up to four times daily as directed. (FOR ICD-10 E10.9, E11.9). 1 each 0   glucose blood (ONETOUCH VERIO) test strip Check glucose up to 4 times daily Dx E11.9 400 each 3   OneTouch Delica Lancets 63S MISC USE 1  TO CHECK GLUCOSE 4 TIMES DAILY 100 each 11   No current facility-administered medications for this visit.   Allergies:  Patient has no known allergies.   Social History: The patient  reports that he has never smoked. He has never used smokeless tobacco. He reports that he does not drink alcohol and does not use drugs.   Family History: The patient's family history includes ALS in his brother; Bladder Cancer in his father; Blindness in his father and paternal grandfather; Diabetes in his brother; Hypertension in his brother.   ROS:  Please see the history of present illness. Otherwise, complete review of systems is positive  for none.  All other systems are reviewed and negative.   Physical Exam: VS:  BP 126/64   Pulse 64   Ht 5' 3.5" (1.613 m)   Wt 176 lb 3.2 oz (79.9 kg)   SpO2 98%   BMI 30.72 kg/m , BMI Body mass index is 30.72 kg/m.  Wt Readings from Last 3 Encounters:  06/17/22 176 lb 3.2 oz (79.9 kg)  06/13/22 174 lb (78.9 kg)  04/29/22 173 lb (78.5 kg)    General: Patient appears comfortable at rest. HEENT: Conjunctiva and lids normal, oropharynx clear with moist mucosa. Neck: Supple, no elevated JVP or carotid bruits, no thyromegaly. Lungs: Clear to auscultation, nonlabored breathing at rest. Cardiac: Regular rate and rhythm, no S3 or significant systolic murmur, no pericardial rub. Abdomen: Soft, nontender, no hepatomegaly, bowel sounds present, no guarding or rebound. Extremities: No pitting edema, distal pulses 2+. Skin: Warm and dry. Musculoskeletal: No kyphosis. Neuropsychiatric: Alert and oriented x3, affect grossly appropriate.  ECG:  An ECG dated 06/17/2022 was personally reviewed today and demonstrated:  Normal sinus rhythm, first-degree AV block and PVCs  Recent Labwork: 06/13/2022: ALT 23; AST 24; BUN 22; Creatinine, Ser 1.26; Hemoglobin 15.2; Platelets 221; Potassium 4.9; Sodium 139     Component Value Date/Time   CHOL 135 06/13/2022 1022   TRIG 78 06/13/2022 1022   TRIG 119 06/01/2013 1101   HDL 59 06/13/2022 1022   HDL 45 06/01/2013 1101   CHOLHDL 2.3 06/13/2022 1022   CHOLHDL 3.5 04/19/2014 1042   VLDL 15 04/19/2014 1042   LDLCALC 61 06/13/2022 1022   LDLCALC 68 06/01/2013 1101    Other Studies Reviewed Today:   Assessment and Plan: Patient is 82 year old M known to have HTN, DM 2, HLD was referred to cardiology clinic for screening of CAD.  # Screening of CAD -Obtain CT calcium scoring of coronaries -Discontinue aspirin for primary prevention of CAD  # HTN, controlled -Continue nifedipine 30 mg once daily -Continue olmesartan 40 mg once daily -HTN management  per PCP  # HLD -Continue atorvastatin 10 mg nightly -H LD management per PCP  I have spent a total of 45 minutes with patient reviewing chart, EKGs, labs and examining patient as well as establishing an assessment and plan that was discussed with the patient.  > 50% of time was spent in direct patient care.     Medication Adjustments/Labs and Tests Ordered: Current medicines are reviewed at length with the patient today.  Concerns regarding medicines are outlined above.   Tests Ordered: Orders Placed This Encounter  Procedures   CT CARDIAC SCORING (SELF PAY ONLY)   EKG 12-Lead    Medication Changes: No orders of the defined types were placed in this encounter.   Disposition:  Follow up  one year  Signed Janee Ureste Fidel Levy, MD, 06/17/2022 3:36 PM  [facility] [place], [place],  [address]

## 2022-06-24 ENCOUNTER — Encounter (INDEPENDENT_AMBULATORY_CARE_PROVIDER_SITE_OTHER): Payer: PPO | Admitting: Ophthalmology

## 2022-06-24 DIAGNOSIS — H43813 Vitreous degeneration, bilateral: Secondary | ICD-10-CM | POA: Diagnosis not present

## 2022-06-24 DIAGNOSIS — I10 Essential (primary) hypertension: Secondary | ICD-10-CM | POA: Diagnosis not present

## 2022-06-24 DIAGNOSIS — H35033 Hypertensive retinopathy, bilateral: Secondary | ICD-10-CM | POA: Diagnosis not present

## 2022-06-24 DIAGNOSIS — E113513 Type 2 diabetes mellitus with proliferative diabetic retinopathy with macular edema, bilateral: Secondary | ICD-10-CM

## 2022-07-22 ENCOUNTER — Encounter (INDEPENDENT_AMBULATORY_CARE_PROVIDER_SITE_OTHER): Payer: PPO | Admitting: Ophthalmology

## 2022-07-22 DIAGNOSIS — E113513 Type 2 diabetes mellitus with proliferative diabetic retinopathy with macular edema, bilateral: Secondary | ICD-10-CM | POA: Diagnosis not present

## 2022-07-22 DIAGNOSIS — H43813 Vitreous degeneration, bilateral: Secondary | ICD-10-CM

## 2022-07-22 DIAGNOSIS — I10 Essential (primary) hypertension: Secondary | ICD-10-CM | POA: Diagnosis not present

## 2022-07-22 DIAGNOSIS — H35033 Hypertensive retinopathy, bilateral: Secondary | ICD-10-CM | POA: Diagnosis not present

## 2022-07-25 ENCOUNTER — Encounter: Payer: Self-pay | Admitting: *Deleted

## 2022-08-01 ENCOUNTER — Ambulatory Visit (HOSPITAL_COMMUNITY)
Admission: RE | Admit: 2022-08-01 | Discharge: 2022-08-01 | Disposition: A | Discharge status: Home / Self Care | Payer: PPO | Source: Ambulatory Visit | Attending: Internal Medicine | Admitting: Internal Medicine

## 2022-08-01 DIAGNOSIS — Z136 Encounter for screening for cardiovascular disorders: Secondary | ICD-10-CM

## 2022-08-14 DIAGNOSIS — E1142 Type 2 diabetes mellitus with diabetic polyneuropathy: Secondary | ICD-10-CM | POA: Diagnosis not present

## 2022-08-14 DIAGNOSIS — B351 Tinea unguium: Secondary | ICD-10-CM | POA: Diagnosis not present

## 2022-08-14 DIAGNOSIS — L84 Corns and callosities: Secondary | ICD-10-CM | POA: Diagnosis not present

## 2022-08-14 DIAGNOSIS — M79676 Pain in unspecified toe(s): Secondary | ICD-10-CM | POA: Diagnosis not present

## 2022-08-20 DIAGNOSIS — L570 Actinic keratosis: Secondary | ICD-10-CM | POA: Diagnosis not present

## 2022-08-20 DIAGNOSIS — D239 Other benign neoplasm of skin, unspecified: Secondary | ICD-10-CM | POA: Diagnosis not present

## 2022-08-20 DIAGNOSIS — Z1283 Encounter for screening for malignant neoplasm of skin: Secondary | ICD-10-CM | POA: Diagnosis not present

## 2022-08-20 DIAGNOSIS — Z85828 Personal history of other malignant neoplasm of skin: Secondary | ICD-10-CM | POA: Diagnosis not present

## 2022-08-26 ENCOUNTER — Encounter (INDEPENDENT_AMBULATORY_CARE_PROVIDER_SITE_OTHER): Payer: PPO | Admitting: Ophthalmology

## 2022-08-26 DIAGNOSIS — H35033 Hypertensive retinopathy, bilateral: Secondary | ICD-10-CM | POA: Diagnosis not present

## 2022-08-26 DIAGNOSIS — E113513 Type 2 diabetes mellitus with proliferative diabetic retinopathy with macular edema, bilateral: Secondary | ICD-10-CM

## 2022-08-26 DIAGNOSIS — I10 Essential (primary) hypertension: Secondary | ICD-10-CM | POA: Diagnosis not present

## 2022-08-26 DIAGNOSIS — H43813 Vitreous degeneration, bilateral: Secondary | ICD-10-CM

## 2022-08-31 ENCOUNTER — Other Ambulatory Visit: Payer: Self-pay | Admitting: Family Medicine

## 2022-09-09 ENCOUNTER — Ambulatory Visit: Payer: PPO | Admitting: Family Medicine

## 2022-09-10 ENCOUNTER — Telehealth: Payer: Self-pay | Admitting: *Deleted

## 2022-09-10 MED ORDER — ASPIRIN 81 MG PO TBEC: 81.0000 mg | DELAYED_RELEASE_TABLET | Freq: Every day | ORAL | Status: AC

## 2022-09-10 NOTE — Telephone Encounter (Signed)
Lesle Chris, LPN 1/61/0960  4:54 AM EDT Back to Top    Notified, copy to pcp.

## 2022-09-10 NOTE — Telephone Encounter (Signed)
-----   Message from Marjo Bicker, MD sent at 09/05/2022  2:10 PM EDT ----- Coronary calcium score is 1267 (more than 400 is considered to be significant). Patient has severe calcifications in his heart vessels. This could mean blockages versus pure calcium in his heart vessels. Patient does not have any symptoms of angina or DOE during the recent office visit. Will see him back in 1 year and evaluate for any symptoms of angina and perform stress testing if required. Restart aspirin 81 mg once daily due to elevated calcium score and continue moderate intensity statin.

## 2022-09-12 ENCOUNTER — Ambulatory Visit: Payer: PPO | Admitting: Family Medicine

## 2022-09-13 DIAGNOSIS — M79676 Pain in unspecified toe(s): Secondary | ICD-10-CM | POA: Diagnosis not present

## 2022-09-13 DIAGNOSIS — L03031 Cellulitis of right toe: Secondary | ICD-10-CM | POA: Diagnosis not present

## 2022-09-23 ENCOUNTER — Encounter (INDEPENDENT_AMBULATORY_CARE_PROVIDER_SITE_OTHER): Payer: PPO | Admitting: Ophthalmology

## 2022-09-23 DIAGNOSIS — H35033 Hypertensive retinopathy, bilateral: Secondary | ICD-10-CM | POA: Diagnosis not present

## 2022-09-23 DIAGNOSIS — H43813 Vitreous degeneration, bilateral: Secondary | ICD-10-CM

## 2022-09-23 DIAGNOSIS — I10 Essential (primary) hypertension: Secondary | ICD-10-CM | POA: Diagnosis not present

## 2022-09-23 DIAGNOSIS — E113513 Type 2 diabetes mellitus with proliferative diabetic retinopathy with macular edema, bilateral: Secondary | ICD-10-CM | POA: Diagnosis not present

## 2022-09-23 DIAGNOSIS — Z7984 Long term (current) use of oral hypoglycemic drugs: Secondary | ICD-10-CM | POA: Diagnosis not present

## 2022-09-30 DIAGNOSIS — M79674 Pain in right toe(s): Secondary | ICD-10-CM | POA: Diagnosis not present

## 2022-09-30 DIAGNOSIS — L03031 Cellulitis of right toe: Secondary | ICD-10-CM | POA: Diagnosis not present

## 2022-10-14 ENCOUNTER — Ambulatory Visit (INDEPENDENT_AMBULATORY_CARE_PROVIDER_SITE_OTHER): Payer: PPO | Admitting: Family Medicine

## 2022-10-14 ENCOUNTER — Encounter: Payer: Self-pay | Admitting: Family Medicine

## 2022-10-14 VITALS — BP 134/77 | HR 68 | Temp 97.3°F | Ht 63.5 in | Wt 173.8 lb

## 2022-10-14 DIAGNOSIS — N401 Enlarged prostate with lower urinary tract symptoms: Secondary | ICD-10-CM

## 2022-10-14 DIAGNOSIS — Z7984 Long term (current) use of oral hypoglycemic drugs: Secondary | ICD-10-CM

## 2022-10-14 DIAGNOSIS — E119 Type 2 diabetes mellitus without complications: Secondary | ICD-10-CM

## 2022-10-14 DIAGNOSIS — E782 Mixed hyperlipidemia: Secondary | ICD-10-CM | POA: Diagnosis not present

## 2022-10-14 DIAGNOSIS — I10 Essential (primary) hypertension: Secondary | ICD-10-CM | POA: Diagnosis not present

## 2022-10-14 LAB — CBC WITH DIFFERENTIAL/PLATELET
Basophils Absolute: 0.1 10*3/uL (ref 0.0–0.2)
Basos: 1 %
EOS (ABSOLUTE): 0.2 10*3/uL (ref 0.0–0.4)
Eos: 3 %
Hematocrit: 43.4 % (ref 37.5–51.0)
Hemoglobin: 14.3 g/dL (ref 13.0–17.7)
Immature Grans (Abs): 0 10*3/uL (ref 0.0–0.1)
Immature Granulocytes: 0 %
Lymphocytes Absolute: 2.4 10*3/uL (ref 0.7–3.1)
Lymphs: 35 %
MCH: 30.4 pg (ref 26.6–33.0)
MCHC: 32.9 g/dL (ref 31.5–35.7)
MCV: 92 fL (ref 79–97)
Monocytes Absolute: 0.7 10*3/uL (ref 0.1–0.9)
Monocytes: 10 %
Neutrophils Absolute: 3.6 10*3/uL (ref 1.4–7.0)
Neutrophils: 51 %
Platelets: 192 10*3/uL (ref 150–450)
RBC: 4.71 x10E6/uL (ref 4.14–5.80)
RDW: 12.8 % (ref 11.6–15.4)
WBC: 7 10*3/uL (ref 3.4–10.8)

## 2022-10-14 LAB — CMP14+EGFR
ALT: 23 IU/L (ref 0–44)
AST: 18 IU/L (ref 0–40)
Albumin/Globulin Ratio: 1.8 (ref 1.2–2.2)
Albumin: 4.1 g/dL (ref 3.7–4.7)
Alkaline Phosphatase: 75 IU/L (ref 44–121)
BUN/Creatinine Ratio: 17 (ref 10–24)
BUN: 26 mg/dL (ref 8–27)
Bilirubin Total: 0.8 mg/dL (ref 0.0–1.2)
CO2: 23 mmol/L (ref 20–29)
Calcium: 8.9 mg/dL (ref 8.6–10.2)
Chloride: 102 mmol/L (ref 96–106)
Creatinine, Ser: 1.56 mg/dL — ABNORMAL HIGH (ref 0.76–1.27)
Globulin, Total: 2.3 g/dL (ref 1.5–4.5)
Glucose: 117 mg/dL — ABNORMAL HIGH (ref 70–99)
Potassium: 5.5 mmol/L — ABNORMAL HIGH (ref 3.5–5.2)
Sodium: 138 mmol/L (ref 134–144)
Total Protein: 6.4 g/dL (ref 6.0–8.5)
eGFR: 44 mL/min/{1.73_m2} — ABNORMAL LOW (ref >59)

## 2022-10-14 LAB — LIPID PANEL
Chol/HDL Ratio: 2.6 ratio (ref 0.0–5.0)
Cholesterol, Total: 145 mg/dL (ref 100–199)
HDL: 56 mg/dL (ref >39)
LDL Chol Calc (NIH): 65 mg/dL (ref 0–99)
Triglycerides: 140 mg/dL (ref 0–149)
VLDL Cholesterol Cal: 24 mg/dL (ref 5–40)

## 2022-10-14 LAB — BAYER DCA HB A1C WAIVED: HB A1C (BAYER DCA - WAIVED): 6.4 % — ABNORMAL HIGH (ref 4.8–5.6)

## 2022-10-14 MED ORDER — ATORVASTATIN CALCIUM 20 MG PO TABS: 20.0000 mg | ORAL_TABLET | Freq: Every day | ORAL | 1 refills | Status: DC

## 2022-10-14 MED ORDER — OLMESARTAN MEDOXOMIL 40 MG PO TABS: 40.0000 mg | ORAL_TABLET | Freq: Every day | ORAL | 3 refills | Status: DC

## 2022-10-14 MED ORDER — DOXYCYCLINE HYCLATE 100 MG PO CAPS
100.0000 mg | ORAL_CAPSULE | Freq: Two times a day (BID) | ORAL | 0 refills | Status: DC
Start: 2022-10-14 — End: 2023-01-16

## 2022-10-14 MED ORDER — FINASTERIDE 5 MG PO TABS: 5.0000 mg | ORAL_TABLET | Freq: Every day | ORAL | 3 refills | Status: DC

## 2022-10-14 MED ORDER — METFORMIN HCL 1000 MG PO TABS: 1000.0000 mg | ORAL_TABLET | Freq: Every day | ORAL | 3 refills | Status: DC

## 2022-10-14 MED ORDER — NIFEDIPINE ER 30 MG PO TB24: 30.0000 mg | ORAL_TABLET | Freq: Every day | ORAL | 1 refills | Status: DC

## 2022-10-14 NOTE — Progress Notes (Signed)
90 130  Subjective:  Patient ID: Jared Norman,  male    DOB: 03/05/1941  Age: 82 y.o.    CC: Medical Management of Chronic Issues   HPI Jared Norman presents for  follow-up of hypertension. Patient has no history of headache chest pain or shortness of breath or recent cough. Patient also denies symptoms of TIA such as numbness weakness lateralizing. Patient denies side effects from medication. States taking it regularly.  Patient also  in for follow-up of elevated cholesterol. Doing well without complaints on current medication. Denies side effects  including myalgia and arthralgia and nausea. Also in today for liver function testing. Currently no chest pain, shortness of breath or other cardiovascular related symptoms noted.  Follow-up of diabetes. Patient does check blood sugar at home. Readings run between 90 and 140 Patient denies symptoms such as excessive hunger or urinary frequency, excessive hunger, nausea No significant hypoglycemic spells noted. Medications reviewed. Pt reports taking them regularly. Pt. denies complication/adverse reaction today.    History Jared Norman has a past medical history of Arthritis, BPH (benign prostatic hyperplasia), Cataract, Gallstones, Hyperlipidemia, Hypertension, Kidney stones, Type 2 diabetes mellitus ([facility]), and Vitamin D deficiency.   Jared Norman has a past surgical history that includes Cystoscopy with retrograde pyelogram, ureteroscopy and stent placement (Left, 12/20/2013); Foot surgery (Right, 2008); Laparoscopic cholecystectomy (05/2018); and Cataract extraction, bilateral.   His family history includes ALS in his brother; Bladder Cancer in his father; Blindness in his father and paternal grandfather; Diabetes in his brother; Hypertension in his brother.Jared Norman reports that Jared Norman has never smoked. Jared Norman has never used smokeless tobacco. Jared Norman reports that Jared Norman does not drink alcohol and does not use drugs.  Current Outpatient Medications on File Prior to Visit   Medication Sig Dispense Refill   aspirin EC 81 MG tablet Take 1 tablet (81 mg total) by mouth daily.     blood glucose meter kit and supplies Dispense based on patient and insurance preference. Use up to four times daily as directed. (FOR ICD-10 E10.9, E11.9). 1 each 0   cholecalciferol (VITAMIN D) 1000 UNITS tablet Take 2,000 Units by mouth daily.     glucose blood (ONETOUCH VERIO) test strip Check glucose up to 4 times daily Dx E11.9 400 each 3   magnesium gluconate (MAGONATE) 500 MG tablet Take 1,000 mg by mouth daily.      meloxicam (MOBIC) 15 MG tablet Take 1 tablet by mouth once daily 90 tablet 3   OneTouch Delica Lancets 30G MISC USE 1  TO CHECK GLUCOSE 4 TIMES DAILY 100 each 11   pantoprazole (PROTONIX) 20 MG tablet Take 1 tablet (20 mg total) by mouth daily. 90 tablet 2   No current facility-administered medications on file prior to visit.    ROS Review of Systems  Constitutional:  Negative for fever.  Respiratory:  Negative for shortness of breath.   Cardiovascular:  Negative for chest pain.  Musculoskeletal:  Negative for arthralgias.  Skin:  Negative for rash.    Objective:  BP 134/77   Pulse 68   Temp (!) 97.3 F (36.3 C)   Ht 5' 3.5" (1.613 m)   Wt 173 lb 12.8 oz (78.8 kg)   SpO2 97%   BMI 30.30 kg/m   BP Readings from Last 3 Encounters:  10/14/22 134/77  06/17/22 126/64  06/13/22 (!) 146/80    Wt Readings from Last 3 Encounters:  10/14/22 173 lb 12.8 oz (78.8 kg)  06/17/22 176 lb 3.2 oz (79.9 kg)  06/13/22 174 lb (78.9 kg)     Physical Exam Vitals reviewed.  Constitutional:      Appearance: Jared Norman is well-developed.  HENT:     Head: Normocephalic and atraumatic.     Right Ear: External ear normal.     Left Ear: External ear normal.     Mouth/Throat:     Pharynx: No oropharyngeal exudate or posterior oropharyngeal erythema.  Eyes:     Pupils: Pupils are equal, round, and reactive to light.  Cardiovascular:     Rate and Rhythm: Normal rate and  regular rhythm.     Heart sounds: No murmur heard. Pulmonary:     Effort: No respiratory distress.     Breath sounds: Normal breath sounds.  Musculoskeletal:     Cervical back: Normal range of motion and neck supple.  Neurological:     Mental Status: Jared Norman is alert and oriented to person, place, and time.     Diabetic Foot Exam - Simple   No data filed     Lab Results  Component Value Date   HGBA1C 6.4 (H) 10/14/2022   HGBA1C 6.8 (H) 06/13/2022   HGBA1C 6.7 (H) 03/11/2022    Assessment & Plan:   Jared Norman was seen today for medical management of chronic issues.  Diagnoses and all orders for this visit:  Primary hypertension -     CBC with Differential/Platelet -     CMP14+EGFR  Diabetes mellitus without complication ([facility]) -     Bayer DCA Hb A1c Waived -     metFORMIN (GLUCOPHAGE) 1000 MG tablet; Take 1 tablet (1,000 mg total) by mouth daily with breakfast.  Mixed hyperlipidemia -     Lipid panel  Benign prostatic hyperplasia with lower urinary tract symptoms, symptom details unspecified -     finasteride (PROSCAR) 5 MG tablet; Take 1 tablet (5 mg total) by mouth daily.  Other orders -     atorvastatin (LIPITOR) 20 MG tablet; Take 1 tablet (20 mg total) by mouth daily. for cholesterol. -     NIFEdipine (ADALAT CC) 30 MG 24 hr tablet; Take 1 tablet (30 mg total) by mouth daily. for blood pressure -     olmesartan (BENICAR) 40 MG tablet; Take 1 tablet (40 mg total) by mouth daily. for blood pressure -     doxycycline (VIBRAMYCIN) 100 MG capsule; Take 1 capsule (100 mg total) by mouth 2 (two) times daily.   I have changed Jared Norman's atorvastatin. I am also having him start on doxycycline. Additionally, I am having him maintain his magnesium gluconate, cholecalciferol, OneTouch Verio, OneTouch Delica Lancets 30G, blood glucose meter kit and supplies, meloxicam, pantoprazole, aspirin EC, finasteride, metFORMIN, NIFEdipine, and olmesartan.  Meds ordered this encounter   Medications   atorvastatin (LIPITOR) 20 MG tablet    Sig: Take 1 tablet (20 mg total) by mouth daily. for cholesterol.    Dispense:  90 tablet    Refill:  1   finasteride (PROSCAR) 5 MG tablet    Sig: Take 1 tablet (5 mg total) by mouth daily.    Dispense:  90 tablet    Refill:  3   metFORMIN (GLUCOPHAGE) 1000 MG tablet    Sig: Take 1 tablet (1,000 mg total) by mouth daily with breakfast.    Dispense:  90 tablet    Refill:  3   NIFEdipine (ADALAT CC) 30 MG 24 hr tablet    Sig: Take 1 tablet (30 mg total) by mouth daily. for blood pressure  Dispense:  90 tablet    Refill:  1   olmesartan (BENICAR) 40 MG tablet    Sig: Take 1 tablet (40 mg total) by mouth daily. for blood pressure    Dispense:  90 tablet    Refill:  3   doxycycline (VIBRAMYCIN) 100 MG capsule    Sig: Take 1 capsule (100 mg total) by mouth 2 (two) times daily.    Dispense:  20 capsule    Refill:  0     Follow-up: No follow-ups on file.  Mechele Claude, M.D.

## 2022-10-22 ENCOUNTER — Encounter (INDEPENDENT_AMBULATORY_CARE_PROVIDER_SITE_OTHER): Payer: PPO | Admitting: Ophthalmology

## 2022-10-22 DIAGNOSIS — E113513 Type 2 diabetes mellitus with proliferative diabetic retinopathy with macular edema, bilateral: Secondary | ICD-10-CM

## 2022-10-22 DIAGNOSIS — I10 Essential (primary) hypertension: Secondary | ICD-10-CM | POA: Diagnosis not present

## 2022-10-22 DIAGNOSIS — Z7984 Long term (current) use of oral hypoglycemic drugs: Secondary | ICD-10-CM | POA: Diagnosis not present

## 2022-10-22 DIAGNOSIS — H35033 Hypertensive retinopathy, bilateral: Secondary | ICD-10-CM

## 2022-10-22 DIAGNOSIS — H43813 Vitreous degeneration, bilateral: Secondary | ICD-10-CM

## 2022-10-23 ENCOUNTER — Encounter (INDEPENDENT_AMBULATORY_CARE_PROVIDER_SITE_OTHER): Payer: PPO | Admitting: Ophthalmology

## 2022-11-06 DIAGNOSIS — E1142 Type 2 diabetes mellitus with diabetic polyneuropathy: Secondary | ICD-10-CM | POA: Diagnosis not present

## 2022-11-06 DIAGNOSIS — M79676 Pain in unspecified toe(s): Secondary | ICD-10-CM | POA: Diagnosis not present

## 2022-11-06 DIAGNOSIS — L84 Corns and callosities: Secondary | ICD-10-CM | POA: Diagnosis not present

## 2022-11-06 DIAGNOSIS — B351 Tinea unguium: Secondary | ICD-10-CM | POA: Diagnosis not present

## 2022-11-11 DIAGNOSIS — H6123 Impacted cerumen, bilateral: Secondary | ICD-10-CM | POA: Diagnosis not present

## 2022-11-19 ENCOUNTER — Encounter (INDEPENDENT_AMBULATORY_CARE_PROVIDER_SITE_OTHER): Payer: PPO | Admitting: Ophthalmology

## 2022-11-20 ENCOUNTER — Encounter (INDEPENDENT_AMBULATORY_CARE_PROVIDER_SITE_OTHER): Payer: PPO | Admitting: Ophthalmology

## 2022-11-20 DIAGNOSIS — I10 Essential (primary) hypertension: Secondary | ICD-10-CM

## 2022-11-20 DIAGNOSIS — E113513 Type 2 diabetes mellitus with proliferative diabetic retinopathy with macular edema, bilateral: Secondary | ICD-10-CM | POA: Diagnosis not present

## 2022-11-20 DIAGNOSIS — H35033 Hypertensive retinopathy, bilateral: Secondary | ICD-10-CM | POA: Diagnosis not present

## 2022-11-20 DIAGNOSIS — Z7984 Long term (current) use of oral hypoglycemic drugs: Secondary | ICD-10-CM

## 2022-11-20 DIAGNOSIS — H43813 Vitreous degeneration, bilateral: Secondary | ICD-10-CM | POA: Diagnosis not present

## 2022-12-16 ENCOUNTER — Other Ambulatory Visit: Payer: Self-pay | Admitting: Family Medicine

## 2022-12-16 MED ORDER — BLOOD GLUCOSE TEST VI STRP: 1.0000 | ORAL_STRIP | Freq: Three times a day (TID) | 0 refills | Status: AC

## 2022-12-16 MED ORDER — LANCETS MISC. MISC: 1.0000 | Freq: Three times a day (TID) | 0 refills | Status: AC

## 2022-12-16 MED ORDER — LANCET DEVICE MISC: 1.0000 | Freq: Three times a day (TID) | 0 refills | Status: AC

## 2022-12-16 MED ORDER — BLOOD GLUCOSE MONITORING SUPPL DEVI
1.0000 | Freq: Three times a day (TID) | 0 refills | Status: DC
Start: 2022-12-16 — End: 2023-01-10

## 2022-12-18 ENCOUNTER — Encounter (INDEPENDENT_AMBULATORY_CARE_PROVIDER_SITE_OTHER): Payer: PPO | Admitting: Ophthalmology

## 2022-12-18 DIAGNOSIS — I10 Essential (primary) hypertension: Secondary | ICD-10-CM

## 2022-12-18 DIAGNOSIS — H35033 Hypertensive retinopathy, bilateral: Secondary | ICD-10-CM

## 2022-12-18 DIAGNOSIS — E113513 Type 2 diabetes mellitus with proliferative diabetic retinopathy with macular edema, bilateral: Secondary | ICD-10-CM

## 2022-12-18 DIAGNOSIS — Z7984 Long term (current) use of oral hypoglycemic drugs: Secondary | ICD-10-CM

## 2022-12-18 DIAGNOSIS — H43813 Vitreous degeneration, bilateral: Secondary | ICD-10-CM | POA: Diagnosis not present

## 2023-01-10 ENCOUNTER — Other Ambulatory Visit: Payer: Self-pay | Admitting: Family Medicine

## 2023-01-15 ENCOUNTER — Encounter (INDEPENDENT_AMBULATORY_CARE_PROVIDER_SITE_OTHER): Payer: PPO | Admitting: Ophthalmology

## 2023-01-15 ENCOUNTER — Ambulatory Visit: Payer: PPO | Admitting: Family Medicine

## 2023-01-15 DIAGNOSIS — I10 Essential (primary) hypertension: Secondary | ICD-10-CM | POA: Diagnosis not present

## 2023-01-15 DIAGNOSIS — E113513 Type 2 diabetes mellitus with proliferative diabetic retinopathy with macular edema, bilateral: Secondary | ICD-10-CM | POA: Diagnosis not present

## 2023-01-15 DIAGNOSIS — H35033 Hypertensive retinopathy, bilateral: Secondary | ICD-10-CM | POA: Diagnosis not present

## 2023-01-15 DIAGNOSIS — Z7984 Long term (current) use of oral hypoglycemic drugs: Secondary | ICD-10-CM | POA: Diagnosis not present

## 2023-01-15 DIAGNOSIS — E1142 Type 2 diabetes mellitus with diabetic polyneuropathy: Secondary | ICD-10-CM | POA: Diagnosis not present

## 2023-01-15 DIAGNOSIS — M79676 Pain in unspecified toe(s): Secondary | ICD-10-CM | POA: Diagnosis not present

## 2023-01-15 DIAGNOSIS — H43813 Vitreous degeneration, bilateral: Secondary | ICD-10-CM

## 2023-01-15 DIAGNOSIS — L84 Corns and callosities: Secondary | ICD-10-CM | POA: Diagnosis not present

## 2023-01-15 DIAGNOSIS — B351 Tinea unguium: Secondary | ICD-10-CM | POA: Diagnosis not present

## 2023-01-16 ENCOUNTER — Ambulatory Visit (INDEPENDENT_AMBULATORY_CARE_PROVIDER_SITE_OTHER): Payer: PPO | Admitting: Family Medicine

## 2023-01-16 ENCOUNTER — Encounter: Payer: Self-pay | Admitting: Family Medicine

## 2023-01-16 VITALS — BP 137/81 | HR 86 | Temp 97.6°F | Ht 62.5 in | Wt 168.6 lb

## 2023-01-16 DIAGNOSIS — E782 Mixed hyperlipidemia: Secondary | ICD-10-CM | POA: Diagnosis not present

## 2023-01-16 DIAGNOSIS — I10 Essential (primary) hypertension: Secondary | ICD-10-CM | POA: Diagnosis not present

## 2023-01-16 DIAGNOSIS — E119 Type 2 diabetes mellitus without complications: Secondary | ICD-10-CM | POA: Diagnosis not present

## 2023-01-16 DIAGNOSIS — Z7984 Long term (current) use of oral hypoglycemic drugs: Secondary | ICD-10-CM | POA: Diagnosis not present

## 2023-01-16 LAB — CBC WITH DIFFERENTIAL/PLATELET
Basophils Absolute: 0.1 10*3/uL (ref 0.0–0.2)
Basos: 1 %
EOS (ABSOLUTE): 0.1 10*3/uL (ref 0.0–0.4)
Eos: 2 %
Hematocrit: 41.7 % (ref 37.5–51.0)
Hemoglobin: 14.2 g/dL (ref 13.0–17.7)
Immature Grans (Abs): 0 10*3/uL (ref 0.0–0.1)
Immature Granulocytes: 0 %
Lymphocytes Absolute: 1.9 10*3/uL (ref 0.7–3.1)
Lymphs: 30 %
MCH: 30.5 pg (ref 26.6–33.0)
MCHC: 34.1 g/dL (ref 31.5–35.7)
MCV: 90 fL (ref 79–97)
Monocytes Absolute: 0.7 10*3/uL (ref 0.1–0.9)
Monocytes: 10 %
Neutrophils Absolute: 3.6 10*3/uL (ref 1.4–7.0)
Neutrophils: 57 %
Platelets: 204 10*3/uL (ref 150–450)
RBC: 4.65 x10E6/uL (ref 4.14–5.80)
RDW: 12.9 % (ref 11.6–15.4)
WBC: 6.3 10*3/uL (ref 3.4–10.8)

## 2023-01-16 LAB — LIPID PANEL
Chol/HDL Ratio: 2.1 ratio (ref 0.0–5.0)
Cholesterol, Total: 118 mg/dL (ref 100–199)
HDL: 57 mg/dL (ref >39)
LDL Chol Calc (NIH): 46 mg/dL (ref 0–99)
Triglycerides: 72 mg/dL (ref 0–149)
VLDL Cholesterol Cal: 15 mg/dL (ref 5–40)

## 2023-01-16 LAB — CMP14+EGFR
ALT: 28 IU/L (ref 0–44)
AST: 24 IU/L (ref 0–40)
Albumin: 4.2 g/dL (ref 3.7–4.7)
Alkaline Phosphatase: 73 IU/L (ref 44–121)
BUN/Creatinine Ratio: 19 (ref 10–24)
BUN: 26 mg/dL (ref 8–27)
Bilirubin Total: 1.3 mg/dL — ABNORMAL HIGH (ref 0.0–1.2)
CO2: 24 mmol/L (ref 20–29)
Calcium: 9.5 mg/dL (ref 8.6–10.2)
Chloride: 102 mmol/L (ref 96–106)
Creatinine, Ser: 1.37 mg/dL — ABNORMAL HIGH (ref 0.76–1.27)
Globulin, Total: 2.2 g/dL (ref 1.5–4.5)
Glucose: 115 mg/dL — ABNORMAL HIGH (ref 70–99)
Potassium: 4.8 mmol/L (ref 3.5–5.2)
Sodium: 139 mmol/L (ref 134–144)
Total Protein: 6.4 g/dL (ref 6.0–8.5)
eGFR: 51 mL/min/{1.73_m2} — ABNORMAL LOW (ref >59)

## 2023-01-16 LAB — BAYER DCA HB A1C WAIVED: HB A1C (BAYER DCA - WAIVED): 6.4 % — ABNORMAL HIGH (ref 4.8–5.6)

## 2023-01-16 NOTE — Progress Notes (Signed)
Subjective:  Patient ID: Jared Norman,  male    DOB: [birth year]  Age: 82 y.o.    CC: Medical Management of Chronic Issues   HPI BAMIDELE VILLASENOR presents for  follow-up of hypertension. Patient has no history of headache chest pain or shortness of breath or recent cough. Patient also denies symptoms of TIA such as numbness weakness lateralizing. Patient denies side effects from medication. States taking it regularly.  Patient also  in for follow-up of elevated cholesterol. Doing well without complaints on current medication. Denies side effects  including myalgia and arthralgia and nausea. Also in today for liver function testing. Currently no chest pain, shortness of breath or other cardiovascular related symptoms noted.  Follow-up of diabetes. Patient does check blood sugar at home. Readings run between 90 and 140 Patient denies symptoms such as excessive hunger or urinary frequency, excessive hunger, nausea No significant hypoglycemic spells noted. Medications reviewed. Pt reports taking them regularly. Pt. denies complication/adverse reaction today.    History Temitope has a past medical history of Arthritis, BPH (benign prostatic hyperplasia), Cataract, Gallstones, Hyperlipidemia, Hypertension, Kidney stones, Type 2 diabetes mellitus (HCC), and Vitamin D deficiency.   He has a past surgical history that includes Cystoscopy with retrograde pyelogram, ureteroscopy and stent placement (Left, 12/20/2013); Foot surgery (Right, 2008); Laparoscopic cholecystectomy (05/2018); and Cataract extraction, bilateral.   His family history includes ALS in his brother; Bladder Cancer in his father; Blindness in his father and paternal grandfather; Diabetes in his brother; Hypertension in his brother.He reports that he has never smoked. He has never used smokeless tobacco. He reports that he does not drink alcohol and does not use drugs.  Current Outpatient Medications on File Prior to Visit   Medication Sig Dispense Refill   aspirin EC 81 MG tablet Take 1 tablet (81 mg total) by mouth daily.     atorvastatin (LIPITOR) 20 MG tablet Take 1 tablet (20 mg total) by mouth daily. for cholesterol. 90 tablet 1   blood glucose meter kit and supplies Dispense based on patient and insurance preference. Use up to four times daily as directed. (FOR ICD-10 E10.9, E11.9). 1 each 0   Blood Glucose Monitoring Suppl (ONETOUCH VERIO FLEX SYSTEM) w/Device KIT USE TO CHECK GLUCOSE IN THE MORNING, AT NOON, AND AT BEDTIME 1 kit 0   cholecalciferol (VITAMIN D) 1000 UNITS tablet Take 2,000 Units by mouth daily.     finasteride (PROSCAR) 5 MG tablet Take 1 tablet (5 mg total) by mouth daily. 90 tablet 3   magnesium gluconate (MAGONATE) 500 MG tablet Take 1,000 mg by mouth daily.      meloxicam (MOBIC) 15 MG tablet Take 1 tablet by mouth once daily 90 tablet 3   metFORMIN (GLUCOPHAGE) 1000 MG tablet Take 1 tablet (1,000 mg total) by mouth daily with breakfast. 90 tablet 3   NIFEdipine (ADALAT CC) 30 MG 24 hr tablet Take 1 tablet (30 mg total) by mouth daily. for blood pressure 90 tablet 1   olmesartan (BENICAR) 40 MG tablet Take 1 tablet (40 mg total) by mouth daily. for blood pressure 90 tablet 3   OneTouch Delica Lancets 30G MISC USE 1  TO CHECK GLUCOSE 4 TIMES DAILY 100 each 11   pantoprazole (PROTONIX) 20 MG tablet Take 1 tablet (20 mg total) by mouth daily. 90 tablet 2   No current facility-administered medications on file prior to visit.    ROS Review of Systems  Constitutional:  Negative for fever.  Respiratory:  Negative for shortness of breath.   Cardiovascular:  Negative for chest pain.  Musculoskeletal:  Negative for arthralgias.  Skin:  Negative for rash.    Objective:  BP 137/81   Pulse 86   Temp 97.6 F (36.4 C)   Ht 5' 2.5" (1.588 m)   Wt 168 lb 9.6 oz (76.5 kg)   SpO2 96%   BMI 30.35 kg/m   BP Readings from Last 3 Encounters:  01/16/23 137/81  10/14/22 134/77  06/17/22  126/64    Wt Readings from Last 3 Encounters:  01/16/23 168 lb 9.6 oz (76.5 kg)  10/14/22 173 lb 12.8 oz (78.8 kg)  06/17/22 176 lb 3.2 oz (79.9 kg)     Physical Exam Vitals reviewed.  Constitutional:      Appearance: He is well-developed.  HENT:     Head: Normocephalic and atraumatic.     Right Ear: External ear normal.     Left Ear: External ear normal.     Mouth/Throat:     Pharynx: No oropharyngeal exudate or posterior oropharyngeal erythema.  Eyes:     Pupils: Pupils are equal, round, and reactive to light.  Cardiovascular:     Rate and Rhythm: Normal rate and regular rhythm.     Heart sounds: No murmur heard. Pulmonary:     Effort: No respiratory distress.     Breath sounds: Normal breath sounds.  Musculoskeletal:     Cervical back: Normal range of motion and neck supple.  Neurological:     Mental Status: He is alert and oriented to person, place, and time.     Diabetic Foot Exam - Simple   No data filed     Lab Results  Component Value Date   HGBA1C 6.4 (H) 01/16/2023   HGBA1C 6.4 (H) 10/14/2022   HGBA1C 6.8 (H) 06/13/2022    Assessment & Plan:   Hayven was seen today for medical management of chronic issues.  Diagnoses and all orders for this visit:  Diabetes mellitus without complication (HCC) -     Bayer DCA Hb A1c Waived  Primary hypertension -     CBC with Differential/Platelet -     CMP14+EGFR  Mixed hyperlipidemia -     Lipid panel   I have discontinued Aylan L. Hackel's doxycycline. I am also having him maintain his magnesium gluconate, cholecalciferol, OneTouch Delica Lancets 30G, blood glucose meter kit and supplies, meloxicam, pantoprazole, aspirin EC, atorvastatin, finasteride, metFORMIN, NIFEdipine, olmesartan, and OneTouch Verio Flex System.  No orders of the defined types were placed in this encounter.    Follow-up: Return in about 3 months (around 04/17/2023).  Mechele Claude, M.D.

## 2023-01-18 ENCOUNTER — Encounter: Payer: Self-pay | Admitting: Family Medicine

## 2023-01-20 NOTE — Progress Notes (Signed)
Hello Amandeep,  Your lab result is normal and/or stable.Some minor variations that are not significant are commonly marked abnormal, but do not represent any medical problem for you.  Best regards, Warren Stacks, M.D.

## 2023-01-28 ENCOUNTER — Other Ambulatory Visit: Payer: Self-pay | Admitting: Family Medicine

## 2023-02-08 ENCOUNTER — Other Ambulatory Visit: Payer: Self-pay | Admitting: Family Medicine

## 2023-02-12 ENCOUNTER — Encounter (INDEPENDENT_AMBULATORY_CARE_PROVIDER_SITE_OTHER): Payer: PPO | Admitting: Ophthalmology

## 2023-02-24 ENCOUNTER — Other Ambulatory Visit: Payer: Self-pay | Admitting: Family Medicine

## 2023-02-24 ENCOUNTER — Encounter (INDEPENDENT_AMBULATORY_CARE_PROVIDER_SITE_OTHER): Payer: Medicare HMO | Admitting: Ophthalmology

## 2023-02-24 DIAGNOSIS — Z7984 Long term (current) use of oral hypoglycemic drugs: Secondary | ICD-10-CM

## 2023-02-24 DIAGNOSIS — H43813 Vitreous degeneration, bilateral: Secondary | ICD-10-CM

## 2023-02-24 DIAGNOSIS — I10 Essential (primary) hypertension: Secondary | ICD-10-CM

## 2023-02-24 DIAGNOSIS — E113513 Type 2 diabetes mellitus with proliferative diabetic retinopathy with macular edema, bilateral: Secondary | ICD-10-CM | POA: Diagnosis not present

## 2023-02-24 DIAGNOSIS — H35033 Hypertensive retinopathy, bilateral: Secondary | ICD-10-CM

## 2023-02-24 DIAGNOSIS — M7731 Calcaneal spur, right foot: Secondary | ICD-10-CM

## 2023-03-26 ENCOUNTER — Encounter (INDEPENDENT_AMBULATORY_CARE_PROVIDER_SITE_OTHER): Payer: Medicare HMO | Admitting: Ophthalmology

## 2023-03-26 DIAGNOSIS — H35033 Hypertensive retinopathy, bilateral: Secondary | ICD-10-CM | POA: Diagnosis not present

## 2023-03-26 DIAGNOSIS — Z7984 Long term (current) use of oral hypoglycemic drugs: Secondary | ICD-10-CM

## 2023-03-26 DIAGNOSIS — E113513 Type 2 diabetes mellitus with proliferative diabetic retinopathy with macular edema, bilateral: Secondary | ICD-10-CM

## 2023-03-26 DIAGNOSIS — I10 Essential (primary) hypertension: Secondary | ICD-10-CM

## 2023-03-26 DIAGNOSIS — H43813 Vitreous degeneration, bilateral: Secondary | ICD-10-CM

## 2023-04-17 ENCOUNTER — Ambulatory Visit: Payer: PPO | Admitting: Family Medicine

## 2023-04-17 ENCOUNTER — Encounter: Payer: Self-pay | Admitting: Family Medicine

## 2023-04-17 VITALS — BP 156/83 | HR 79 | Temp 98.1°F | Ht 62.5 in | Wt 172.5 lb

## 2023-04-17 DIAGNOSIS — I10 Essential (primary) hypertension: Secondary | ICD-10-CM

## 2023-04-17 DIAGNOSIS — Z7984 Long term (current) use of oral hypoglycemic drugs: Secondary | ICD-10-CM | POA: Diagnosis not present

## 2023-04-17 DIAGNOSIS — Z23 Encounter for immunization: Secondary | ICD-10-CM | POA: Diagnosis not present

## 2023-04-17 DIAGNOSIS — E119 Type 2 diabetes mellitus without complications: Secondary | ICD-10-CM

## 2023-04-17 DIAGNOSIS — E782 Mixed hyperlipidemia: Secondary | ICD-10-CM

## 2023-04-17 LAB — BAYER DCA HB A1C WAIVED: HB A1C (BAYER DCA - WAIVED): 6.8 % — ABNORMAL HIGH (ref 4.8–5.6)

## 2023-04-17 MED ORDER — PREGABALIN 75 MG PO CAPS: 75.0000 mg | ORAL_CAPSULE | Freq: Every day | ORAL | 1 refills | Status: DC

## 2023-04-17 NOTE — Progress Notes (Signed)
Subjective:  Patient ID: Jared Norman,  male    DOB: Dec 24, 1940  Age: 82 y.o.    CC: Medical Management of Chronic Issues and Diabetes   HPI VARDELL RODOCKER presents for  follow-up of hypertension. Patient has no history of headache chest pain or shortness of breath or recent cough. Patient also denies symptoms of TIA such as numbness weakness lateralizing. Patient denies side effects from medication. States taking it regularly.  Patient also  in for follow-up of elevated cholesterol. Doing well without complaints on current medication. Denies side effects  including myalgia and arthralgia and nausea. Also in today for liver function testing. Currently no chest pain, shortness of breath or other cardiovascular related symptoms noted.  Follow-up of diabetes. Patient does check blood sugar at home. Readings run between 100 and 150 Patient denies symptoms such as excessive hunger or urinary frequency, excessive hunger, nausea No significant hypoglycemic spells noted. Medications reviewed. Pt reports taking them regularly. Pt. denies complication/adverse reaction today.    History Sujit has a past medical history of Arthritis, BPH (benign prostatic hyperplasia), Cataract, Gallstones, Hyperlipidemia, Hypertension, Kidney stones, Type 2 diabetes mellitus (HCC), and Vitamin D deficiency.   He has a past surgical history that includes Cystoscopy with retrograde pyelogram, ureteroscopy and stent placement (Left, 12/20/2013); Foot surgery (Right, 2008); Laparoscopic cholecystectomy (05/2018); and Cataract extraction, bilateral.   His family history includes ALS in his brother; Bladder Cancer in his father; Blindness in his father and paternal grandfather; Diabetes in his brother; Hypertension in his brother.He reports that he has never smoked. He has never used smokeless tobacco. He reports that he does not drink alcohol and does not use drugs.  Current Outpatient Medications on File Prior to  Visit  Medication Sig Dispense Refill   aspirin EC 81 MG tablet Take 1 tablet (81 mg total) by mouth daily.     atorvastatin (LIPITOR) 20 MG tablet TAKE 1 TABLET BY MOUTH ONCE DAILY FOR CHOLESTEROL 90 tablet 0   blood glucose meter kit and supplies Dispense based on patient and insurance preference. Use up to four times daily as directed. (FOR ICD-10 E10.9, E11.9). 1 each 0   Blood Glucose Monitoring Suppl (ONETOUCH VERIO FLEX SYSTEM) w/Device KIT USE TO CHECK GLUCOSE IN THE MORNING, AT NOON, AND AT BEDTIME 1 kit 0   cholecalciferol (VITAMIN D) 1000 UNITS tablet Take 2,000 Units by mouth daily.     finasteride (PROSCAR) 5 MG tablet Take 1 tablet (5 mg total) by mouth daily. 90 tablet 3   magnesium gluconate (MAGONATE) 500 MG tablet Take 1,000 mg by mouth daily.      meloxicam (MOBIC) 15 MG tablet Take 1 tablet by mouth once daily 90 tablet 3   metFORMIN (GLUCOPHAGE) 1000 MG tablet Take 1 tablet (1,000 mg total) by mouth daily with breakfast. 90 tablet 3   NIFEdipine (ADALAT CC) 30 MG 24 hr tablet Take 1 tablet (30 mg total) by mouth daily. for blood pressure 90 tablet 1   olmesartan (BENICAR) 40 MG tablet Take 1 tablet (40 mg total) by mouth daily. for blood pressure 90 tablet 3   OneTouch Delica Lancets 30G MISC USE 1  TO CHECK GLUCOSE 4 TIMES DAILY 100 each 11   pantoprazole (PROTONIX) 20 MG tablet Take 1 tablet (20 mg total) by mouth daily. 90 tablet 2   No current facility-administered medications on file prior to visit.    ROS Review of Systems  Constitutional:  Negative for fever.  Respiratory:  Negative for shortness of breath.   Cardiovascular:  Negative for chest pain.  Musculoskeletal:  Positive for arthralgias (hips hurt with walking, stretching.).  Skin:  Negative for rash.    Objective:  BP (!) 156/83   Pulse 79   Temp 98.1 F (36.7 C) (Temporal)   Ht 5' 2.5" (1.588 m)   Wt 172 lb 8 oz (78.2 kg)   SpO2 98%   BMI 31.05 kg/m   BP Readings from Last 3 Encounters:   04/17/23 (!) 156/83  01/16/23 137/81  10/14/22 134/77    Wt Readings from Last 3 Encounters:  04/17/23 172 lb 8 oz (78.2 kg)  01/16/23 168 lb 9.6 oz (76.5 kg)  10/14/22 173 lb 12.8 oz (78.8 kg)     Physical Exam Vitals reviewed.  Constitutional:      Appearance: He is well-developed.  HENT:     Head: Normocephalic and atraumatic.     Right Ear: External ear normal.     Left Ear: External ear normal.     Mouth/Throat:     Pharynx: No oropharyngeal exudate or posterior oropharyngeal erythema.  Eyes:     Pupils: Pupils are equal, round, and reactive to light.  Cardiovascular:     Rate and Rhythm: Normal rate and regular rhythm.     Heart sounds: No murmur heard. Pulmonary:     Effort: No respiratory distress.     Breath sounds: Normal breath sounds.  Musculoskeletal:     Cervical back: Normal range of motion and neck supple.  Neurological:     Mental Status: He is alert and oriented to person, place, and time.     Diabetic Foot Exam - Simple   Simple Foot Form Diabetic Foot exam was performed with the following findings: Yes 04/17/2023 11:13 AM  Visual Inspection See comments: Yes Sensation Testing See comments: Yes Pulse Check Posterior Tibialis and Dorsalis pulse intact bilaterally: Yes Comments Right foot has hammer toes with callus at top of 1st toe. Skin is dry. Left foot is dry.       Lab Results  Component Value Date   HGBA1C 6.8 (H) 04/17/2023   HGBA1C 6.4 (H) 01/16/2023   HGBA1C 6.4 (H) 10/14/2022    Assessment & Plan:   Daschel was seen today for medical management of chronic issues and diabetes.  Diagnoses and all orders for this visit:  Primary hypertension -     CBC with Differential/Platelet -     CMP14+EGFR  Mixed hyperlipidemia -     Lipid panel  Diabetes mellitus without complication (HCC) -     Microalbumin / creatinine urine ratio -     Vitamin B12 -     Bayer DCA Hb A1c Waived  Encounter for immunization -     Flu Vaccine  Trivalent High Dose (Fluad)  Other orders -     pregabalin (LYRICA) 75 MG capsule; Take 1 capsule (75 mg total) by mouth at bedtime.   I am having Courtland L. Menefee start on pregabalin. I am also having him maintain his magnesium gluconate, cholecalciferol, OneTouch Delica Lancets 30G, blood glucose meter kit and supplies, meloxicam, pantoprazole, aspirin EC, finasteride, metFORMIN, NIFEdipine, olmesartan, OneTouch Verio Flex System, and atorvastatin.  Meds ordered this encounter  Medications   pregabalin (LYRICA) 75 MG capsule    Sig: Take 1 capsule (75 mg total) by mouth at bedtime.    Dispense:  90 capsule    Refill:  1     Follow-up: Return in about 6  weeks (around 05/29/2023).  Mechele Claude, M.D.

## 2023-04-18 ENCOUNTER — Encounter: Payer: Self-pay | Admitting: Family Medicine

## 2023-04-18 LAB — VITAMIN B12: Vitamin B-12: 590 pg/mL (ref 232–1245)

## 2023-04-18 LAB — CBC WITH DIFFERENTIAL/PLATELET
Basophils Absolute: 0.1 10*3/uL (ref 0.0–0.2)
Basos: 1 %
EOS (ABSOLUTE): 0.2 10*3/uL (ref 0.0–0.4)
Eos: 3 %
Hematocrit: 45 % (ref 37.5–51.0)
Hemoglobin: 15.1 g/dL (ref 13.0–17.7)
Immature Grans (Abs): 0 10*3/uL (ref 0.0–0.1)
Immature Granulocytes: 0 %
Lymphocytes Absolute: 2.5 10*3/uL (ref 0.7–3.1)
Lymphs: 29 %
MCH: 31.1 pg (ref 26.6–33.0)
MCHC: 33.6 g/dL (ref 31.5–35.7)
MCV: 93 fL (ref 79–97)
Monocytes Absolute: 0.8 10*3/uL (ref 0.1–0.9)
Monocytes: 10 %
Neutrophils Absolute: 5 10*3/uL (ref 1.4–7.0)
Neutrophils: 57 %
Platelets: 228 10*3/uL (ref 150–450)
RBC: 4.85 x10E6/uL (ref 4.14–5.80)
RDW: 13 % (ref 11.6–15.4)
WBC: 8.6 10*3/uL (ref 3.4–10.8)

## 2023-04-18 LAB — CMP14+EGFR
ALT: 43 [IU]/L (ref 0–44)
AST: 31 [IU]/L (ref 0–40)
Albumin: 4.5 g/dL (ref 3.7–4.7)
Alkaline Phosphatase: 101 [IU]/L (ref 44–121)
BUN/Creatinine Ratio: 20 (ref 10–24)
BUN: 26 mg/dL (ref 8–27)
Bilirubin Total: 1.3 mg/dL — ABNORMAL HIGH (ref 0.0–1.2)
CO2: 24 mmol/L (ref 20–29)
Calcium: 9.6 mg/dL (ref 8.6–10.2)
Chloride: 101 mmol/L (ref 96–106)
Creatinine, Ser: 1.28 mg/dL — ABNORMAL HIGH (ref 0.76–1.27)
Globulin, Total: 2.6 g/dL (ref 1.5–4.5)
Glucose: 129 mg/dL — ABNORMAL HIGH (ref 70–99)
Potassium: 5.2 mmol/L (ref 3.5–5.2)
Sodium: 141 mmol/L (ref 134–144)
Total Protein: 7.1 g/dL (ref 6.0–8.5)
eGFR: 56 mL/min/{1.73_m2} — ABNORMAL LOW (ref >59)

## 2023-04-18 LAB — LIPID PANEL
Chol/HDL Ratio: 2.3 {ratio} (ref 0.0–5.0)
Cholesterol, Total: 150 mg/dL (ref 100–199)
HDL: 65 mg/dL (ref >39)
LDL Chol Calc (NIH): 71 mg/dL (ref 0–99)
Triglycerides: 73 mg/dL (ref 0–149)
VLDL Cholesterol Cal: 14 mg/dL (ref 5–40)

## 2023-04-19 LAB — MICROALBUMIN / CREATININE URINE RATIO
Creatinine, Urine: 81.6 mg/dL
Microalb/Creat Ratio: 19 mg/g{creat} (ref 0–29)
Microalbumin, Urine: 15.2 ug/mL

## 2023-04-23 ENCOUNTER — Encounter (INDEPENDENT_AMBULATORY_CARE_PROVIDER_SITE_OTHER): Payer: Medicare HMO | Admitting: Ophthalmology

## 2023-04-23 DIAGNOSIS — I10 Essential (primary) hypertension: Secondary | ICD-10-CM

## 2023-04-23 DIAGNOSIS — H35033 Hypertensive retinopathy, bilateral: Secondary | ICD-10-CM | POA: Diagnosis not present

## 2023-04-23 DIAGNOSIS — H43813 Vitreous degeneration, bilateral: Secondary | ICD-10-CM

## 2023-04-23 DIAGNOSIS — E113513 Type 2 diabetes mellitus with proliferative diabetic retinopathy with macular edema, bilateral: Secondary | ICD-10-CM | POA: Diagnosis not present

## 2023-04-23 DIAGNOSIS — Z7984 Long term (current) use of oral hypoglycemic drugs: Secondary | ICD-10-CM | POA: Diagnosis not present

## 2023-04-29 ENCOUNTER — Telehealth: Payer: Self-pay | Admitting: Family Medicine

## 2023-04-29 ENCOUNTER — Other Ambulatory Visit: Payer: Self-pay | Admitting: Family Medicine

## 2023-04-29 MED ORDER — PREGABALIN 75 MG PO CAPS: 75.0000 mg | ORAL_CAPSULE | Freq: Every day | ORAL | 1 refills | Status: DC

## 2023-04-29 NOTE — Telephone Encounter (Signed)
Scrip sent to [facility]

## 2023-04-29 NOTE — Telephone Encounter (Signed)
Wife came in to the office stating that she has been trying to call our office for days to let Dr Darlyn Read know that [facility] pharmacy never received pts PREGABALIN Rx and needs Dr Darlyn Read to send Rx in ASAP.

## 2023-05-05 ENCOUNTER — Ambulatory Visit: Payer: Medicare HMO

## 2023-05-05 VITALS — Ht 62.5 in | Wt 172.0 lb

## 2023-05-05 DIAGNOSIS — Z0000 Encounter for general adult medical examination without abnormal findings: Secondary | ICD-10-CM | POA: Diagnosis not present

## 2023-05-05 NOTE — Progress Notes (Signed)
Subjective:   Jared Norman is a 82 y.o. male who presents for Medicare Annual/Subsequent preventive examination.  Visit Complete: Virtual I connected with  Reed Pandy on 05/05/23 by a audio enabled telemedicine application and verified that I am speaking with the correct person using two identifiers.  Patient Location: Home  Provider Location: Home Office  I discussed the limitations of evaluation and management by telemedicine. The patient expressed understanding and agreed to proceed.  Vital Signs: Because this visit was a virtual/telehealth visit, some criteria may be missing or patient reported. Any vitals not documented were not able to be obtained and vitals that have been documented are patient reported.  Cardiac Risk Factors include: advanced age (>20men, >56 women);diabetes mellitus;dyslipidemia;hypertension;male gender     Objective:    Today's Vitals   05/05/23 0934  Weight: 172 lb (78 kg)  Height: 5' 2.5" (1.588 m)   Body mass index is 30.96 kg/m.     05/05/2023    9:41 AM 04/29/2022   10:41 AM 02/13/2021    8:32 AM 02/11/2020    8:39 AM 02/10/2019    8:47 AM 01/22/2019    4:14 PM 01/21/2018    9:11 AM  Advanced Directives  Does Patient Have a Medical Advance Directive? No Yes No No No No No  Type of Advance Directive  Living will;Healthcare Power of Attorney       Does patient want to make changes to medical advance directive?  No - Patient declined       Copy of Healthcare Power of Attorney in Chart?  No - copy requested       Would patient like information on creating a medical advance directive? Yes (MAU/Ambulatory/Procedural Areas - Information given)  No - Patient declined No - Patient declined No - Patient declined No - Patient declined Yes (MAU/Ambulatory/Procedural Areas - Information given)    Current Medications (verified) Outpatient Encounter Medications as of 05/05/2023  Medication Sig   aspirin EC 81 MG tablet Take 1 tablet (81 mg total) by  mouth daily.   atorvastatin (LIPITOR) 20 MG tablet TAKE 1 TABLET BY MOUTH ONCE DAILY FOR CHOLESTEROL   blood glucose meter kit and supplies Dispense based on patient and insurance preference. Use up to four times daily as directed. (FOR ICD-10 E10.9, E11.9).   Blood Glucose Monitoring Suppl (ONETOUCH VERIO FLEX SYSTEM) w/Device KIT USE TO CHECK GLUCOSE IN THE MORNING, AT NOON, AND AT BEDTIME   cholecalciferol (VITAMIN D) 1000 UNITS tablet Take 2,000 Units by mouth daily.   finasteride (PROSCAR) 5 MG tablet Take 1 tablet (5 mg total) by mouth daily.   magnesium gluconate (MAGONATE) 500 MG tablet Take 1,000 mg by mouth daily.    meloxicam (MOBIC) 15 MG tablet Take 1 tablet by mouth once daily   metFORMIN (GLUCOPHAGE) 1000 MG tablet Take 1 tablet (1,000 mg total) by mouth daily with breakfast.   NIFEdipine (ADALAT CC) 30 MG 24 hr tablet Take 1 tablet (30 mg total) by mouth daily. for blood pressure   olmesartan (BENICAR) 40 MG tablet Take 1 tablet (40 mg total) by mouth daily. for blood pressure   OneTouch Delica Lancets 30G MISC USE 1  TO CHECK GLUCOSE 4 TIMES DAILY   pantoprazole (PROTONIX) 20 MG tablet Take 1 tablet (20 mg total) by mouth daily.   pregabalin (LYRICA) 75 MG capsule Take 1 capsule (75 mg total) by mouth at bedtime.   No facility-administered encounter medications on file as of 05/05/2023.  Allergies (verified) Patient has no known allergies.   History: Past Medical History:  Diagnosis Date   Arthritis    BPH (benign prostatic hyperplasia)    Cataract    Gallstones    Hyperlipidemia    Hypertension    Kidney stones    Type 2 diabetes mellitus (HCC)    Vitamin D deficiency    Past Surgical History:  Procedure Laterality Date   CATARACT EXTRACTION, BILATERAL     CYSTOSCOPY WITH RETROGRADE PYELOGRAM, URETEROSCOPY AND STENT PLACEMENT Left 12/20/2013   Procedure: CYSTOSCOPY WITHLEFT RETROGRADE PYELOGRAM,  AND LEFT STENT PLACEMENT;  Surgeon: Anner Crete, MD;  Location:  [facility];  Service: Urology;  Laterality: Left;   FOOT SURGERY Right 2008   Tendon rupture   LAPAROSCOPIC CHOLECYSTECTOMY  05/2018   Dr. Marcha Solders   Family History  Problem Relation Age of Onset   ALS Brother        Agent Orange   Hypertension Brother    Diabetes Brother    Bladder Cancer Father    Blindness Father    Blindness Paternal Grandfather    Social History   Socioeconomic History   Marital status: Married    Spouse name: Junious Dresser   Number of children: 7   Years of education: 12th grade   Highest education level: High school graduate  Occupational History   Occupation: Optician, dispensing   Occupation: Games developer   Occupation: retired  Tobacco Use   Smoking status: Never   Smokeless tobacco: Never  Vaping Use   Vaping status: Never Used  Substance and Sexual Activity   Alcohol use: No   Drug use: No   Sexual activity: Not Currently  Other Topics Concern   Not on file  Social History Narrative   Lives home with his wife   Social Drivers of Corporate investment banker Strain: Low Risk  (05/05/2023)   Overall Financial Resource Strain (CARDIA)    Difficulty of Paying Living Expenses: Not hard at all  Food Insecurity: No Food Insecurity (05/05/2023)   Hunger Vital Sign    Worried About Running Out of Food in the Last Year: Never true    Ran Out of Food in the Last Year: Never true  Transportation Needs: No Transportation Needs (05/05/2023)   PRAPARE - Administrator, Civil Service (Medical): No    Lack of Transportation (Non-Medical): No  Physical Activity: Insufficiently Active (05/05/2023)   Exercise Vital Sign    Days of Exercise per Week: 3 days    Minutes of Exercise per Session: 30 min  Stress: No Stress Concern Present (05/05/2023)   [employer] of Occupational Health - Occupational Stress Questionnaire    Feeling of Stress : Not at all  Social Connections: Socially Integrated (05/05/2023)   Social Connection and Isolation Panel [NHANES]     Frequency of Communication with Friends and Family: More than three times a week    Frequency of Social Gatherings with Friends and Family: Three times a week    Attends Religious Services: More than 4 times per year    Active Member of Clubs or Organizations: Yes    Attends Engineer, structural: More than 4 times per year    Marital Status: Married    Tobacco Counseling Counseling given: Not Answered   Clinical Intake:  Pre-visit preparation completed: Yes  Pain : No/denies pain     Diabetes: No  How often do you need to have someone help you when you read  instructions, pamphlets, or other written materials from your doctor or pharmacy?: 1 - Never  Interpreter Needed?: No  Information entered by :: Kandis Fantasia LPN   Activities of Daily Living    05/05/2023    9:36 AM  In your present state of health, do you have any difficulty performing the following activities:  Hearing? 0  Vision? 0  Difficulty concentrating or making decisions? 0  Walking or climbing stairs? 0  Dressing or bathing? 0  Doing errands, shopping? 0  Preparing Food and eating ? N  Using the Toilet? N  In the past six months, have you accidently leaked urine? N  Do you have problems with loss of bowel control? N  Managing your Medications? N  Managing your Finances? N  Housekeeping or managing your Housekeeping? N    Patient Care Team: Mechele Claude, MD as PCP - General (Family Medicine) Mallipeddi, Orion Modest, MD as PCP - Cardiology (Cardiology) Jena Gauss Gerrit Friends, MD as Consulting Physician (Gastroenterology) Sherrie George, MD as Consulting Physician (Ophthalmology) Marcelino Duster, MD as Referring Physician (Dermatology)  Indicate any recent Medical Services you may have received from other than [facility] providers in the past year (date may be approximate).     Assessment:   This is a routine wellness examination for Eirik.  Hearing/Vision screen Hearing Screening -  Comments:: Denies hearing difficulties   Vision Screening - Comments:: Wears rx glasses - up to date with routine eye exams with Dr. Ashley Royalty     Goals Addressed   None   Depression Screen    05/05/2023    9:37 AM 04/17/2023   11:07 AM 01/16/2023   10:20 AM 10/14/2022   10:21 AM 06/13/2022   10:13 AM 04/29/2022   10:39 AM 03/11/2022    8:42 AM  PHQ 2/9 Scores  PHQ - 2 Score 0 0 0 0 0 0 0  PHQ- 9 Score 0 0     1    Fall Risk    05/05/2023    9:42 AM 04/17/2023   11:06 AM 01/16/2023   10:20 AM 10/14/2022   10:21 AM 06/13/2022   10:13 AM  Fall Risk   Falls in the past year? 0 0 0 0 1  Number falls in past yr: 0    1  Injury with Fall? 0    0  Risk for fall due to : No Fall Risks    Impaired balance/gait;Impaired mobility  Follow up Falls prevention discussed;Education provided;Falls evaluation completed    Falls evaluation completed    MEDICARE RISK AT HOME: Medicare Risk at Home Any stairs in or around the home?: No If so, are there any without handrails?: No Home free of loose throw rugs in walkways, pet beds, electrical cords, etc?: Yes Adequate lighting in your home to reduce risk of falls?: Yes Life alert?: No Use of a cane, walker or w/c?: No Grab bars in the bathroom?: Yes Shower chair or bench in shower?: No Elevated toilet seat or a handicapped toilet?: Yes  TIMED UP AND GO:  Was the test performed?  No    Cognitive Function:    01/21/2018    8:19 AM  MMSE - Mini Mental State Exam  Orientation to time 5  Orientation to Place 5  Registration 3  Attention/ Calculation 5  Recall 3  Language- name 2 objects 2  Language- repeat 1  Language- follow 3 step command 3  Language- read & follow direction 1  Write a sentence 1  Copy design 1  Total score 30        05/05/2023    9:42 AM 04/29/2022   10:42 AM 02/11/2020    8:45 AM 02/10/2019    9:03 AM  6CIT Screen  What Year? 0 points 0 points 0 points 0 points  What month? 0 points 0 points 0 points 0 points   What time? 0 points 0 points 0 points 0 points  Count back from 20 0 points 0 points 0 points 0 points  Months in reverse 0 points 0 points 0 points 0 points  Repeat phrase 0 points 0 points 0 points 0 points  Total Score 0 points 0 points 0 points 0 points    Immunizations Immunization History  Administered Date(s) Administered   Fluad Quad(high Dose 65+) 02/16/2019, 02/16/2020, 01/29/2021, 03/11/2022   Fluad Trivalent(High Dose 65+) 04/17/2023   Influenza, High Dose Seasonal PF 01/21/2018   Influenza,inj,Quad PF,6+ Mos 02/26/2013, 04/19/2014, 02/20/2015, 02/15/2016, 02/07/2017   Janssen (J&J) SARS-COV-2 Vaccination 08/12/2019   Pneumococcal Conjugate-13 11/18/2014   Pneumococcal Polysaccharide-23 05/13/2010   Tdap 12/07/2013   Zoster Recombinant(Shingrix) 01/21/2018, 05/09/2018    TDAP status: Up to date  Flu Vaccine status: Up to date  Pneumococcal vaccine status: Up to date  Covid-19 vaccine status: Information provided on how to obtain vaccines.   Qualifies for Shingles Vaccine? Yes   Zostavax completed No   Shingrix Completed?: Yes  Screening Tests Health Maintenance  Topic Date Due   COVID-19 Vaccine (2 - Janssen risk series) 09/09/2019   OPHTHALMOLOGY EXAM  06/25/2022   HEMOGLOBIN A1C  10/16/2023   DTaP/Tdap/Td (2 - Td or Tdap) 12/08/2023   Diabetic kidney evaluation - eGFR measurement  04/16/2024   Diabetic kidney evaluation - Urine ACR  04/16/2024   FOOT EXAM  04/16/2024   Medicare Annual Wellness (AWV)  05/04/2024   Pneumonia Vaccine 63+ Years old  Completed   INFLUENZA VACCINE  Completed   Zoster Vaccines- Shingrix  Completed   HPV VACCINES  Aged Out   Hepatitis C Screening  Discontinued    Health Maintenance  Health Maintenance Due  Topic Date Due   COVID-19 Vaccine (2 - Janssen risk series) 09/09/2019   OPHTHALMOLOGY EXAM  06/25/2022    Colorectal cancer screening: No longer required.   Lung Cancer Screening: (Low Dose CT Chest recommended  if Age 31-80 years, 20 pack-year currently smoking OR have quit w/in 15years.) does not qualify.   Lung Cancer Screening Referral: n/a  Additional Screening:  Hepatitis C Screening: does not qualify  Vision Screening: Recommended annual ophthalmology exams for early detection of glaucoma and other disorders of the eye. Is the patient up to date with their annual eye exam?  Yes  Who is the provider or what is the name of the office in which the patient attends annual eye exams? Dr. Ashley Royalty  If pt is not established with a provider, would they like to be referred to a provider to establish care? No .   Dental Screening: Recommended annual dental exams for proper oral hygiene  Diabetic Foot Exam: Diabetic Foot Exam: Completed 04/17/23  Community Resource Referral / Chronic Care Management: CRR required this visit?  No   CCM required this visit?  No     Plan:     I have personally reviewed and noted the following in the patient's chart:   Medical and social history Use of alcohol, tobacco or illicit drugs  Current medications and supplements including opioid prescriptions. Patient is not  currently taking opioid prescriptions. Functional ability and status Nutritional status Physical activity Advanced directives List of other physicians Hospitalizations, surgeries, and ER visits in previous 12 months Vitals Screenings to include cognitive, depression, and falls Referrals and appointments  In addition, I have reviewed and discussed with patient certain preventive protocols, quality metrics, and best practice recommendations. A written personalized care plan for preventive services as well as general preventive health recommendations were provided to patient.     Kandis Fantasia [place], [place]   [id number]   After Visit Summary: (MyChart) Due to this being a telephonic visit, the after visit summary with patients personalized plan was offered to patient via MyChart   Nurse  Notes: No concerns at this time

## 2023-05-05 NOTE — Patient Instructions (Signed)
Jared Norman , Thank you for taking time to come for your Medicare Wellness Visit. I appreciate your ongoing commitment to your health goals. Please review the following plan we discussed and let me know if I can assist you in the future.   Referrals/Orders/Follow-Ups/Clinician Recommendations: Aim for 30 minutes of exercise or brisk walking, 6-8 glasses of water, and 5 servings of fruits and vegetables each day.  This is a list of the screening recommended for you and due dates:  Health Maintenance  Topic Date Due   COVID-19 Vaccine (2 - Janssen risk series) 09/09/2019   Eye exam for diabetics  06/25/2022   Hemoglobin A1C  10/16/2023   DTaP/Tdap/Td vaccine (2 - Td or Tdap) 12/08/2023   Yearly kidney function blood test for diabetes  04/16/2024   Yearly kidney health urinalysis for diabetes  04/16/2024   Complete foot exam   04/16/2024   Medicare Annual Wellness Visit  05/04/2024   Pneumonia Vaccine  Completed   Flu Shot  Completed   Zoster (Shingles) Vaccine  Completed   HPV Vaccine  Aged Out   Hepatitis C Screening  Discontinued    Advanced directives: (ACP Link)Information on Advanced Care Planning can be found at [facility] Advance Health Care Directives Advance Health Care Directives ([email or web address]   Next Medicare Annual Wellness Visit scheduled for next year: Yes

## 2023-05-06 ENCOUNTER — Other Ambulatory Visit: Payer: Self-pay | Admitting: Family Medicine

## 2023-05-06 DIAGNOSIS — M7731 Calcaneal spur, right foot: Secondary | ICD-10-CM

## 2023-05-15 ENCOUNTER — Other Ambulatory Visit: Payer: Self-pay | Admitting: Family Medicine

## 2023-05-15 DIAGNOSIS — K219 Gastro-esophageal reflux disease without esophagitis: Secondary | ICD-10-CM

## 2023-05-21 ENCOUNTER — Encounter (INDEPENDENT_AMBULATORY_CARE_PROVIDER_SITE_OTHER): Payer: Medicare HMO | Admitting: Ophthalmology

## 2023-05-21 DIAGNOSIS — I10 Essential (primary) hypertension: Secondary | ICD-10-CM

## 2023-05-21 DIAGNOSIS — E113513 Type 2 diabetes mellitus with proliferative diabetic retinopathy with macular edema, bilateral: Secondary | ICD-10-CM

## 2023-05-21 DIAGNOSIS — Z7984 Long term (current) use of oral hypoglycemic drugs: Secondary | ICD-10-CM

## 2023-05-21 DIAGNOSIS — H43813 Vitreous degeneration, bilateral: Secondary | ICD-10-CM

## 2023-05-21 DIAGNOSIS — H35033 Hypertensive retinopathy, bilateral: Secondary | ICD-10-CM

## 2023-05-29 ENCOUNTER — Ambulatory Visit: Payer: Medicare HMO | Admitting: Family Medicine

## 2023-05-31 ENCOUNTER — Other Ambulatory Visit: Payer: Self-pay | Admitting: Family Medicine

## 2023-06-06 ENCOUNTER — Telehealth: Payer: Self-pay | Admitting: Family Medicine

## 2023-06-10 ENCOUNTER — Ambulatory Visit: Payer: Medicare HMO | Admitting: Family Medicine

## 2023-06-18 ENCOUNTER — Encounter (INDEPENDENT_AMBULATORY_CARE_PROVIDER_SITE_OTHER): Payer: Medicare HMO | Admitting: Ophthalmology

## 2023-06-18 DIAGNOSIS — H35033 Hypertensive retinopathy, bilateral: Secondary | ICD-10-CM

## 2023-06-18 DIAGNOSIS — I10 Essential (primary) hypertension: Secondary | ICD-10-CM | POA: Diagnosis not present

## 2023-06-18 DIAGNOSIS — H43813 Vitreous degeneration, bilateral: Secondary | ICD-10-CM

## 2023-06-18 DIAGNOSIS — E113513 Type 2 diabetes mellitus with proliferative diabetic retinopathy with macular edema, bilateral: Secondary | ICD-10-CM

## 2023-06-18 DIAGNOSIS — Z7984 Long term (current) use of oral hypoglycemic drugs: Secondary | ICD-10-CM

## 2023-06-24 ENCOUNTER — Ambulatory Visit: Payer: Medicare HMO

## 2023-07-08 ENCOUNTER — Other Ambulatory Visit: Payer: Self-pay | Admitting: Family Medicine

## 2023-07-09 ENCOUNTER — Ambulatory Visit (INDEPENDENT_AMBULATORY_CARE_PROVIDER_SITE_OTHER): Payer: Medicare HMO | Admitting: Family Medicine

## 2023-07-09 ENCOUNTER — Encounter: Payer: Self-pay | Admitting: Family Medicine

## 2023-07-09 VITALS — BP 131/82 | HR 77 | Temp 97.2°F | Ht 62.5 in | Wt 176.0 lb

## 2023-07-09 DIAGNOSIS — M25551 Pain in right hip: Secondary | ICD-10-CM

## 2023-07-09 DIAGNOSIS — M25552 Pain in left hip: Secondary | ICD-10-CM | POA: Diagnosis not present

## 2023-07-09 DIAGNOSIS — M1710 Unilateral primary osteoarthritis, unspecified knee: Secondary | ICD-10-CM | POA: Diagnosis not present

## 2023-07-09 DIAGNOSIS — E1142 Type 2 diabetes mellitus with diabetic polyneuropathy: Secondary | ICD-10-CM

## 2023-07-09 DIAGNOSIS — Z7984 Long term (current) use of oral hypoglycemic drugs: Secondary | ICD-10-CM

## 2023-07-09 DIAGNOSIS — I10 Essential (primary) hypertension: Secondary | ICD-10-CM | POA: Diagnosis not present

## 2023-07-09 NOTE — Progress Notes (Signed)
 Subjective:  Patient ID: Jared Norman, male    DOB: Jun 18, 1940  Age: 83 y.o. MRN: [id number]  CC: Medical Management of Chronic Issues (Hip pain continues. Does not feel comfortable talking the controlled substance for pain. Pain seems to be somewhat better./Would like to discuss medications. Mentioned we discussed started a different medication last time but didin't. )   HPI Jared Norman presents for recheck of the use of Lyrica. He decided against using it. As a result his hip and knee pain continues, but he feels it has improved to the point he can manage it.      07/09/2023   10:11 AM 05/05/2023    9:37 AM 04/17/2023   11:07 AM  Depression screen PHQ 2/9  Decreased Interest 0 0 0  Down, Depressed, Hopeless 0 0 0  PHQ - 2 Score 0 0 0  Altered sleeping 0 0 0  Tired, decreased energy 1 0 0  Change in appetite 0 0 0  Feeling bad or failure about yourself  0 0 0  Trouble concentrating 0 0 0  Moving slowly or fidgety/restless 0 0 0  Suicidal thoughts 0 0 0  PHQ-9 Score 1 0 0  Difficult doing work/chores  Not difficult at all Not difficult at all    History Jared Norman has a past medical history of Arthritis, BPH (benign prostatic hyperplasia), Cataract, Gallstones, Hyperlipidemia, Hypertension, Kidney stones, Type 2 diabetes mellitus (HCC), and Vitamin D deficiency.   He has a past surgical history that includes Cystoscopy with retrograde pyelogram, ureteroscopy and stent placement (Left, 12/20/2013); Foot surgery (Right, 2008); Laparoscopic cholecystectomy (05/2018); and Cataract extraction, bilateral.   His family history includes ALS in his brother; Bladder Cancer in his father; Blindness in his father and paternal grandfather; Diabetes in his brother; Hypertension in his brother.He reports that he has never smoked. He has never used smokeless tobacco. He reports that he does not drink alcohol and does not use drugs.    ROS Review of Systems  Objective:  BP 131/82   Pulse 77    Temp (!) 97.2 F (36.2 C)   Ht 5' 2.5" (1.588 m)   Wt 176 lb (79.8 kg)   SpO2 96%   BMI 31.68 kg/m   BP Readings from Last 3 Encounters:  07/09/23 131/82  04/17/23 (!) 156/83  01/16/23 137/81    Wt Readings from Last 3 Encounters:  07/09/23 176 lb (79.8 kg)  05/05/23 172 lb (78 kg)  04/17/23 172 lb 8 oz (78.2 kg)     Physical Exam Vitals reviewed.  Constitutional:      Appearance: He is well-developed.  HENT:     Head: Normocephalic and atraumatic.     Right Ear: External ear normal.     Left Ear: External ear normal.     Mouth/Throat:     Pharynx: No oropharyngeal exudate or posterior oropharyngeal erythema.  Eyes:     Pupils: Pupils are equal, round, and reactive to light.  Cardiovascular:     Rate and Rhythm: Normal rate and regular rhythm.     Heart sounds: No murmur heard. Pulmonary:     Effort: No respiratory distress.     Breath sounds: Normal breath sounds.  Musculoskeletal:     Cervical back: Normal range of motion and neck supple.  Neurological:     Mental Status: He is alert and oriented to person, place, and time.       Assessment & Plan:   Jared Norman was seen today  for medical management of chronic issues.  Diagnoses and all orders for this visit:  Bilateral hip pain  Arthritis of knee  Primary hypertension  Diabetic polyneuropathy associated with type 2 diabetes mellitus (HCC)       I have discontinued Drayton L. Rossano's pregabalin. I am also having him maintain his magnesium gluconate, cholecalciferol, OneTouch Delica Lancets 30G, blood glucose meter kit and supplies, aspirin EC, finasteride, metFORMIN, olmesartan, OneTouch Verio Flex System, meloxicam, pantoprazole, NIFEdipine, and atorvastatin.  Allergies as of 07/09/2023   No Known Allergies      Medication List        Accurate as of July 09, 2023 11:59 PM. If you have any questions, ask your nurse or doctor.          STOP taking these medications    pregabalin  75 MG capsule Commonly known as: Lyrica Stopped by: Ketina Mars       TAKE these medications    aspirin EC 81 MG tablet Take 1 tablet (81 mg total) by mouth daily.   atorvastatin 20 MG tablet Commonly known as: LIPITOR TAKE 1 TABLET BY MOUTH ONCE DAILY FOR CHOLESTEROL   blood glucose meter kit and supplies Dispense based on patient and insurance preference. Use up to four times daily as directed. (FOR ICD-10 E10.9, E11.9).   cholecalciferol 1000 units tablet Commonly known as: VITAMIN D Take 2,000 Units by mouth daily.   finasteride 5 MG tablet Commonly known as: PROSCAR Take 1 tablet (5 mg total) by mouth daily.   magnesium gluconate 500 MG tablet Commonly known as: MAGONATE Take 1,000 mg by mouth daily.   meloxicam 15 MG tablet Commonly known as: MOBIC Take 1 tablet by mouth once daily   metFORMIN 1000 MG tablet Commonly known as: GLUCOPHAGE Take 1 tablet (1,000 mg total) by mouth daily with breakfast.   NIFEdipine 30 MG 24 hr tablet Commonly known as: ADALAT CC Take 1 tablet by mouth once daily for blood pressure   olmesartan 40 MG tablet Commonly known as: BENICAR Take 1 tablet (40 mg total) by mouth daily. for blood pressure   OneTouch Delica Lancets 30G Misc USE 1  TO CHECK GLUCOSE 4 TIMES DAILY   OneTouch Verio Flex System w/Device Kit USE TO CHECK GLUCOSE IN THE MORNING, AT NOON, AND AT BEDTIME   pantoprazole 20 MG tablet Commonly known as: PROTONIX Take 1 tablet by mouth once daily         Follow-up: No follow-ups on file.  Mechele Claude, M.D.

## 2023-07-12 DIAGNOSIS — M25551 Pain in right hip: Secondary | ICD-10-CM | POA: Insufficient documentation

## 2023-07-22 ENCOUNTER — Ambulatory Visit: Payer: PPO | Attending: Internal Medicine | Admitting: Internal Medicine

## 2023-07-22 ENCOUNTER — Encounter: Payer: Self-pay | Admitting: Internal Medicine

## 2023-07-22 VITALS — BP 124/72 | HR 78 | Ht 63.0 in | Wt 175.0 lb

## 2023-07-22 DIAGNOSIS — M79606 Pain in leg, unspecified: Secondary | ICD-10-CM | POA: Diagnosis not present

## 2023-07-22 DIAGNOSIS — Z136 Encounter for screening for cardiovascular disorders: Secondary | ICD-10-CM

## 2023-07-22 DIAGNOSIS — R931 Abnormal findings on diagnostic imaging of heart and coronary circulation: Secondary | ICD-10-CM | POA: Insufficient documentation

## 2023-07-22 DIAGNOSIS — I10 Essential (primary) hypertension: Secondary | ICD-10-CM | POA: Diagnosis not present

## 2023-07-22 NOTE — Patient Instructions (Addendum)
 Medication Instructions:  Your physician recommends that you continue on your current medications as directed. Please refer to the Current Medication list given to you today.   Labwork: None  Testing/Procedures: Your physician has requested that you have an echocardiogram. Echocardiography is a painless test that uses sound waves to create images of your heart. It provides your doctor with information about the size and shape of your heart and how well your heart's chambers and valves are working. This procedure takes approximately one hour. There are no restrictions for this procedure. Please do NOT wear cologne, perfume, aftershave, or lotions (deodorant is allowed). Please arrive 15 minutes prior to your appointment time.  Please note: We ask at that you not bring children with you during ultrasound (echo/ vascular) testing. Due to room size and safety concerns, children are not allowed in the ultrasound rooms during exams. Our front office staff cannot provide observation of children in our lobby area while testing is being conducted. An adult accompanying a patient to their appointment will only be allowed in the ultrasound room at the discretion of the ultrasound technician under special circumstances. We apologize for any inconvenience.  Your physician has requested that you have an ankle brachial index (ABI). During this test an ultrasound and blood pressure cuff are used to evaluate the arteries that supply the arms and legs with blood. Allow thirty minutes for this exam. There are no restrictions or special instructions.  Please note: We ask at that you not bring children with you during ultrasound (echo/ vascular) testing. Due to room size and safety concerns, children are not allowed in the ultrasound rooms during exams. Our front office staff cannot provide observation of children in our lobby area while testing is being conducted. An adult accompanying a patient to their appointment will  only be allowed in the ultrasound room at the discretion of the ultrasound technician under special circumstances. We apologize for any inconvenience.   Follow-Up: Your physician recommends that you schedule a follow-up appointment in: Pending Results   Any Other Special Instructions Will Be Listed Below (If Applicable). Thank you for choosing [facility]!     If you need a refill on your cardiac medications before your next appointment, please call your pharmacy.

## 2023-07-22 NOTE — Progress Notes (Signed)
 Cardiology Office Note  Date: 07/22/2023   ID: RASHAN ROUNSAVILLE, DOB 11/30/1940, MRN [id number]  PCP:  Mechele Claude, MD  Cardiologist:  Marjo Bicker, MD Electrophysiologist:  None   Reason for Office Visit: Screening for CAD evaluation at the request of Dr. Darlyn Read   History of Present Illness: CASSIEL FERNANDEZ is a 83 y.o. male known to have HTN, DM 2, HLD is here for follow-up visit.  CT calcium scoring test revealed elevated coronary calcium score of 1267 and small to moderate sized hiatal hernia.  Echocardiogram in 2020 showed normal LVEF, no valve issues.  NM stress test in 2020 showed no evidence of ischemia.  He bikes daily for 1 hour on a stationary bike.  No symptoms of angina, DOE, dizziness, presyncope, syncope, palpitations or leg swelling.  He reported having leg soreness after he bikes.  Whenever he lifts any heavy weights, he notices that he gets back pain and he was told by his PCP that it could be from arthritis after he underwent x-ray spine.  Past Medical History:  Diagnosis Date   Arthritis    BPH (benign prostatic hyperplasia)    Cataract    Gallstones    Hyperlipidemia    Hypertension    Kidney stones    Type 2 diabetes mellitus (HCC)    Vitamin D deficiency     Past Surgical History:  Procedure Laterality Date   CATARACT EXTRACTION, BILATERAL     CYSTOSCOPY WITH RETROGRADE PYELOGRAM, URETEROSCOPY AND STENT PLACEMENT Left 12/20/2013   Procedure: CYSTOSCOPY WITHLEFT RETROGRADE PYELOGRAM,  AND LEFT STENT PLACEMENT;  Surgeon: Anner Crete, MD;  Location: [facility];  Service: Urology;  Laterality: Left;   FOOT SURGERY Right 2008   Tendon rupture   LAPAROSCOPIC CHOLECYSTECTOMY  05/2018   Dr. Marcha Solders    Current Outpatient Medications  Medication Sig Dispense Refill   aspirin EC 81 MG tablet Take 1 tablet (81 mg total) by mouth daily.     atorvastatin (LIPITOR) 20 MG tablet TAKE 1 TABLET BY MOUTH ONCE DAILY FOR CHOLESTEROL 90 tablet 0   blood glucose  meter kit and supplies Dispense based on patient and insurance preference. Use up to four times daily as directed. (FOR ICD-10 E10.9, E11.9). 1 each 0   Blood Glucose Monitoring Suppl (ONETOUCH VERIO FLEX SYSTEM) w/Device KIT USE TO CHECK GLUCOSE IN THE MORNING, AT NOON, AND AT BEDTIME 1 kit 0   cholecalciferol (VITAMIN D) 1000 UNITS tablet Take 2,000 Units by mouth daily.     finasteride (PROSCAR) 5 MG tablet Take 1 tablet (5 mg total) by mouth daily. 90 tablet 3   magnesium gluconate (MAGONATE) 500 MG tablet Take 1,000 mg by mouth daily.      meloxicam (MOBIC) 15 MG tablet Take 1 tablet by mouth once daily 90 tablet 0   metFORMIN (GLUCOPHAGE) 1000 MG tablet Take 1 tablet (1,000 mg total) by mouth daily with breakfast. 90 tablet 3   NIFEdipine (ADALAT CC) 30 MG 24 hr tablet Take 1 tablet by mouth once daily for blood pressure 90 tablet 0   olmesartan (BENICAR) 40 MG tablet Take 1 tablet (40 mg total) by mouth daily. for blood pressure 90 tablet 3   OneTouch Delica Lancets 30G MISC USE 1  TO CHECK GLUCOSE 4 TIMES DAILY 100 each 11   pantoprazole (PROTONIX) 20 MG tablet Take 1 tablet by mouth once daily 90 tablet 1   No current facility-administered medications for this visit.   Allergies:  Patient has no known allergies.   Social History: The patient  reports that he has never smoked. He has never used smokeless tobacco. He reports that he does not drink alcohol and does not use drugs.   Family History: The patient's family history includes ALS in his brother; Bladder Cancer in his father; Blindness in his father and paternal grandfather; Diabetes in his brother; Hypertension in his brother.   ROS:  Please see the history of present illness. Otherwise, complete review of systems is positive for none.  All other systems are reviewed and negative.   Physical Exam: VS:  There were no vitals taken for this visit., BMI There is no height or weight on file to calculate BMI.  Wt Readings from Last  3 Encounters:  07/09/23 176 lb (79.8 kg)  05/05/23 172 lb (78 kg)  04/17/23 172 lb 8 oz (78.2 kg)    General: Patient appears comfortable at rest. HEENT: Conjunctiva and lids normal, oropharynx clear with moist mucosa. Neck: Supple, no elevated JVP or carotid bruits, no thyromegaly. Lungs: Clear to auscultation, nonlabored breathing at rest. Cardiac: Regular rate and rhythm, no S3 or significant systolic murmur, no pericardial rub. Abdomen: Soft, nontender, no hepatomegaly, bowel sounds present, no guarding or rebound. Extremities: No pitting edema, distal pulses 2+. Skin: Warm and dry. Musculoskeletal: No kyphosis. Neuropsychiatric: Alert and oriented x3, affect grossly appropriate.  ECG:  An ECG dated 06/17/2022 was personally reviewed today and demonstrated:  Normal sinus rhythm, first-degree AV block and PVCs  Recent Labwork: 04/17/2023: ALT 43; AST 31; BUN 26; Creatinine, Ser 1.28; Hemoglobin 15.1; Platelets 228; Potassium 5.2; Sodium 141     Component Value Date/Time   CHOL 150 04/17/2023 1045   TRIG 73 04/17/2023 1045   TRIG 119 06/01/2013 1101   HDL 65 04/17/2023 1045   HDL 45 06/01/2013 1101   CHOLHDL 2.3 04/17/2023 1045   CHOLHDL 3.5 04/19/2014 1042   VLDL 15 04/19/2014 1042   LDLCALC 71 04/17/2023 1045   LDLCALC 68 06/01/2013 1101     Assessment and Plan:  Elevated coronary calcium score 1267: Asymptomatic.  Continue aspirin 81 mg once daily and atorvastatin 10 mg nightly.  If he develops any new symptoms of angina or DOE, will need to plan for noninvasive ischemia evaluation with stress test.  I discussed with him and his wife about the symptoms of CAD and MI.  ER precautions for chest pain provided.  Will update echocardiogram.  Bilateral leg soreness: He does have bilateral leg soreness after he bikes for 1 hour.  Likely secondary to biking but will rule out PAD.  Will obtain ABI.  HTN, controlled: Continue current antihypertensives, nifedipine 30 mg once daily and  olmesartan 40 mg once daily.  Follows with PCP.  HLD, at goal: Continue atorvastatin 10 mg nightly.   Medication Adjustments/Labs and Tests Ordered: Current medicines are reviewed at length with the patient today.  Concerns regarding medicines are outlined above.   Tests Ordered: Orders Placed This Encounter  Procedures   EKG 12-Lead    Medication Changes: No orders of the defined types were placed in this encounter.   Disposition:  Follow up pending results  Signed Willis Kuipers Verne Spurr, MD, 07/22/2023 11:37 AM    [facility] at [facility] [address] [place], [place], [place] [address]

## 2023-07-23 ENCOUNTER — Encounter (INDEPENDENT_AMBULATORY_CARE_PROVIDER_SITE_OTHER): Payer: Medicare HMO | Admitting: Ophthalmology

## 2023-07-23 DIAGNOSIS — Z7984 Long term (current) use of oral hypoglycemic drugs: Secondary | ICD-10-CM

## 2023-07-23 DIAGNOSIS — E113513 Type 2 diabetes mellitus with proliferative diabetic retinopathy with macular edema, bilateral: Secondary | ICD-10-CM | POA: Diagnosis not present

## 2023-07-23 DIAGNOSIS — H43813 Vitreous degeneration, bilateral: Secondary | ICD-10-CM

## 2023-07-23 DIAGNOSIS — H35033 Hypertensive retinopathy, bilateral: Secondary | ICD-10-CM | POA: Diagnosis not present

## 2023-07-23 DIAGNOSIS — I10 Essential (primary) hypertension: Secondary | ICD-10-CM | POA: Diagnosis not present

## 2023-08-07 ENCOUNTER — Other Ambulatory Visit: Payer: Self-pay | Admitting: Family Medicine

## 2023-08-07 DIAGNOSIS — M7731 Calcaneal spur, right foot: Secondary | ICD-10-CM

## 2023-08-13 ENCOUNTER — Ambulatory Visit (INDEPENDENT_AMBULATORY_CARE_PROVIDER_SITE_OTHER)

## 2023-08-13 ENCOUNTER — Ambulatory Visit: Attending: Internal Medicine

## 2023-08-13 DIAGNOSIS — M79606 Pain in leg, unspecified: Secondary | ICD-10-CM

## 2023-08-13 DIAGNOSIS — M79604 Pain in right leg: Secondary | ICD-10-CM

## 2023-08-13 DIAGNOSIS — M79605 Pain in left leg: Secondary | ICD-10-CM | POA: Diagnosis not present

## 2023-08-13 DIAGNOSIS — R931 Abnormal findings on diagnostic imaging of heart and coronary circulation: Secondary | ICD-10-CM | POA: Diagnosis not present

## 2023-08-13 DIAGNOSIS — I517 Cardiomegaly: Secondary | ICD-10-CM | POA: Diagnosis not present

## 2023-08-13 LAB — ECHOCARDIOGRAM COMPLETE
AR max vel: 4.12 cm2
AV Area VTI: 4.41 cm2
AV Area mean vel: 3.83 cm2
AV Mean grad: 3 mmHg
AV Peak grad: 4.8 mmHg
Ao pk vel: 1.09 m/s
Area-P 1/2: 3.08 cm2
Calc EF: 58.4 %
MV VTI: 3.27 cm2
S' Lateral: 2.9 cm
Single Plane A2C EF: 57.1 %
Single Plane A4C EF: 57.8 %

## 2023-08-13 LAB — VAS US ABI WITH/WO TBI
Left ABI: 1.66
Right ABI: 1.66

## 2023-08-22 ENCOUNTER — Encounter (INDEPENDENT_AMBULATORY_CARE_PROVIDER_SITE_OTHER): Admitting: Ophthalmology

## 2023-08-22 DIAGNOSIS — E113513 Type 2 diabetes mellitus with proliferative diabetic retinopathy with macular edema, bilateral: Secondary | ICD-10-CM | POA: Diagnosis not present

## 2023-08-22 DIAGNOSIS — I10 Essential (primary) hypertension: Secondary | ICD-10-CM | POA: Diagnosis not present

## 2023-08-22 DIAGNOSIS — Z7984 Long term (current) use of oral hypoglycemic drugs: Secondary | ICD-10-CM | POA: Diagnosis not present

## 2023-08-22 DIAGNOSIS — H43813 Vitreous degeneration, bilateral: Secondary | ICD-10-CM

## 2023-08-22 DIAGNOSIS — H35033 Hypertensive retinopathy, bilateral: Secondary | ICD-10-CM | POA: Diagnosis not present

## 2023-08-28 ENCOUNTER — Other Ambulatory Visit: Payer: Self-pay | Admitting: Family Medicine

## 2023-08-28 ENCOUNTER — Encounter: Payer: Self-pay | Admitting: Family Medicine

## 2023-08-28 ENCOUNTER — Ambulatory Visit (INDEPENDENT_AMBULATORY_CARE_PROVIDER_SITE_OTHER): Payer: Medicare HMO | Admitting: Family Medicine

## 2023-08-28 VITALS — BP 135/84 | HR 77 | Temp 97.9°F | Ht 63.0 in | Wt 170.0 lb

## 2023-08-28 DIAGNOSIS — I10 Essential (primary) hypertension: Secondary | ICD-10-CM | POA: Diagnosis not present

## 2023-08-28 DIAGNOSIS — E782 Mixed hyperlipidemia: Secondary | ICD-10-CM

## 2023-08-28 DIAGNOSIS — I7090 Unspecified atherosclerosis: Secondary | ICD-10-CM | POA: Diagnosis not present

## 2023-08-28 DIAGNOSIS — E1169 Type 2 diabetes mellitus with other specified complication: Secondary | ICD-10-CM

## 2023-08-28 DIAGNOSIS — E119 Type 2 diabetes mellitus without complications: Secondary | ICD-10-CM

## 2023-08-28 DIAGNOSIS — E756 Lipid storage disorder, unspecified: Secondary | ICD-10-CM

## 2023-08-28 DIAGNOSIS — N401 Enlarged prostate with lower urinary tract symptoms: Secondary | ICD-10-CM

## 2023-08-28 DIAGNOSIS — Z23 Encounter for immunization: Secondary | ICD-10-CM

## 2023-08-28 DIAGNOSIS — K219 Gastro-esophageal reflux disease without esophagitis: Secondary | ICD-10-CM

## 2023-08-28 LAB — BAYER DCA HB A1C WAIVED: HB A1C (BAYER DCA - WAIVED): 7 % — ABNORMAL HIGH (ref 4.8–5.6)

## 2023-08-28 MED ORDER — METFORMIN HCL 1000 MG PO TABS: 1000.0000 mg | ORAL_TABLET | Freq: Every day | ORAL | 3 refills | Status: AC

## 2023-08-28 MED ORDER — ATORVASTATIN CALCIUM 20 MG PO TABS: 20.0000 mg | ORAL_TABLET | Freq: Every day | ORAL | 0 refills | Status: DC

## 2023-08-28 MED ORDER — OLMESARTAN MEDOXOMIL 40 MG PO TABS
40.0000 mg | ORAL_TABLET | Freq: Every day | ORAL | 3 refills | Status: AC
Start: 2023-08-28 — End: ?

## 2023-08-28 MED ORDER — PANTOPRAZOLE SODIUM 20 MG PO TBEC: 20.0000 mg | DELAYED_RELEASE_TABLET | Freq: Every day | ORAL | 1 refills | Status: DC

## 2023-08-28 MED ORDER — FINASTERIDE 5 MG PO TABS: 5.0000 mg | ORAL_TABLET | Freq: Every day | ORAL | 3 refills | Status: AC

## 2023-08-28 MED ORDER — NIFEDIPINE ER 30 MG PO TB24: 30.0000 mg | ORAL_TABLET | Freq: Every day | ORAL | 0 refills | Status: DC

## 2023-08-28 NOTE — Progress Notes (Unsigned)
 Subjective:  Patient ID: Jared Norman,  male    DOB: [birth year]  Age: 83 y.o.    CC: Medical Management of Chronic Issues (Pt is currently on abx for toe infection/ being seen podiatry/Pneumonia completed but told by pharmacy its due after 5 years?)   HPI Jared Norman presents for  follow-up of hypertension. Patient has no history of headache chest pain or shortness of breath or recent cough. Patient also denies symptoms of TIA such as numbness weakness lateralizing. Patient denies side effects from medication. States taking it regularly.  Patient also  in for follow-up of elevated cholesterol. Doing well without complaints on current medication. Denies side effects  including myalgia and arthralgia and nausea. Also in today for liver function testing. Currently no chest pain, shortness of breath or other cardiovascular related symptoms noted.  Follow-up of diabetes. Patient does check blood sugar at home.Log reviewed, sent to be scanned. Most fasting running 100-150 Patient denies symptoms such as excessive hunger or urinary frequency, excessive hunger, nausea No significant hypoglycemic spells noted. Medications reviewed. Pt reports taking them regularly. Pt. denies complication/adverse reaction today.    History Jared Norman has a past medical history of Arthritis, BPH (benign prostatic hyperplasia), Cataract, Gallstones, Hyperlipidemia, Hypertension, Kidney stones, Type 2 diabetes mellitus (HCC), and Vitamin D  deficiency.   He has a past surgical history that includes Cystoscopy with retrograde pyelogram, ureteroscopy and stent placement (Left, 12/20/2013); Foot surgery (Right, 2008); Laparoscopic cholecystectomy (05/2018); and Cataract extraction, bilateral.   His family history includes ALS in his brother; Bladder Cancer in his father; Blindness in his father and paternal grandfather; Diabetes in his brother; Hypertension in his brother.He reports that he has never smoked. He has never  used smokeless tobacco. He reports that he does not drink alcohol and does not use drugs.  Current Outpatient Medications on File Prior to Visit  Medication Sig Dispense Refill   aspirin  EC 81 MG tablet Take 1 tablet (81 mg total) by mouth daily.     blood glucose meter kit and supplies Dispense based on patient and insurance preference. Use up to four times daily as directed. (FOR ICD-10 E10.9, E11.9). 1 each 0   Blood Glucose Monitoring Suppl (ONETOUCH VERIO FLEX SYSTEM) w/Device KIT USE TO CHECK GLUCOSE IN THE MORNING, AT NOON, AND AT BEDTIME 1 kit 0   cephALEXin (KEFLEX) 500 MG capsule Take 500 mg by mouth 3 (three) times daily.     cholecalciferol (VITAMIN D ) 1000 UNITS tablet Take 2,000 Units by mouth daily.     magnesium  gluconate (MAGONATE) 500 MG tablet Take 1,000 mg by mouth daily.      meloxicam  (MOBIC ) 15 MG tablet Take 1 tablet by mouth once daily 90 tablet 0   OneTouch Delica Lancets 30G MISC USE 1  TO CHECK GLUCOSE 4 TIMES DAILY 100 each 11   No current facility-administered medications on file prior to visit.    ROS Review of Systems  Constitutional: Negative.   HENT: Negative.    Eyes:  Negative for visual disturbance.  Respiratory:  Negative for cough and shortness of breath.   Cardiovascular:  Negative for chest pain and leg swelling.  Gastrointestinal:  Negative for abdominal pain, diarrhea, nausea and vomiting.  Genitourinary:  Negative for difficulty urinating.  Musculoskeletal:  Negative for arthralgias and myalgias.  Skin:  Negative for rash.  Neurological:  Negative for headaches.  Psychiatric/Behavioral:  Negative for sleep disturbance.     Objective:  BP 135/84   Pulse 77  Temp 97.9 F (36.6 C)   Ht 5\' 3"  (1.6 m)   Wt 170 lb (77.1 kg)   SpO2 98%   BMI 30.11 kg/m   BP Readings from Last 3 Encounters:  08/28/23 135/84  07/22/23 124/72  07/09/23 131/82    Wt Readings from Last 3 Encounters:  08/28/23 170 lb (77.1 kg)  07/22/23 175 lb (79.4  kg)  07/09/23 176 lb (79.8 kg)    Lab Results  Component Value Date   HGBA1C 7.0 (H) 08/28/2023   HGBA1C 6.8 (H) 04/17/2023   HGBA1C 6.4 (H) 01/16/2023    Physical Exam Vitals reviewed.  Constitutional:      Appearance: He is well-developed.  HENT:     Head: Normocephalic and atraumatic.     Right Ear: External ear normal.     Left Ear: External ear normal.     Mouth/Throat:     Pharynx: No oropharyngeal exudate or posterior oropharyngeal erythema.  Eyes:     Pupils: Pupils are equal, round, and reactive to light.  Cardiovascular:     Rate and Rhythm: Normal rate and regular rhythm.     Heart sounds: No murmur heard. Pulmonary:     Effort: No respiratory distress.     Breath sounds: Normal breath sounds.  Musculoskeletal:     Cervical back: Normal range of motion and neck supple.  Neurological:     Mental Status: He is alert and oriented to person, place, and time.     {Perform Simple Foot Exam  Perform Detailed exam:1} {Insert foot Exam (Optional):30965}   Assessment & Plan:  Primary hypertension -     CBC with Differential/Platelet  Mixed hyperlipidemia -     CMP14+EGFR  Diabetic lipidosis (HCC) -     Bayer DCA Hb A1c Waived -     CBC with Differential/Platelet -     CMP14+EGFR  Atherosclerosis -     Bayer DCA Hb A1c Waived -     CBC with Differential/Platelet -     CMP14+EGFR  Diabetes mellitus without complication (HCC) -     metFORMIN  HCl; Take 1 tablet (1,000 mg total) by mouth daily with breakfast.  Dispense: 90 tablet; Refill: 3  Benign prostatic hyperplasia with lower urinary tract symptoms, symptom details unspecified -     Finasteride ; Take 1 tablet (5 mg total) by mouth daily.  Dispense: 90 tablet; Refill: 3  Gastroesophageal reflux disease without esophagitis -     Pantoprazole  Sodium; Take 1 tablet (20 mg total) by mouth daily.  Dispense: 90 tablet; Refill: 1  Immunization due -     Tdap vaccine greater than or equal to 7yo IM -      Pneumococcal conjugate vaccine 20-valent  Other orders -     Atorvastatin  Calcium ; Take 1 tablet (20 mg total) by mouth daily. for cholesterol.  Dispense: 90 tablet; Refill: 0 -     NIFEdipine  ER; Take 1 tablet (30 mg total) by mouth daily. for blood pressure  Dispense: 90 tablet; Refill: 0 -     Olmesartan  Medoxomil; Take 1 tablet (40 mg total) by mouth daily. for blood pressure  Dispense: 90 tablet; Refill: 3    Follow-up: No follow-ups on file.  Roise Cleaver, M.D.

## 2023-08-29 ENCOUNTER — Telehealth: Payer: Self-pay

## 2023-08-29 DIAGNOSIS — M79606 Pain in leg, unspecified: Secondary | ICD-10-CM

## 2023-08-29 LAB — CMP14+EGFR
ALT: 38 IU/L (ref 0–44)
AST: 36 IU/L (ref 0–40)
Albumin: 4.7 g/dL (ref 3.7–4.7)
Alkaline Phosphatase: 99 IU/L (ref 44–121)
BUN/Creatinine Ratio: 18 (ref 10–24)
BUN: 21 mg/dL (ref 8–27)
Bilirubin Total: 1.1 mg/dL (ref 0.0–1.2)
CO2: 22 mmol/L (ref 20–29)
Calcium: 9.9 mg/dL (ref 8.6–10.2)
Chloride: 99 mmol/L (ref 96–106)
Creatinine, Ser: 1.14 mg/dL (ref 0.76–1.27)
Globulin, Total: 2.6 g/dL (ref 1.5–4.5)
Glucose: 128 mg/dL — ABNORMAL HIGH (ref 70–99)
Potassium: 4.9 mmol/L (ref 3.5–5.2)
Sodium: 139 mmol/L (ref 134–144)
Total Protein: 7.3 g/dL (ref 6.0–8.5)
eGFR: 64 mL/min/{1.73_m2} (ref >59)

## 2023-08-29 LAB — CBC WITH DIFFERENTIAL/PLATELET
Basophils Absolute: 0.1 10*3/uL (ref 0.0–0.2)
Basos: 2 %
EOS (ABSOLUTE): 0.4 10*3/uL (ref 0.0–0.4)
Eos: 5 %
Hematocrit: 46.8 % (ref 37.5–51.0)
Hemoglobin: 15.8 g/dL (ref 13.0–17.7)
Immature Grans (Abs): 0 10*3/uL (ref 0.0–0.1)
Immature Granulocytes: 0 %
Lymphocytes Absolute: 2.2 10*3/uL (ref 0.7–3.1)
Lymphs: 29 %
MCH: 32.2 pg (ref 26.6–33.0)
MCHC: 33.8 g/dL (ref 31.5–35.7)
MCV: 95 fL (ref 79–97)
Monocytes Absolute: 0.7 10*3/uL (ref 0.1–0.9)
Monocytes: 9 %
Neutrophils Absolute: 4.4 10*3/uL (ref 1.4–7.0)
Neutrophils: 55 %
Platelets: 229 10*3/uL (ref 150–450)
RBC: 4.91 x10E6/uL (ref 4.14–5.80)
RDW: 13 % (ref 11.6–15.4)
WBC: 7.8 10*3/uL (ref 3.4–10.8)

## 2023-08-29 NOTE — Telephone Encounter (Signed)
 The patient has been notified of the result and verbalized understanding.  All questions (if any) were answered. Camilo Cella, [place] 08/29/2023 3:32 PM

## 2023-08-29 NOTE — Telephone Encounter (Signed)
-----   Message from Vishnu P Mallipeddi sent at 08/21/2023  1:36 PM EDT ----- Abnormal toe brachial index and noncompressible lower extremity arteries.  Obtain ultrasound arterial Doppler lower extremities.

## 2023-08-29 NOTE — Telephone Encounter (Signed)
 The patient has been notified of the result and verbalized understanding.  All questions (if any) were answered. Patient will be contacted by scheduling to make appointment and patient is aware Camilo Cella, [facility] 08/29/2023 3:27 PM

## 2023-08-29 NOTE — Telephone Encounter (Signed)
-----   Message from Vishnu P Mallipeddi sent at 08/22/2023  1:24 PM EDT ----- Normal LVEF, G1 DD with normal LVEDP, normal RV function, no valvular heart disease.  Overall, normal echocardiogram.

## 2023-08-31 ENCOUNTER — Encounter: Payer: Self-pay | Admitting: Family Medicine

## 2023-08-31 NOTE — Progress Notes (Signed)
Hello Amandeep,  Your lab result is normal and/or stable.Some minor variations that are not significant are commonly marked abnormal, but do not represent any medical problem for you.  Best regards, Warren Stacks, M.D.

## 2023-09-01 DIAGNOSIS — Z23 Encounter for immunization: Secondary | ICD-10-CM

## 2023-09-22 ENCOUNTER — Ambulatory Visit: Attending: Internal Medicine

## 2023-09-22 DIAGNOSIS — I739 Peripheral vascular disease, unspecified: Secondary | ICD-10-CM

## 2023-09-22 DIAGNOSIS — M79606 Pain in leg, unspecified: Secondary | ICD-10-CM

## 2023-09-24 ENCOUNTER — Encounter (INDEPENDENT_AMBULATORY_CARE_PROVIDER_SITE_OTHER): Admitting: Ophthalmology

## 2023-09-24 ENCOUNTER — Ambulatory Visit: Payer: Self-pay | Admitting: Internal Medicine

## 2023-09-25 ENCOUNTER — Encounter (INDEPENDENT_AMBULATORY_CARE_PROVIDER_SITE_OTHER): Admitting: Ophthalmology

## 2023-09-25 MED ORDER — CILOSTAZOL 50 MG PO TABS: 50.0000 mg | ORAL_TABLET | Freq: Two times a day (BID) | ORAL | 3 refills | Status: AC

## 2023-09-25 NOTE — Telephone Encounter (Signed)
 The patient has been notified of the result and verbalized understanding.  All questions (if any) were answered. Camilo Cella, [place] 09/25/2023 11:24 AM

## 2023-09-25 NOTE — Telephone Encounter (Signed)
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  Start Pletal 50 mg twice daily.  Side effects include palpitations.  Schedule follow-up in 6 weeks to discuss.

## 2023-09-26 ENCOUNTER — Encounter (INDEPENDENT_AMBULATORY_CARE_PROVIDER_SITE_OTHER): Admitting: Ophthalmology

## 2023-09-26 DIAGNOSIS — I10 Essential (primary) hypertension: Secondary | ICD-10-CM

## 2023-09-26 DIAGNOSIS — Z7984 Long term (current) use of oral hypoglycemic drugs: Secondary | ICD-10-CM | POA: Diagnosis not present

## 2023-09-26 DIAGNOSIS — H43813 Vitreous degeneration, bilateral: Secondary | ICD-10-CM

## 2023-09-26 DIAGNOSIS — H35033 Hypertensive retinopathy, bilateral: Secondary | ICD-10-CM | POA: Diagnosis not present

## 2023-09-26 DIAGNOSIS — E113513 Type 2 diabetes mellitus with proliferative diabetic retinopathy with macular edema, bilateral: Secondary | ICD-10-CM | POA: Diagnosis not present

## 2023-10-14 ENCOUNTER — Telehealth: Payer: Self-pay | Admitting: Internal Medicine

## 2023-10-14 DIAGNOSIS — R609 Edema, unspecified: Secondary | ICD-10-CM

## 2023-10-14 DIAGNOSIS — Z136 Encounter for screening for cardiovascular disorders: Secondary | ICD-10-CM

## 2023-10-14 NOTE — Telephone Encounter (Signed)
 Pt c/o medication issue:  1. Name of Medication:   cilostazol  (PLETAL ) 50 MG tablet    2. How are you currently taking this medication (dosage and times per day)? Stopped taking  3. Are you having a reaction (difficulty breathing--STAT)? Makes his legs/feet swell   4. What is your medication issue? Wife said medication was not work and pt has stopped taking medication

## 2023-10-16 NOTE — Telephone Encounter (Signed)
 Spoke with patient's wife Jared Norman. Stated that he has been on Nifedipine  for years. No swelling occurred while taking that only when he started taking the Pletal . Advised her that will need to have fluid levels checked. Can go to [facility] to have this checked. Doesn't know if he has gained weight as of now will have him to check. She is still currently trying to upload pictures of patients feet and legs.

## 2023-10-20 ENCOUNTER — Telehealth: Payer: Self-pay | Admitting: Internal Medicine

## 2023-10-20 NOTE — Telephone Encounter (Signed)
  Jared Norman calling back, she said, they did not receive the fax yet, she ask if it can be refax to this number [phone]

## 2023-10-20 NOTE — Telephone Encounter (Signed)
Sent to provided fax number

## 2023-10-20 NOTE — Telephone Encounter (Signed)
 Alita with [facility] says patient is there now, but they do not have access to order in system. She says it must be faxed to them at [phone].

## 2023-10-20 NOTE — Telephone Encounter (Signed)
 Lab faxed

## 2023-10-29 NOTE — Telephone Encounter (Signed)
 Per Dr. Corinne Dicker:   Pro-BNP normal. Use compression socks for leg swelling. Check daily weights. If weights go up, then he is retaining fluid.   Spoke with wife Debria Fang. She verbalized understanding.

## 2023-10-30 ENCOUNTER — Encounter (INDEPENDENT_AMBULATORY_CARE_PROVIDER_SITE_OTHER): Admitting: Ophthalmology

## 2023-11-03 ENCOUNTER — Encounter (INDEPENDENT_AMBULATORY_CARE_PROVIDER_SITE_OTHER): Admitting: Ophthalmology

## 2023-11-03 DIAGNOSIS — Z7984 Long term (current) use of oral hypoglycemic drugs: Secondary | ICD-10-CM | POA: Diagnosis not present

## 2023-11-03 DIAGNOSIS — I10 Essential (primary) hypertension: Secondary | ICD-10-CM

## 2023-11-03 DIAGNOSIS — E113513 Type 2 diabetes mellitus with proliferative diabetic retinopathy with macular edema, bilateral: Secondary | ICD-10-CM | POA: Diagnosis not present

## 2023-11-03 DIAGNOSIS — H35033 Hypertensive retinopathy, bilateral: Secondary | ICD-10-CM

## 2023-11-03 DIAGNOSIS — H43813 Vitreous degeneration, bilateral: Secondary | ICD-10-CM

## 2023-11-06 ENCOUNTER — Ambulatory Visit: Admitting: Internal Medicine

## 2023-11-17 ENCOUNTER — Encounter (INDEPENDENT_AMBULATORY_CARE_PROVIDER_SITE_OTHER): Payer: Self-pay | Admitting: Otolaryngology

## 2023-11-17 ENCOUNTER — Ambulatory Visit (INDEPENDENT_AMBULATORY_CARE_PROVIDER_SITE_OTHER): Payer: PPO | Admitting: Otolaryngology

## 2023-11-17 VITALS — BP 134/74 | HR 80

## 2023-11-17 DIAGNOSIS — H6123 Impacted cerumen, bilateral: Secondary | ICD-10-CM

## 2023-11-17 DIAGNOSIS — H903 Sensorineural hearing loss, bilateral: Secondary | ICD-10-CM | POA: Diagnosis not present

## 2023-11-18 DIAGNOSIS — H6123 Impacted cerumen, bilateral: Secondary | ICD-10-CM | POA: Insufficient documentation

## 2023-11-18 NOTE — Progress Notes (Signed)
 Patient ID: Jared Norman, male   DOB: [birth year], 83 y.o.   MRN: [id number]  Follow-up: Hearing loss  HPI: The patient is an 83 year old male who returns today for his follow-up evaluation.  The patient was previously seen for bilateral sensorineural hearing loss and recurrent cerumen impaction.  He was fitted with bilateral hearing aids.  The patient returns today reporting no significant change in his hearing.  He is doing well with his hearing aids.  He denies any otalgia, otorrhea, or vertigo.  Exam: General: Communicates without difficulty, well nourished, no acute distress. Head: Normocephalic, no evidence injury, no tenderness, facial buttresses intact without stepoff. Face/sinus: No tenderness to palpation and percussion. Facial movement is normal and symmetric. Eyes: PERRL, EOMI. No scleral icterus, conjunctivae clear. Neuro: CN II exam reveals vision grossly intact.  No nystagmus at any point of gaze. Ears: Auricles well formed without lesions.  Bilateral cerumen impaction.  Nose: External evaluation reveals normal support and skin without lesions.  Dorsum is intact.  Anterior rhinoscopy reveals normal mucosa over anterior aspect of inferior turbinates and intact septum.  No purulence noted. Oral:  Oral cavity and oropharynx are intact, symmetric, without erythema or edema.  Mucosa is moist without lesions. Neck: Full range of motion without pain.  There is no significant lymphadenopathy.  No masses palpable.  Thyroid  bed within normal limits to palpation.  Parotid glands and submandibular glands equal bilaterally without mass.  Trachea is midline. Neuro:  CN 2-12 grossly intact.   Procedure: Bilateral cerumen disimpaction Anesthesia: None Description: Under the operating microscope, the cerumen is carefully removed with a combination of cerumen currette, alligator forceps, and suction catheters.  After the cerumen is removed, the TMs are noted to be normal.  No mass, erythema, or lesions. The  patient tolerated the procedure well.    Assessment: 1.  Subjectively stable bilateral sensorineural hearing loss. 2.  Bilateral recurrent cerumen impaction.  After the cerumen removal procedure, both tympanic membranes and middle ear spaces are noted to be normal. 3.  The rest of his ENT exam is normal.  Plan: 1.  The physical exam findings are reviewed with the patient. 2.  Otomicroscopy with bilateral cerumen disimpaction. 3.  Continue the use of his hearing aids. 4.  The patient will return for reevaluation in 1 year.

## 2023-11-20 ENCOUNTER — Other Ambulatory Visit: Payer: Self-pay | Admitting: Family Medicine

## 2023-11-20 DIAGNOSIS — M7731 Calcaneal spur, right foot: Secondary | ICD-10-CM

## 2023-11-27 ENCOUNTER — Other Ambulatory Visit: Payer: Self-pay | Admitting: Family Medicine

## 2023-11-27 ENCOUNTER — Ambulatory Visit (INDEPENDENT_AMBULATORY_CARE_PROVIDER_SITE_OTHER): Admitting: Family Medicine

## 2023-11-27 ENCOUNTER — Encounter: Payer: Self-pay | Admitting: Family Medicine

## 2023-11-27 VITALS — BP 165/68 | HR 66 | Temp 98.2°F | Ht 63.0 in | Wt 173.0 lb

## 2023-11-27 DIAGNOSIS — E782 Mixed hyperlipidemia: Secondary | ICD-10-CM

## 2023-11-27 DIAGNOSIS — I10 Essential (primary) hypertension: Secondary | ICD-10-CM

## 2023-11-27 DIAGNOSIS — E1169 Type 2 diabetes mellitus with other specified complication: Secondary | ICD-10-CM

## 2023-11-27 DIAGNOSIS — E756 Lipid storage disorder, unspecified: Secondary | ICD-10-CM

## 2023-11-27 LAB — BAYER DCA HB A1C WAIVED: HB A1C (BAYER DCA - WAIVED): 6.9 % — ABNORMAL HIGH (ref 4.8–5.6)

## 2023-11-27 MED ORDER — ATORVASTATIN CALCIUM 20 MG PO TABS: 20.0000 mg | ORAL_TABLET | Freq: Every day | ORAL | 0 refills | Status: DC

## 2023-11-27 MED ORDER — NIFEDIPINE ER 30 MG PO TB24: 30.0000 mg | ORAL_TABLET | Freq: Every day | ORAL | 0 refills | Status: AC

## 2023-11-27 NOTE — Progress Notes (Signed)
 Subjective:  Patient ID: Jared Norman, male    DOB: [birth year]  Age: 83 y.o. MRN: [id number]  CC: Medical Management of Chronic Issues (No concerns at this time. )   HPI [name] presents for  presents for  follow-up of hypertension. Patient has no history of headache chest pain or shortness of breath or recent cough. Patient also denies symptoms of TIA such as focal numbness or weakness. Patient denies side effects from medication. States taking it regularly.  presents forFollow-up of diabetes. Patient checks blood sugar at home. Forgot his log sheets.Kinda high the last few days. 136 yesterday. 119 a few days before. Normslly close to 100.  Patient denies symptoms such as polyuria, polydipsia, excessive hunger, nausea No significant hypoglycemic spells noted. Medications reviewed. Pt reports taking them regularly without complication/adverse reaction being reported today.  Lab Results  Component Value Date   HGBA1C 6.9 (H) 11/27/2023   HGBA1C 7.0 (H) 08/28/2023   HGBA1C 6.8 (H) 04/17/2023      in for follow-up of elevated cholesterol. Doing well without complaints on current medication. Denies side effects of statin including myalgia and arthralgia and nausea. Currently no chest pain, shortness of breath or other cardiovascular related symptoms noted.      07/09/2023   10:11 AM 05/05/2023    9:37 AM 04/17/2023   11:07 AM  Depression screen PHQ 2/9  Decreased Interest 0 0 0  Down, Depressed, Hopeless 0 0 0  PHQ - 2 Score 0 0 0  Altered sleeping 0 0 0  Tired, decreased energy 1 0 0  Change in appetite 0 0 0  Feeling bad or failure about yourself  0 0 0  Trouble concentrating 0 0 0  Moving slowly or fidgety/restless 0 0 0  Suicidal thoughts 0 0 0  PHQ-9 Score 1 0 0  Difficult doing work/chores  Not difficult at all Not difficult at all    History [place] has a past medical history of Arthritis, BPH (benign prostatic hyperplasia), Cataract, Gallstones,  Hyperlipidemia, Hypertension, Kidney stones, Type 2 diabetes mellitus (HCC), and Vitamin D  deficiency.   He has a past surgical history that includes Cystoscopy with retrograde pyelogram, ureteroscopy and stent placement (Left, 12/20/2013); Foot surgery (Right, 2008); Laparoscopic cholecystectomy (05/2018); and Cataract extraction, bilateral.   His family history includes ALS in his brother; Bladder Cancer in his father; Blindness in his father and paternal grandfather; Diabetes in his brother; Hypertension in his brother.He reports that he has never smoked. He has never used smokeless tobacco. He reports that he does not drink alcohol and does not use drugs.    ROS Review of Systems  Constitutional:  Negative for fever.  Respiratory:  Negative for shortness of breath.   Cardiovascular:  Negative for chest pain.  Musculoskeletal:  Negative for arthralgias.  Skin:  Negative for rash.    Objective:  BP (!) 165/68   Pulse 66   Temp 98.2 F (36.8 C)   Ht 5' 3 (1.6 m)   Wt 173 lb (78.5 kg)   SpO2 97%   BMI 30.65 kg/m   BP Readings from Last 3 Encounters:  11/27/23 (!) 165/68  11/17/23 134/74  08/28/23 135/84    Wt Readings from Last 3 Encounters:  11/27/23 173 lb (78.5 kg)  08/28/23 170 lb (77.1 kg)  07/22/23 175 lb (79.4 kg)     Physical Exam Vitals reviewed.  Constitutional:      Appearance: He is well-developed.  HENT:  Head: Normocephalic and atraumatic.     Right Ear: External ear normal.     Left Ear: External ear normal.     Mouth/Throat:     Pharynx: No oropharyngeal exudate or posterior oropharyngeal erythema.  Eyes:     Pupils: Pupils are equal, round, and reactive to light.  Cardiovascular:     Rate and Rhythm: Normal rate and regular rhythm.     Heart sounds: No murmur heard. Pulmonary:     Effort: No respiratory distress.     Breath sounds: Normal breath sounds.  Musculoskeletal:     Cervical back: Normal range of motion and neck supple.   Neurological:     Mental Status: He is alert and oriented to person, place, and time.      Assessment & Plan:  Diabetic lipidosis (HCC) -     Bayer DCA Hb A1c Waived -     CMP14+EGFR -     Lipid panel  Mixed hyperlipidemia -     CMP14+EGFR -     Lipid panel  Primary hypertension  Other orders -     Atorvastatin  Calcium ; Take 1 tablet (20 mg total) by mouth daily. for cholesterol.  Dispense: 90 tablet; Refill: 0 -     NIFEdipine  ER; Take 1 tablet (30 mg total) by mouth daily. for blood pressure  Dispense: 90 tablet; Refill: 0     Follow-up: Return in about 3 months (around 02/27/2024) for diabetes, hypertension.  Butler Der, M.D.

## 2023-11-28 LAB — LIPID PANEL
Chol/HDL Ratio: 2.4 ratio (ref 0.0–5.0)
Cholesterol, Total: 134 mg/dL (ref 100–199)
HDL: 57 mg/dL (ref >39)
LDL Chol Calc (NIH): 62 mg/dL (ref 0–99)
Triglycerides: 73 mg/dL (ref 0–149)
VLDL Cholesterol Cal: 15 mg/dL (ref 5–40)

## 2023-11-28 LAB — CMP14+EGFR
ALT: 28 IU/L (ref 0–44)
AST: 27 IU/L (ref 0–40)
Albumin: 4.2 g/dL (ref 3.7–4.7)
Alkaline Phosphatase: 87 IU/L (ref 44–121)
BUN/Creatinine Ratio: 17 (ref 10–24)
BUN: 24 mg/dL (ref 8–27)
Bilirubin Total: 0.8 mg/dL (ref 0.0–1.2)
CO2: 19 mmol/L — ABNORMAL LOW (ref 20–29)
Calcium: 9.4 mg/dL (ref 8.6–10.2)
Chloride: 101 mmol/L (ref 96–106)
Creatinine, Ser: 1.43 mg/dL — ABNORMAL HIGH (ref 0.76–1.27)
Globulin, Total: 2.5 g/dL (ref 1.5–4.5)
Glucose: 129 mg/dL — ABNORMAL HIGH (ref 70–99)
Potassium: 4.8 mmol/L (ref 3.5–5.2)
Sodium: 138 mmol/L (ref 134–144)
Total Protein: 6.7 g/dL (ref 6.0–8.5)
eGFR: 49 mL/min/1.73 — ABNORMAL LOW (ref >59)

## 2023-11-30 ENCOUNTER — Encounter: Payer: Self-pay | Admitting: Family Medicine

## 2023-12-03 ENCOUNTER — Encounter (INDEPENDENT_AMBULATORY_CARE_PROVIDER_SITE_OTHER): Admitting: Ophthalmology

## 2023-12-03 DIAGNOSIS — Z7984 Long term (current) use of oral hypoglycemic drugs: Secondary | ICD-10-CM | POA: Diagnosis not present

## 2023-12-03 DIAGNOSIS — I10 Essential (primary) hypertension: Secondary | ICD-10-CM | POA: Diagnosis not present

## 2023-12-03 DIAGNOSIS — E113513 Type 2 diabetes mellitus with proliferative diabetic retinopathy with macular edema, bilateral: Secondary | ICD-10-CM

## 2023-12-03 DIAGNOSIS — H35033 Hypertensive retinopathy, bilateral: Secondary | ICD-10-CM | POA: Diagnosis not present

## 2023-12-03 DIAGNOSIS — H43813 Vitreous degeneration, bilateral: Secondary | ICD-10-CM

## 2024-01-05 ENCOUNTER — Telehealth: Payer: Self-pay | Admitting: Family Medicine

## 2024-01-05 NOTE — Telephone Encounter (Signed)
 noted

## 2024-01-05 NOTE — Telephone Encounter (Unsigned)
 Copied from CRM #[id number]. Topic: Clinical - Medical Advice >> Jan 05, 2024  9:47 AM Everette C wrote: Reason for CRM: Darryle with [facility] has called on behalf of the patient to request that their most recent urinalysis be reviewed with them at their upcoming appointment on 03/01/24  Please contact Haley at [phone] if needed

## 2024-01-07 ENCOUNTER — Encounter (INDEPENDENT_AMBULATORY_CARE_PROVIDER_SITE_OTHER): Admitting: Ophthalmology

## 2024-01-07 DIAGNOSIS — Z7984 Long term (current) use of oral hypoglycemic drugs: Secondary | ICD-10-CM

## 2024-01-07 DIAGNOSIS — I10 Essential (primary) hypertension: Secondary | ICD-10-CM

## 2024-01-07 DIAGNOSIS — H43813 Vitreous degeneration, bilateral: Secondary | ICD-10-CM

## 2024-01-07 DIAGNOSIS — E113513 Type 2 diabetes mellitus with proliferative diabetic retinopathy with macular edema, bilateral: Secondary | ICD-10-CM

## 2024-01-07 DIAGNOSIS — H35033 Hypertensive retinopathy, bilateral: Secondary | ICD-10-CM | POA: Diagnosis not present

## 2024-02-11 ENCOUNTER — Encounter (INDEPENDENT_AMBULATORY_CARE_PROVIDER_SITE_OTHER): Admitting: Ophthalmology

## 2024-02-11 DIAGNOSIS — I10 Essential (primary) hypertension: Secondary | ICD-10-CM

## 2024-02-11 DIAGNOSIS — E113513 Type 2 diabetes mellitus with proliferative diabetic retinopathy with macular edema, bilateral: Secondary | ICD-10-CM | POA: Diagnosis not present

## 2024-02-11 DIAGNOSIS — Z7984 Long term (current) use of oral hypoglycemic drugs: Secondary | ICD-10-CM | POA: Diagnosis not present

## 2024-02-11 DIAGNOSIS — H43813 Vitreous degeneration, bilateral: Secondary | ICD-10-CM

## 2024-02-11 DIAGNOSIS — H35033 Hypertensive retinopathy, bilateral: Secondary | ICD-10-CM

## 2024-02-14 ENCOUNTER — Other Ambulatory Visit: Payer: Self-pay | Admitting: Family Medicine

## 2024-02-14 DIAGNOSIS — M7731 Calcaneal spur, right foot: Secondary | ICD-10-CM

## 2024-03-01 ENCOUNTER — Encounter: Payer: Self-pay | Admitting: Family Medicine

## 2024-03-01 ENCOUNTER — Ambulatory Visit: Admitting: Family Medicine

## 2024-03-01 ENCOUNTER — Ambulatory Visit: Payer: Self-pay | Admitting: Family Medicine

## 2024-03-01 VITALS — BP 139/77 | HR 70 | Temp 97.8°F | Ht 63.0 in | Wt 173.2 lb

## 2024-03-01 DIAGNOSIS — I10 Essential (primary) hypertension: Secondary | ICD-10-CM

## 2024-03-01 DIAGNOSIS — E119 Type 2 diabetes mellitus without complications: Secondary | ICD-10-CM | POA: Diagnosis not present

## 2024-03-01 DIAGNOSIS — E782 Mixed hyperlipidemia: Secondary | ICD-10-CM

## 2024-03-01 DIAGNOSIS — Z23 Encounter for immunization: Secondary | ICD-10-CM

## 2024-03-01 LAB — BAYER DCA HB A1C WAIVED: HB A1C (BAYER DCA - WAIVED): 6.9 % — ABNORMAL HIGH (ref 4.8–5.6)

## 2024-03-01 MED ORDER — NABUMETONE 500 MG PO TABS: 1000.0000 mg | ORAL_TABLET | Freq: Two times a day (BID) | ORAL | 1 refills | Status: DC

## 2024-03-01 NOTE — Progress Notes (Signed)
 Subjective:  Patient ID: Jared Norman, male    DOB: 04/23/1941  Age: 83 y.o. MRN: [id number]  CC: Medical Management of Chronic Issues   HPI  Discussed the use of AI scribe software for clinical note transcription with the patient, who gave verbal consent to proceed.  History of Present Illness Jared Norman is an 83 year old male with diabetes and hypertension who presents for a follow-up visit.  His blood pressure readings have mostly been in the 120s/70s to 80s range, with occasional readings in the 140s and one reading of 151/70 at the end of August. He does not recall any specific events that might have caused the elevated readings around Labor Day.  His fasting blood sugar levels have been mostly in the range of 130 to 140, with some readings as high as 163 and 165 in September. He acknowledges a love for sweets and admits to not doing much exercise due to pain.  He describes the pain as a dull ache, primarily located in his legs, which he likens to 'just like I've been overdoing it'. The pain worsens with movement and improves with rest, relieved by sitting down, but it prevents him from exercising. He feels weak and 'draggy' at times.  He is currently taking meloxicam  daily for arthritis. He has not tried any other arthritis medications recently.          03/01/2024    8:24 AM 07/09/2023   10:11 AM 05/05/2023    9:37 AM  Depression screen PHQ 2/9  Decreased Interest 0 0 0  Down, Depressed, Hopeless 0 0 0  PHQ - 2 Score 0 0 0  Altered sleeping 0 0 0  Tired, decreased energy 1 1 0  Change in appetite 0 0 0  Feeling bad or failure about yourself  0 0 0  Trouble concentrating 0 0 0  Moving slowly or fidgety/restless 0 0 0  Suicidal thoughts 0 0 0  PHQ-9 Score 1 1 0  Difficult doing work/chores Not difficult at all  Not difficult at all    History Jared Norman has a past medical history of Arthritis, BPH (benign prostatic hyperplasia), Cataract, Gallstones,  Hyperlipidemia, Hypertension, Kidney stones, Type 2 diabetes mellitus (HCC), and Vitamin D  deficiency.   He has a past surgical history that includes Cystoscopy with retrograde pyelogram, ureteroscopy and stent placement (Left, 12/20/2013); Foot surgery (Right, 2008); Laparoscopic cholecystectomy (05/2018); and Cataract extraction, bilateral.   His family history includes ALS in his brother; Bladder Cancer in his father; Blindness in his father and paternal grandfather; Diabetes in his brother; Hypertension in his brother.He reports that he has never smoked. He has never used smokeless tobacco. He reports that he does not drink alcohol and does not use drugs.    ROS Review of Systems  Constitutional:  Negative for fever.  Respiratory:  Negative for shortness of breath.   Cardiovascular:  Negative for chest pain.  Musculoskeletal:  Negative for arthralgias.  Skin:  Negative for rash.    Objective:  BP 139/77   Pulse 70   Temp 97.8 F (36.6 C)   Ht 5' 3 (1.6 m)   Wt 173 lb 3.2 oz (78.6 kg)   SpO2 98%   BMI 30.68 kg/m   BP Readings from Last 3 Encounters:  03/01/24 139/77  11/27/23 (!) 165/68  11/17/23 134/74    Wt Readings from Last 3 Encounters:  03/01/24 173 lb 3.2 oz (78.6 kg)  11/27/23 173 lb (78.5 kg)  08/28/23  170 lb (77.1 kg)     Physical Exam Physical Exam GENERAL: Alert, cooperative, well developed, no acute distress HEENT: Normocephalic, normal oropharynx, moist mucous membranes CHEST: Clear to auscultation bilaterally, No wheezes, rhonchi, or crackles CARDIOVASCULAR: Normal heart rate and rhythm, S1 and S2 normal without murmurs ABDOMEN: Soft, non-tender, non-distended, without organomegaly, Normal bowel sounds EXTREMITIES: No cyanosis or edema NEUROLOGICAL: Cranial nerves grossly intact, Moves all extremities without gross motor or sensory deficit   Assessment & Plan:  Diabetes mellitus without complication (HCC) -     Vitamin B12 -     CMP14+EGFR -      Bayer DCA Hb A1c Waived  Mixed hyperlipidemia -     Lipid panel  Primary hypertension -     CBC with Differential/Platelet  Other orders -     Nabumetone; Take 2 tablets (1,000 mg total) by mouth 2 (two) times daily. For muscle and joint pain Always with food.  Dispense: 360 tablet; Refill: 1    Assessment and Plan Assessment & Plan Type 2 diabetes mellitus   Blood glucose levels vary, with fasting readings between 106 and 165 mg/dL. He enjoys sweets and exercises little due to pain, contributing to this variability. Encourage dietary modifications to reduce sugar intake. Discuss the importance of regular exercise and explore pain management options to facilitate increased physical activity.  Chronic pain likely due to osteoarthritis   He experiences chronic leg pain, described as an ache worsened by movement and relieved by rest. Meloxicam  provides inadequate relief. Pain characteristics suggest arthritis, though tendinitis or bursitis are considered. Switch from meloxicam  to nabumetone for pain management, given its potential effectiveness and stable kidney function. Monitor kidney function closely due to potential NSAID impact.  Primary hypertension   Blood pressure is generally well-controlled, with most readings in the 120s/70s range. An occasional elevated reading, peaking at 151/87 mmHg in late August, is likely an outlier. Continue the current antihypertensive regimen and encourage regular home blood pressure monitoring.  Chronic kidney disease, stage 3   Kidney function is approximately 50% as of September.       Follow-up: Return in about 3 months (around 06/01/2024).  Butler Der, M.D.

## 2024-03-02 ENCOUNTER — Other Ambulatory Visit: Payer: Self-pay | Admitting: Family Medicine

## 2024-03-02 LAB — CMP14+EGFR
ALT: 32 IU/L (ref 0–44)
AST: 25 IU/L (ref 0–40)
Albumin: 4.5 g/dL (ref 3.7–4.7)
Alkaline Phosphatase: 84 IU/L (ref 48–129)
BUN/Creatinine Ratio: 19 (ref 10–24)
BUN: 36 mg/dL — ABNORMAL HIGH (ref 8–27)
Bilirubin Total: 1 mg/dL (ref 0.0–1.2)
CO2: 24 mmol/L (ref 20–29)
Calcium: 9.7 mg/dL (ref 8.6–10.2)
Chloride: 102 mmol/L (ref 96–106)
Creatinine, Ser: 1.87 mg/dL — ABNORMAL HIGH (ref 0.76–1.27)
Globulin, Total: 2.1 g/dL (ref 1.5–4.5)
Glucose: 117 mg/dL — ABNORMAL HIGH (ref 70–99)
Potassium: 5.3 mmol/L — ABNORMAL HIGH (ref 3.5–5.2)
Sodium: 140 mmol/L (ref 134–144)
Total Protein: 6.6 g/dL (ref 6.0–8.5)
eGFR: 35 mL/min/1.73 — ABNORMAL LOW (ref >59)

## 2024-03-02 LAB — CBC WITH DIFFERENTIAL/PLATELET
Basophils Absolute: 0.1 x10E3/uL (ref 0.0–0.2)
Basos: 1 %
EOS (ABSOLUTE): 0.2 x10E3/uL (ref 0.0–0.4)
Eos: 3 %
Hematocrit: 46.9 % (ref 37.5–51.0)
Hemoglobin: 15.2 g/dL (ref 13.0–17.7)
Immature Grans (Abs): 0 x10E3/uL (ref 0.0–0.1)
Immature Granulocytes: 0 %
Lymphocytes Absolute: 2.5 x10E3/uL (ref 0.7–3.1)
Lymphs: 34 %
MCH: 31.6 pg (ref 26.6–33.0)
MCHC: 32.4 g/dL (ref 31.5–35.7)
MCV: 98 fL — ABNORMAL HIGH (ref 79–97)
Monocytes Absolute: 0.8 x10E3/uL (ref 0.1–0.9)
Monocytes: 11 %
Neutrophils Absolute: 3.7 x10E3/uL (ref 1.4–7.0)
Neutrophils: 51 %
Platelets: 217 x10E3/uL (ref 150–450)
RBC: 4.81 x10E6/uL (ref 4.14–5.80)
RDW: 13.2 % (ref 11.6–15.4)
WBC: 7.3 x10E3/uL (ref 3.4–10.8)

## 2024-03-02 LAB — LIPID PANEL
Chol/HDL Ratio: 2.2 ratio (ref 0.0–5.0)
Cholesterol, Total: 119 mg/dL (ref 100–199)
HDL: 54 mg/dL (ref >39)
LDL Chol Calc (NIH): 49 mg/dL (ref 0–99)
Triglycerides: 81 mg/dL (ref 0–149)
VLDL Cholesterol Cal: 16 mg/dL (ref 5–40)

## 2024-03-02 LAB — VITAMIN B12: Vitamin B-12: 650 pg/mL (ref 232–1245)

## 2024-03-02 MED ORDER — NABUMETONE 500 MG PO TABS: 500.0000 mg | ORAL_TABLET | Freq: Two times a day (BID) | ORAL | 1 refills | Status: DC

## 2024-03-04 ENCOUNTER — Encounter: Payer: Self-pay | Admitting: Family Medicine

## 2024-03-17 ENCOUNTER — Encounter (INDEPENDENT_AMBULATORY_CARE_PROVIDER_SITE_OTHER): Admitting: Ophthalmology

## 2024-03-17 DIAGNOSIS — E113513 Type 2 diabetes mellitus with proliferative diabetic retinopathy with macular edema, bilateral: Secondary | ICD-10-CM

## 2024-03-17 DIAGNOSIS — Z7984 Long term (current) use of oral hypoglycemic drugs: Secondary | ICD-10-CM

## 2024-03-17 DIAGNOSIS — I10 Essential (primary) hypertension: Secondary | ICD-10-CM | POA: Diagnosis not present

## 2024-03-17 DIAGNOSIS — H35033 Hypertensive retinopathy, bilateral: Secondary | ICD-10-CM | POA: Diagnosis not present

## 2024-03-17 DIAGNOSIS — H43813 Vitreous degeneration, bilateral: Secondary | ICD-10-CM

## 2024-04-02 ENCOUNTER — Other Ambulatory Visit: Payer: Self-pay | Admitting: Family Medicine

## 2024-04-12 ENCOUNTER — Ambulatory Visit: Payer: Self-pay

## 2024-04-12 NOTE — Telephone Encounter (Signed)
 Appt made.

## 2024-04-12 NOTE — Telephone Encounter (Signed)
 FYI Only or Action Required?: Action required by provider: Chest xray order.  Patient was last seen in primary care on 03/01/2024 by Zollie Lowers, MD.  Called Nurse Triage reporting No chief complaint on file..  Symptoms began several days ago.  Interventions attempted: Rest, hydration, or home remedies.  Symptoms are: stable.  Triage Disposition: Call PCP When Office is Open  Patient/caregiver understands and will follow disposition?: Yes Reason for Disposition  [1] Caller requesting NON-URGENT health information AND [2] PCP's office is the best resource  Answer Assessment - Initial Assessment Questions Vcue scan scheduled for tomorrow at 2pm,  protocol states needs cxr at least 24 hours prior to getting this scan. Patient needs an order from PCP for a chest xray to be completed. Can an order be sent to [facility]ology department at [facility]. Please advise. Patient's wife would also like a call back.   1. REASON FOR CALL: What is the main reason for your call? or How can I best help you?     Patient's wife Jori in today and is adamant on speaking with the nurse in the PCP office. Patient recently seen in ED for throwing up every hour, chest tightness. Cardiac workup done, clear. CT scan of lower abd done and said they saw blood clots and wanted to patient to come back tomorrow for lung scan  2. SYMPTOMS : Do you have any symptoms?      Much better, feels like he wants to throw up sometimes and chest tightness.  Protocols used: Information Only Call - No Triage-A-AH  Copied from CRM [id number]. Topic: Clinical - Red Word Triage >> Apr 12, 2024  1:36 PM Brittney F wrote: [facility] that prompted transfer to Nurse Triage:   Concern: nausea, chest heaviness  Symptoms:  When he eats he feels the food gets stuck  When did the symptoms start?:  What have you done to aid in the concern ? Have you taken anything to assist with the matter?: Yes  If so, what did you  take?: Zofran  and baby aspirin   Wanted to let you know I will be transferring you to further discuss your concern. Please be advised the nurse can assist with scheduling.

## 2024-04-13 NOTE — Telephone Encounter (Signed)
 Already discussed with E[facility] and relayed to patient the UC could do imaging today. Pt has apt tomorrow. LS

## 2024-04-13 NOTE — Telephone Encounter (Unsigned)
 Copied from CRM #[id number]. Topic: Clinical - Request for Lab/Test Order >> Apr 13, 2024  9:03 AM Graeme ORN wrote: Reason for CRM: Patient wife called. States she called yesterday but has not heard back. Requesting call back from provider. States patient was seen at ED. Had Chest xray then but needs another one because he is scheduled to have a VQ scan this afternoon at 2pm and protocol is that he has to have chest xray within 24 hours of scan. Called [facility] - said put patient in for appt today - no appt showing within the time frame. Please call them back. Thank You

## 2024-04-14 ENCOUNTER — Ambulatory Visit: Admitting: Family Medicine

## 2024-04-14 ENCOUNTER — Ambulatory Visit: Payer: Self-pay | Admitting: Family Medicine

## 2024-04-14 ENCOUNTER — Encounter: Payer: Self-pay | Admitting: Family Medicine

## 2024-04-14 VITALS — BP 89/60 | HR 86 | Temp 98.2°F | Ht 63.0 in | Wt 168.0 lb

## 2024-04-14 DIAGNOSIS — R197 Diarrhea, unspecified: Secondary | ICD-10-CM

## 2024-04-14 DIAGNOSIS — E1169 Type 2 diabetes mellitus with other specified complication: Secondary | ICD-10-CM

## 2024-04-14 DIAGNOSIS — K219 Gastro-esophageal reflux disease without esophagitis: Secondary | ICD-10-CM

## 2024-04-14 DIAGNOSIS — R1319 Other dysphagia: Secondary | ICD-10-CM

## 2024-04-14 DIAGNOSIS — R110 Nausea: Secondary | ICD-10-CM

## 2024-04-14 DIAGNOSIS — E860 Dehydration: Secondary | ICD-10-CM

## 2024-04-14 DIAGNOSIS — R0789 Other chest pain: Secondary | ICD-10-CM

## 2024-04-14 LAB — CMP14+EGFR
ALT: 33 IU/L (ref 0–44)
AST: 33 IU/L (ref 0–40)
Albumin: 4.6 g/dL (ref 3.7–4.7)
Alkaline Phosphatase: 85 IU/L (ref 48–129)
BUN/Creatinine Ratio: 18 (ref 10–24)
BUN: 31 mg/dL — ABNORMAL HIGH (ref 8–27)
Bilirubin Total: 1.3 mg/dL — ABNORMAL HIGH (ref 0.0–1.2)
CO2: 24 mmol/L (ref 20–29)
Calcium: 9.7 mg/dL (ref 8.6–10.2)
Chloride: 96 mmol/L (ref 96–106)
Creatinine, Ser: 1.7 mg/dL — ABNORMAL HIGH (ref 0.76–1.27)
Globulin, Total: 2.7 g/dL (ref 1.5–4.5)
Glucose: 128 mg/dL — ABNORMAL HIGH (ref 70–99)
Potassium: 4.4 mmol/L (ref 3.5–5.2)
Sodium: 138 mmol/L (ref 134–144)
Total Protein: 7.3 g/dL (ref 6.0–8.5)
eGFR: 39 mL/min/1.73 — ABNORMAL LOW (ref >59)

## 2024-04-14 LAB — MICROSCOPIC EXAMINATION
Bacteria, UA: NONE SEEN
Epithelial Cells (non renal): NONE SEEN /HPF (ref 0–10)
RBC, Urine: NONE SEEN /HPF (ref 0–2)
Renal Epithel, UA: NONE SEEN /HPF
WBC, UA: NONE SEEN /HPF (ref 0–5)

## 2024-04-14 LAB — CBC WITH DIFFERENTIAL/PLATELET
Basophils Absolute: 0.1 x10E3/uL (ref 0.0–0.2)
Basos: 1 %
EOS (ABSOLUTE): 0.1 x10E3/uL (ref 0.0–0.4)
Eos: 2 %
Hematocrit: 47.3 % (ref 37.5–51.0)
Hemoglobin: 15.8 g/dL (ref 13.0–17.7)
Immature Grans (Abs): 0 x10E3/uL (ref 0.0–0.1)
Immature Granulocytes: 0 %
Lymphocytes Absolute: 2.3 x10E3/uL (ref 0.7–3.1)
Lymphs: 28 %
MCH: 32.5 pg (ref 26.6–33.0)
MCHC: 33.4 g/dL (ref 31.5–35.7)
MCV: 97 fL (ref 79–97)
Monocytes Absolute: 0.8 x10E3/uL (ref 0.1–0.9)
Monocytes: 10 %
Neutrophils Absolute: 5 x10E3/uL (ref 1.4–7.0)
Neutrophils: 59 %
Platelets: 234 x10E3/uL (ref 150–450)
RBC: 4.86 x10E6/uL (ref 4.14–5.80)
RDW: 12.8 % (ref 11.6–15.4)
WBC: 8.5 x10E3/uL (ref 3.4–10.8)

## 2024-04-14 LAB — URINALYSIS, COMPLETE
Glucose, UA: NEGATIVE
Leukocytes,UA: NEGATIVE
Nitrite, UA: NEGATIVE
RBC, UA: NEGATIVE
Specific Gravity, UA: 1.02 (ref 1.005–1.030)
Urobilinogen, Ur: 0.2 mg/dL (ref 0.2–1.0)
pH, UA: 6.5 (ref 5.0–7.5)

## 2024-04-14 MED ORDER — ONDANSETRON 4 MG PO TBDP: 4.0000 mg | ORAL_TABLET | Freq: Three times a day (TID) | ORAL | 1 refills | Status: AC | PRN

## 2024-04-14 MED ORDER — SUCRALFATE 1 G PO TABS: 1.0000 g | ORAL_TABLET | Freq: Three times a day (TID) | ORAL | 0 refills | Status: AC

## 2024-04-14 NOTE — Progress Notes (Signed)
 Subjective:  Patient ID: Jared Norman, male    DOB: 1941-01-07  Age: 83 y.o. MRN: [id number]  CC: Emesis (Vomitting every hour Friday and Friday night. Doing better but still fatigued. ), imaging (VQ scan #[phone]/Chest pain with the vomiting. They did a full cardiac work up. Blood clot were shown in CT scan of lower abdomin. Lungs were checked turning VQ scan and they have not received results yet.), Blood Sugar Problem (Spiked to 300 during the vomiting but evened out now.  ), and appetite  (Loss of appetiet and hurts to swallow in chest. )   HPI  Discussed the use of AI scribe software for clinical note transcription with the patient, who gave verbal consent to proceed.  History of Present Illness Jared Norman is an 83 year old male with diabetes and coronary artery disease who presents with persistent nausea and vomiting. Jared Norman is accompanied by his wife.  Jared Norman began experiencing symptoms on the evening of Thanksgiving, approximately a week ago. Initially, Jared Norman felt unwell and began vomiting the following day after consuming a large glass of chocolate milk. Jared Norman experienced chest pain prior to vomiting, which was relieved after vomiting. Vomiting continued throughout the night, with the vomitus resembling chocolate milk. By the next morning, Jared Norman had no appetite and experienced discomfort when swallowing. Attempts to eat oatmeal and chicken noodle soup resulted in further vomiting.  Due to persistent symptoms and dehydration, Jared Norman was taken to the emergency room where a cardiac workup, including an EKG, chest x-ray, and troponin levels, was performed. Jared Norman received IV fluids, baby aspirin , and Zofran . A CT scan of the abdomen revealed blood clots, prompting a VQ scan to be scheduled. However, logistical issues delayed the VQ scan due to the need for a recent chest x-ray. Multiple records from that encounter were reviewed. VQ report was faxed to me after phone inquiry.   Jared Norman reports feeling better  today, with less pressure when swallowing and improved tolerance to breakfast. Jared Norman has been prescribed ondansetron  4 mg as needed for nausea, which Jared Norman began taking yesterday. His wife notes that Jared Norman often moans when swallowing, indicating discomfort.  His past medical history includes diabetes, with elevated blood sugar levels noted during the recent episode, reaching 300 mg/dL on Saturday morning. His blood pressure has been variable, with recent readings of 113/70 mmHg and lower at home. Jared Norman has a known right bundle branch block and a small hiatal hernia.  Jared Norman has a history of coronary artery disease with calcium  deposits noted in his coronary arteries. His kidney function is described as weak. Jared Norman has a history of a hiatal hernia and has experienced similar symptoms in the past, including difficulty swallowing and a sensation of food getting stuck, particularly with bread.  No current belly pain, but Jared Norman experiences pressure in the chest when swallowing, particularly with liquids. No current nausea or queasiness.            04/14/2024    9:21 AM 03/01/2024    8:24 AM 07/09/2023   10:11 AM  Depression screen PHQ 2/9  Decreased Interest 0 0 0  Down, Depressed, Hopeless 0 0 0  PHQ - 2 Score 0 0 0  Altered sleeping 0 0 0  Tired, decreased energy 2 1 1   Change in appetite 0 0 0  Feeling bad or failure about yourself  0 0 0  Trouble concentrating 0 0 0  Moving slowly or fidgety/restless 0 0 0  Suicidal thoughts 0 0 0  PHQ-9 Score 2 1  1    Difficult doing work/chores Not difficult at all Not difficult at all      Data saved with a previous flowsheet row definition    History Jared Norman has a past medical history of Arthritis, BPH (benign prostatic hyperplasia), Cataract, Gallstones, Hyperlipidemia, Hypertension, Kidney stones, Type 2 diabetes mellitus (HCC), and Vitamin D  deficiency.   Jared Norman has a past surgical history that includes Cystoscopy with retrograde pyelogram, ureteroscopy and stent  placement (Left, 12/20/2013); Foot surgery (Right, 2008); Laparoscopic cholecystectomy (05/2018); and Cataract extraction, bilateral.   His family history includes ALS in his brother; Bladder Cancer in his father; Blindness in his father and paternal grandfather; Diabetes in his brother; Hypertension in his brother.Jared Norman reports that Jared Norman has never smoked. Jared Norman has never used smokeless tobacco. Jared Norman reports that Jared Norman does not drink alcohol and does not use drugs.    ROS Review of Systems  Constitutional: Negative.   HENT: Negative.    Eyes:  Negative for visual disturbance.  Respiratory:  Negative for cough and shortness of breath.   Cardiovascular:  Negative for chest pain and leg swelling.  Gastrointestinal:  Positive for abdominal pain, diarrhea and nausea. Negative for blood in stool and vomiting.  Genitourinary:  Negative for difficulty urinating.  Musculoskeletal:  Negative for arthralgias and myalgias.  Skin:  Negative for rash.  Neurological:  Negative for headaches.  Psychiatric/Behavioral:  Negative for sleep disturbance.     Objective:  BP (!) 89/60   Pulse 86   Temp 98.2 F (36.8 C)   Ht 5' 3 (1.6 m)   Wt 168 lb (76.2 kg)   SpO2 100%   BMI 29.76 kg/m   BP Readings from Last 3 Encounters:  04/14/24 (!) 89/60  03/01/24 139/77  11/27/23 (!) 165/68    Wt Readings from Last 3 Encounters:  04/14/24 168 lb (76.2 kg)  03/01/24 173 lb 3.2 oz (78.6 kg)  11/27/23 173 lb (78.5 kg)     Physical Exam Constitutional:      Appearance: Jared Norman is ill-appearing (malaise).  HENT:     Right Ear: Tympanic membrane normal.     Left Ear: Tympanic membrane normal.     Nose: Congestion present. No rhinorrhea.     Mouth/Throat:     Mouth: Mucous membranes are moist.  Abdominal:     General: There is no distension.     Palpations: Abdomen is soft. There is no mass.     Tenderness: There is abdominal tenderness (mild, diffuse). There is no guarding or rebound.     Hernia: No hernia is  present.  Musculoskeletal:        General: Normal range of motion.  Skin:    General: Skin is warm and dry.     Findings: No rash.    Physical Exam GENERAL: Alert, cooperative, well developed, no acute distress. HEENT: Normocephalic, normal oropharynx, moist mucous membranes. CHEST: Clear to auscultation bilaterally, no wheezes, rhonchi, or crackles. CARDIOVASCULAR: Normal heart rate and rhythm, S1 and S2 normal without murmurs. ABDOMEN:  without organomegaly, normal bowel sounds. EXTREMITIES: No cyanosis or edema. NEUROLOGICAL: Cranial nerves grossly intact, moves all extremities without gross motor or sensory deficit.   Assessment & Plan:  Other chest pain -     CBC with Differential/Platelet -     CMP14+EGFR -     Urinalysis, Complete -     Sucralfate ; Take 1 tablet (1 g total) by mouth 4 (four) times daily -  with meals and at  bedtime. Crush and mix with water to make a slurry, then swallow  Dispense: 120 tablet; Refill: 0  Diabetic lipidosis (HCC) -     CBC with Differential/Platelet -     CMP14+EGFR -     Urinalysis, Complete  Dehydration -     CBC with Differential/Platelet -     CMP14+EGFR -     Urinalysis, Complete -     Microscopic Examination  Esophageal dysphagia -     CBC with Differential/Platelet -     CMP14+EGFR -     Urinalysis, Complete -     Sucralfate ; Take 1 tablet (1 g total) by mouth 4 (four) times daily -  with meals and at bedtime. Crush and mix with water to make a slurry, then swallow  Dispense: 120 tablet; Refill: 0  Gastroesophageal reflux disease without esophagitis -     CBC with Differential/Platelet -     CMP14+EGFR -     Urinalysis, Complete -     Sucralfate ; Take 1 tablet (1 g total) by mouth 4 (four) times daily -  with meals and at bedtime. Crush and mix with water to make a slurry, then swallow  Dispense: 120 tablet; Refill: 0  Diarrhea of presumed infectious origin  Nausea -     Ondansetron ; Take 1 tablet (4 mg total) by mouth  every 8 (eight) hours as needed.  Dispense: 20 tablet; Refill: 1    Assessment and Plan Assessment & Plan Acute gastritis with vomiting   Acute gastritis likely due to acid reflux, presenting with vomiting and chest pain. Symptoms have improved with ondansetron  and hydration. Troponins were normal, ruling out myocardial infarction. CT scan of the abdomen was normal. VQ scan showed normal perfusion, ruling out pulmonary embolism. Continue ondansetron  as needed for nausea. Maintain a bland diet and ensure adequate hydration. Blood work ordered to check kidney function and bilirubin levels, along with a urine specimen.  Hiatal hernia with intermittent dysphagia   A small hiatal hernia was identified, not believed to be the cause of vomiting or discomfort. Intermittent dysphagia is possibly related to reflux and esophageal irritation. Symptoms have improved. Consider sucralfate  slurry for esophageal protection. Advised taking pills one at a time and with smaller bites of food.  Type 2 diabetes mellitus   Blood sugar was elevated during the ER visit, likely due to stress and vomiting. Current blood sugar levels have stabilized. Continue to monitor blood sugar levels.  Chronic kidney disease, stage 3   Kidney function is well-managed with no significant changes from previous assessments. Blood work ordered to monitor kidney function.  Primary hypertension   Blood pressure was low during the ER visit, likely due to dehydration. Current blood pressure is stable. Continue to monitor blood pressure.  Coronary artery calcification   Calcium  present in coronary arteries, noted as age-related. No acute cardiac issues identified. Continue monitoring cardiac health.  Right bundle branch block   Noted, considered harmless unless combined with other blocks. Continue monitoring cardiac health.  52 minutes spent in this patient evaluation, more than 1/2 in consultation and planning regarding current illness  and symptoms.      Follow-up: No follow-ups on file.  Butler Der, M.D.

## 2024-04-15 ENCOUNTER — Ambulatory Visit: Payer: Self-pay | Admitting: Family Medicine

## 2024-04-21 ENCOUNTER — Encounter (INDEPENDENT_AMBULATORY_CARE_PROVIDER_SITE_OTHER): Admitting: Ophthalmology

## 2024-04-21 DIAGNOSIS — H35033 Hypertensive retinopathy, bilateral: Secondary | ICD-10-CM | POA: Diagnosis not present

## 2024-04-21 DIAGNOSIS — H43813 Vitreous degeneration, bilateral: Secondary | ICD-10-CM | POA: Diagnosis not present

## 2024-04-21 DIAGNOSIS — E113513 Type 2 diabetes mellitus with proliferative diabetic retinopathy with macular edema, bilateral: Secondary | ICD-10-CM | POA: Diagnosis not present

## 2024-04-21 DIAGNOSIS — I10 Essential (primary) hypertension: Secondary | ICD-10-CM | POA: Diagnosis not present

## 2024-04-21 DIAGNOSIS — Z7984 Long term (current) use of oral hypoglycemic drugs: Secondary | ICD-10-CM

## 2024-04-22 ENCOUNTER — Other Ambulatory Visit: Payer: Self-pay | Admitting: Family Medicine

## 2024-04-22 MED ORDER — LANCETS MISC: 1.0000 | 0 refills | Status: DC

## 2024-04-22 MED ORDER — BLOOD GLUCOSE MONITORING SUPPL DEVI: 1.0000 | Freq: Three times a day (TID) | 0 refills | Status: AC

## 2024-04-22 MED ORDER — BLOOD GLUCOSE TEST VI STRP: 1.0000 | ORAL_STRIP | Freq: Three times a day (TID) | 0 refills | Status: DC

## 2024-04-22 MED ORDER — LANCET DEVICE MISC: 1.0000 | Freq: Three times a day (TID) | 0 refills | Status: AC

## 2024-05-07 ENCOUNTER — Other Ambulatory Visit: Payer: Self-pay | Admitting: Family Medicine

## 2024-05-07 DIAGNOSIS — K219 Gastro-esophageal reflux disease without esophagitis: Secondary | ICD-10-CM

## 2024-05-12 ENCOUNTER — Other Ambulatory Visit: Payer: Self-pay | Admitting: Family Medicine

## 2024-05-20 ENCOUNTER — Other Ambulatory Visit: Payer: Self-pay | Admitting: Family Medicine

## 2024-05-20 DIAGNOSIS — M7731 Calcaneal spur, right foot: Secondary | ICD-10-CM

## 2024-05-21 ENCOUNTER — Other Ambulatory Visit: Payer: Self-pay | Admitting: Family Medicine

## 2024-05-26 ENCOUNTER — Encounter (INDEPENDENT_AMBULATORY_CARE_PROVIDER_SITE_OTHER): Admitting: Ophthalmology

## 2024-05-26 DIAGNOSIS — E113513 Type 2 diabetes mellitus with proliferative diabetic retinopathy with macular edema, bilateral: Secondary | ICD-10-CM | POA: Diagnosis not present

## 2024-05-26 DIAGNOSIS — Z7984 Long term (current) use of oral hypoglycemic drugs: Secondary | ICD-10-CM

## 2024-05-26 DIAGNOSIS — H35033 Hypertensive retinopathy, bilateral: Secondary | ICD-10-CM

## 2024-05-26 DIAGNOSIS — H43813 Vitreous degeneration, bilateral: Secondary | ICD-10-CM

## 2024-05-26 DIAGNOSIS — I10 Essential (primary) hypertension: Secondary | ICD-10-CM | POA: Diagnosis not present

## 2024-06-03 ENCOUNTER — Ambulatory Visit: Payer: Self-pay | Admitting: Family Medicine

## 2024-06-03 ENCOUNTER — Encounter: Payer: Self-pay | Admitting: Family Medicine

## 2024-06-03 VITALS — BP 133/76 | HR 74 | Temp 97.3°F | Ht 63.0 in | Wt 171.0 lb

## 2024-06-03 DIAGNOSIS — I7090 Unspecified atherosclerosis: Secondary | ICD-10-CM

## 2024-06-03 DIAGNOSIS — I10 Essential (primary) hypertension: Secondary | ICD-10-CM

## 2024-06-03 DIAGNOSIS — E1169 Type 2 diabetes mellitus with other specified complication: Secondary | ICD-10-CM | POA: Diagnosis not present

## 2024-06-03 DIAGNOSIS — E782 Mixed hyperlipidemia: Secondary | ICD-10-CM

## 2024-06-03 DIAGNOSIS — E756 Lipid storage disorder, unspecified: Secondary | ICD-10-CM | POA: Diagnosis not present

## 2024-06-03 DIAGNOSIS — E860 Dehydration: Secondary | ICD-10-CM

## 2024-06-03 DIAGNOSIS — E119 Type 2 diabetes mellitus without complications: Secondary | ICD-10-CM

## 2024-06-03 LAB — CBC WITH DIFFERENTIAL/PLATELET
Basophils Absolute: 0.1 x10E3/uL (ref 0.0–0.2)
Basos: 1 %
EOS (ABSOLUTE): 0.2 x10E3/uL (ref 0.0–0.4)
Eos: 2 %
Hematocrit: 44.2 % (ref 37.5–51.0)
Hemoglobin: 14.6 g/dL (ref 13.0–17.7)
Immature Grans (Abs): 0 x10E3/uL (ref 0.0–0.1)
Immature Granulocytes: 0 %
Lymphocytes Absolute: 2.6 x10E3/uL (ref 0.7–3.1)
Lymphs: 25 %
MCH: 31.8 pg (ref 26.6–33.0)
MCHC: 33 g/dL (ref 31.5–35.7)
MCV: 96 fL (ref 79–97)
Monocytes Absolute: 1.1 x10E3/uL — ABNORMAL HIGH (ref 0.1–0.9)
Monocytes: 10 %
Neutrophils Absolute: 6.4 x10E3/uL (ref 1.4–7.0)
Neutrophils: 62 %
Platelets: 240 x10E3/uL (ref 150–450)
RBC: 4.59 x10E6/uL (ref 4.14–5.80)
RDW: 12.2 % (ref 11.6–15.4)
WBC: 10.4 x10E3/uL (ref 3.4–10.8)

## 2024-06-03 LAB — COMPREHENSIVE METABOLIC PANEL WITH GFR
ALT: 24 IU/L (ref 0–44)
AST: 25 IU/L (ref 0–40)
Albumin: 4.5 g/dL (ref 3.7–4.7)
Alkaline Phosphatase: 90 IU/L (ref 48–129)
BUN/Creatinine Ratio: 18 (ref 10–24)
BUN: 24 mg/dL (ref 8–27)
Bilirubin Total: 0.7 mg/dL (ref 0.0–1.2)
CO2: 22 mmol/L (ref 20–29)
Calcium: 9.6 mg/dL (ref 8.6–10.2)
Chloride: 102 mmol/L (ref 96–106)
Creatinine, Ser: 1.37 mg/dL — ABNORMAL HIGH (ref 0.76–1.27)
Globulin, Total: 2.3 g/dL (ref 1.5–4.5)
Glucose: 121 mg/dL — ABNORMAL HIGH (ref 70–99)
Potassium: 5 mmol/L (ref 3.5–5.2)
Sodium: 141 mmol/L (ref 134–144)
Total Protein: 6.8 g/dL (ref 6.0–8.5)
eGFR: 51 mL/min/1.73 — ABNORMAL LOW

## 2024-06-03 LAB — URINALYSIS, ROUTINE W REFLEX MICROSCOPIC
Bilirubin, UA: NEGATIVE
Glucose, UA: NEGATIVE
Ketones, UA: NEGATIVE
Leukocytes,UA: NEGATIVE
Nitrite, UA: NEGATIVE
Protein,UA: NEGATIVE
RBC, UA: NEGATIVE
Specific Gravity, UA: 1.02 (ref 1.005–1.030)
Urobilinogen, Ur: 0.2 mg/dL (ref 0.2–1.0)
pH, UA: 7 (ref 5.0–7.5)

## 2024-06-03 LAB — BAYER DCA HB A1C WAIVED: HB A1C (BAYER DCA - WAIVED): 6.6 % — ABNORMAL HIGH (ref 4.8–5.6)

## 2024-06-03 MED ORDER — NABUMETONE 500 MG PO TABS: 1000.0000 mg | ORAL_TABLET | Freq: Two times a day (BID) | ORAL | 1 refills | Status: AC

## 2024-06-06 ENCOUNTER — Encounter: Payer: Self-pay | Admitting: Family Medicine

## 2024-06-06 ENCOUNTER — Other Ambulatory Visit: Payer: Self-pay | Admitting: Family Medicine

## 2024-06-06 NOTE — Progress Notes (Unsigned)
 "  Subjective:  Patient ID: Jared Norman, male    DOB: March 15, 1941  Age: 84 y.o. MRN: [id number]  CC: Medical Management of Chronic Issues (Form for insurnace)   HPI  Discussed the use of AI scribe software for clinical note transcription with the patient, who gave verbal consent to proceed.  History of Present Illness Jared Norman is an 84 year old male with diabetes who presents for follow-up on his blood sugar control and kidney function.  He has been managing his diabetes with recent improvements in his A1c, now at 6.6, down from previous readings in the eights. He has not experienced any significantly low blood sugar readings recently.  He experiences frequent urination, particularly at night, getting up five times, which has been disruptive. He is currently taking finasteride .  He is managing joint pain, previously using meloxicam , which was not effective. He switched to nabumetone , taking two pills twice a day, but initially was on a lower dose. He has not been complaining of back pain as much recently, which was previously attributed to arthritis.  His kidney function is being monitored, with a recent urine test showing clear results, but the blood test results are pending. He maintains hydration, drinking about 75 ounces of water daily.          06/03/2024   10:30 AM 04/14/2024    9:21 AM 03/01/2024    8:24 AM  Depression screen PHQ 2/9  Decreased Interest 0 0 0  Down, Depressed, Hopeless 0 0 0  PHQ - 2 Score 0 0 0  Altered sleeping 0 0 0  Tired, decreased energy 0 2 1  Change in appetite 0 0 0  Feeling bad or failure about yourself  0 0 0  Trouble concentrating 0 0 0  Moving slowly or fidgety/restless 0 0 0  Suicidal thoughts 0 0 0  PHQ-9 Score 0 2 1   Difficult doing work/chores Not difficult at all Not difficult at all Not difficult at all     Data saved with a previous flowsheet row definition    History Jared Norman has a past medical history of Arthritis, BPH  (benign prostatic hyperplasia), Cataract, Gallstones, Hyperlipidemia, Hypertension, Kidney stones, Type 2 diabetes mellitus (HCC), and Vitamin D  deficiency.   He has a past surgical history that includes Cystoscopy with retrograde pyelogram, ureteroscopy and stent placement (Left, 12/20/2013); Foot surgery (Right, 2008); Laparoscopic cholecystectomy (05/2018); and Cataract extraction, bilateral.   His family history includes ALS in his brother; Bladder Cancer in his father; Blindness in his father and paternal grandfather; Diabetes in his brother; Hypertension in his brother.He reports that he has never smoked. He has never used smokeless tobacco. He reports that he does not drink alcohol and does not use drugs.    ROS Review of Systems  Constitutional: Negative.   HENT: Negative.    Eyes:  Negative for visual disturbance.  Respiratory:  Negative for cough and shortness of breath.   Cardiovascular:  Negative for chest pain and leg swelling.  Gastrointestinal:  Negative for abdominal pain, diarrhea, nausea and vomiting.  Genitourinary:  Negative for difficulty urinating.  Musculoskeletal:  Negative for arthralgias and myalgias.  Skin:  Negative for rash.  Neurological:  Negative for headaches.  Psychiatric/Behavioral:  Negative for sleep disturbance.     Objective:  BP 133/76   Pulse 74   Temp (!) 97.3 F (36.3 C)   Ht 5' 3 (1.6 m)   Wt 171 lb (77.6 kg)   SpO2 98%  BMI 30.29 kg/m   BP Readings from Last 3 Encounters:  06/03/24 133/76  04/14/24 (!) 89/60  03/01/24 139/77    Wt Readings from Last 3 Encounters:  06/03/24 171 lb (77.6 kg)  04/14/24 168 lb (76.2 kg)  03/01/24 173 lb 3.2 oz (78.6 kg)     Physical Exam Physical Exam GENERAL: Alert, cooperative, well developed, no acute distress. HEENT: Normocephalic, normal oropharynx, moist mucous membranes. CHEST: Clear to auscultation bilaterally, no wheezes, rhonchi, or crackles. CARDIOVASCULAR: Normal heart rate and  rhythm, S1 and S2 normal without murmurs. ABDOMEN: Soft, non-tender, non-distended, without organomegaly, normal bowel sounds. EXTREMITIES: No cyanosis or edema. NEUROLOGICAL: Cranial nerves grossly intact, moves all extremities without gross motor or sensory deficit.   Assessment & Plan:  Diabetic lipidosis (HCC) -     Bayer DCA Hb A1c Waived  Dehydration -     CBC with Differential/Platelet -     Urinalysis, Routine w reflex microscopic  Atherosclerosis -     CBC with Differential/Platelet  Primary hypertension -     CBC with Differential/Platelet -     Comprehensive metabolic panel with GFR  Mixed hyperlipidemia -     Comprehensive metabolic panel with GFR  Diabetes mellitus without complication (HCC) -     Bayer DCA Hb A1c Waived -     Comprehensive metabolic panel with GFR  Other orders -     Nabumetone ; Take 2 tablets (1,000 mg total) by mouth 2 (two) times daily. For muscle and joint pain Always with food.  Dispense: 360 tablet; Refill: 1    Assessment and Plan Assessment & Plan Type 2 diabetes mellitus   His diabetes is well-controlled with an A1c of 6.6, improved from previous levels in the 8s. No significant hypoglycemic episodes have been reported.  Benign prostatic hyperplasia with lower urinary tract symptoms   Benign prostatic hyperplasia is managed with finasteride  and olmesartan . Nocturia has decreased with reduced fluid intake before bedtime. Proscar  (finasteride ) is expected to be effective. Continue finasteride  and olmesartan . Limit fluid intake after supper to reduce nocturia.  Osteoarthritis   Osteoarthritis is managed with nabumetone , previously switched from meloxicam  due to lack of efficacy. The current regimen is two pills twice a day. Monitor kidney function regularly due to potential impact of NSAIDs. Consider returning to meloxicam  if nabumetone  is ineffective or not tolerated.  Chronic kidney disease, stage 3   Chronic kidney disease stage 3  is monitored with urine tests showing clear results. Awaiting blood test results for kidney function. Adequate hydration is encouraged with a minimum of 64 ounces of water daily, primarily before supper, but fluid intake should be limited after supper to manage nocturia.       Follow-up: Return in about 3 months (around 09/01/2024) for Anxiety.  Butler Der, M.D. "

## 2024-06-07 ENCOUNTER — Ambulatory Visit: Payer: Self-pay | Admitting: Family Medicine

## 2024-06-07 NOTE — Progress Notes (Signed)
Hello Jared Norman,  Your lab result is normal and/or stable.Some minor variations that are not significant are commonly marked abnormal, but do not represent any medical problem for you.  Best regards, Warren Stacks, M.D.

## 2024-06-29 ENCOUNTER — Ambulatory Visit

## 2024-07-07 ENCOUNTER — Encounter (INDEPENDENT_AMBULATORY_CARE_PROVIDER_SITE_OTHER): Admitting: Ophthalmology

## 2024-09-02 ENCOUNTER — Ambulatory Visit: Admitting: Family Medicine
